# Patient Record
Sex: Female | Born: 1950
Health system: Southern US, Community
[De-identification: ages and names within clinical notes are randomized; demographics above are authoritative.]

## PROBLEM LIST (undated history)

## (undated) DIAGNOSIS — F329 Major depressive disorder, single episode, unspecified: Secondary | ICD-10-CM

## (undated) DIAGNOSIS — D649 Anemia, unspecified: Secondary | ICD-10-CM

## (undated) DIAGNOSIS — H269 Unspecified cataract: Secondary | ICD-10-CM

## (undated) DIAGNOSIS — K7689 Other specified diseases of liver: Secondary | ICD-10-CM

## (undated) DIAGNOSIS — M549 Dorsalgia, unspecified: Secondary | ICD-10-CM

## (undated) DIAGNOSIS — K219 Gastro-esophageal reflux disease without esophagitis: Secondary | ICD-10-CM

## (undated) DIAGNOSIS — E785 Hyperlipidemia, unspecified: Secondary | ICD-10-CM

## (undated) DIAGNOSIS — M949 Disorder of cartilage, unspecified: Secondary | ICD-10-CM

## (undated) DIAGNOSIS — K5909 Other constipation: Secondary | ICD-10-CM

## (undated) DIAGNOSIS — I10 Essential (primary) hypertension: Secondary | ICD-10-CM

## (undated) DIAGNOSIS — M899 Disorder of bone, unspecified: Secondary | ICD-10-CM

## (undated) DIAGNOSIS — F411 Generalized anxiety disorder: Secondary | ICD-10-CM

## (undated) DIAGNOSIS — F3289 Other specified depressive episodes: Secondary | ICD-10-CM

## (undated) DIAGNOSIS — E669 Obesity, unspecified: Secondary | ICD-10-CM

## (undated) DIAGNOSIS — J189 Pneumonia, unspecified organism: Secondary | ICD-10-CM

## (undated) HISTORY — DX: Disorder of cartilage, unspecified: M94.9

## (undated) HISTORY — DX: Other specified diseases of liver: K76.89

## (undated) HISTORY — PX: TUBAL LIGATION: SHX77

## (undated) HISTORY — PX: CATARACT EXTRACTION: SUR2

## (undated) HISTORY — PX: APPENDECTOMY: SHX54

## (undated) HISTORY — DX: Dorsalgia, unspecified: M54.9

## (undated) HISTORY — DX: Essential (primary) hypertension: I10

## (undated) HISTORY — PX: OTHER SURGICAL HISTORY: SHX169

## (undated) HISTORY — DX: Other specified depressive episodes: F32.89

## (undated) HISTORY — PX: BREAST BIOPSY: SHX20

## (undated) HISTORY — PX: ABDOMINAL HYSTERECTOMY: SHX81

## (undated) HISTORY — DX: Obesity, unspecified: E66.9

## (undated) HISTORY — DX: Hyperlipidemia, unspecified: E78.5

## (undated) HISTORY — DX: Major depressive disorder, single episode, unspecified: F32.9

## (undated) HISTORY — DX: Pneumonia, unspecified organism: J18.9

## (undated) HISTORY — DX: Disorder of bone, unspecified: M89.9

## (undated) HISTORY — DX: Unspecified cataract: H26.9

## (undated) HISTORY — DX: Gastro-esophageal reflux disease without esophagitis: K21.9

## (undated) HISTORY — PX: CHOLECYSTECTOMY: SHX55

## (undated) HISTORY — DX: Other constipation: K59.09

## (undated) HISTORY — DX: Generalized anxiety disorder: F41.1

## (undated) HISTORY — DX: Anemia, unspecified: D64.9

---

## 1998-04-13 ENCOUNTER — Other Ambulatory Visit: Admission: RE | Admit: 1998-04-13 | Discharge: 1998-04-13 | Payer: Self-pay | Admitting: Obstetrics and Gynecology

## 2000-07-25 ENCOUNTER — Observation Stay (HOSPITAL_COMMUNITY): Admission: RE | Admit: 2000-07-25 | Discharge: 2000-07-26 | Payer: Self-pay | Admitting: Obstetrics and Gynecology

## 2000-07-25 ENCOUNTER — Encounter (INDEPENDENT_AMBULATORY_CARE_PROVIDER_SITE_OTHER): Payer: Self-pay | Admitting: Specialist

## 2001-04-04 ENCOUNTER — Observation Stay (HOSPITAL_COMMUNITY): Admission: EM | Admit: 2001-04-04 | Discharge: 2001-04-05 | Payer: Self-pay | Admitting: Internal Medicine

## 2001-04-04 ENCOUNTER — Encounter (INDEPENDENT_AMBULATORY_CARE_PROVIDER_SITE_OTHER): Payer: Self-pay | Admitting: Specialist

## 2001-04-04 ENCOUNTER — Encounter: Payer: Self-pay | Admitting: Emergency Medicine

## 2001-06-01 ENCOUNTER — Ambulatory Visit (HOSPITAL_BASED_OUTPATIENT_CLINIC_OR_DEPARTMENT_OTHER): Admission: RE | Admit: 2001-06-01 | Discharge: 2001-06-01 | Payer: Self-pay | Admitting: Orthopedic Surgery

## 2003-01-03 ENCOUNTER — Encounter: Payer: Self-pay | Admitting: Internal Medicine

## 2003-01-03 DIAGNOSIS — D126 Benign neoplasm of colon, unspecified: Secondary | ICD-10-CM | POA: Insufficient documentation

## 2003-01-03 HISTORY — PX: UPPER GASTROINTESTINAL ENDOSCOPY: SHX188

## 2003-01-03 HISTORY — PX: COLONOSCOPY W/ BIOPSIES AND POLYPECTOMY: SHX1376

## 2003-01-03 LAB — HM COLONOSCOPY

## 2004-02-14 ENCOUNTER — Emergency Department (HOSPITAL_COMMUNITY): Admission: EM | Admit: 2004-02-14 | Discharge: 2004-02-15 | Payer: Self-pay | Admitting: Emergency Medicine

## 2004-09-28 ENCOUNTER — Emergency Department (HOSPITAL_COMMUNITY): Admission: EM | Admit: 2004-09-28 | Discharge: 2004-09-29 | Payer: Self-pay | Admitting: Emergency Medicine

## 2005-04-27 ENCOUNTER — Ambulatory Visit: Payer: Self-pay | Admitting: Internal Medicine

## 2005-06-14 ENCOUNTER — Ambulatory Visit: Payer: Self-pay | Admitting: Family Medicine

## 2005-08-29 ENCOUNTER — Ambulatory Visit: Payer: Self-pay | Admitting: Internal Medicine

## 2005-09-05 ENCOUNTER — Ambulatory Visit: Payer: Self-pay | Admitting: Professional

## 2005-09-09 ENCOUNTER — Ambulatory Visit: Payer: Self-pay | Admitting: Internal Medicine

## 2005-11-18 ENCOUNTER — Ambulatory Visit: Payer: Self-pay | Admitting: Internal Medicine

## 2005-12-02 ENCOUNTER — Ambulatory Visit: Payer: Self-pay | Admitting: Internal Medicine

## 2005-12-13 ENCOUNTER — Encounter: Admission: RE | Admit: 2005-12-13 | Discharge: 2005-12-13 | Payer: Self-pay | Admitting: Unknown Physician Specialty

## 2006-01-04 ENCOUNTER — Ambulatory Visit (HOSPITAL_COMMUNITY): Admission: RE | Admit: 2006-01-04 | Discharge: 2006-01-04 | Payer: Self-pay | Admitting: Neurological Surgery

## 2006-03-01 ENCOUNTER — Ambulatory Visit: Payer: Self-pay | Admitting: Internal Medicine

## 2006-06-07 ENCOUNTER — Ambulatory Visit: Payer: Self-pay | Admitting: Internal Medicine

## 2006-07-10 ENCOUNTER — Ambulatory Visit: Payer: Self-pay | Admitting: Pulmonary Disease

## 2006-07-27 ENCOUNTER — Ambulatory Visit: Payer: Self-pay | Admitting: Internal Medicine

## 2006-07-27 HISTORY — PX: ELECTROCARDIOGRAM: SHX264

## 2006-08-18 ENCOUNTER — Ambulatory Visit: Payer: Self-pay | Admitting: Internal Medicine

## 2006-08-22 ENCOUNTER — Ambulatory Visit (HOSPITAL_BASED_OUTPATIENT_CLINIC_OR_DEPARTMENT_OTHER): Admission: RE | Admit: 2006-08-22 | Discharge: 2006-08-22 | Payer: Self-pay | Admitting: Pulmonary Disease

## 2006-08-27 ENCOUNTER — Ambulatory Visit: Payer: Self-pay | Admitting: Pulmonary Disease

## 2006-09-15 ENCOUNTER — Ambulatory Visit: Payer: Self-pay | Admitting: Pulmonary Disease

## 2006-09-26 ENCOUNTER — Ambulatory Visit: Payer: Self-pay | Admitting: Internal Medicine

## 2006-10-23 ENCOUNTER — Ambulatory Visit: Payer: Self-pay | Admitting: Pulmonary Disease

## 2006-10-26 ENCOUNTER — Ambulatory Visit: Payer: Self-pay | Admitting: Internal Medicine

## 2006-11-03 ENCOUNTER — Ambulatory Visit: Payer: Self-pay | Admitting: Internal Medicine

## 2006-11-17 ENCOUNTER — Ambulatory Visit: Payer: Self-pay | Admitting: Internal Medicine

## 2007-03-05 ENCOUNTER — Ambulatory Visit: Payer: Self-pay | Admitting: Internal Medicine

## 2007-05-24 ENCOUNTER — Ambulatory Visit: Payer: Self-pay | Admitting: Internal Medicine

## 2007-05-24 LAB — CONVERTED CEMR LAB
ALT: 27 units/L (ref 0–40)
Albumin: 3.4 g/dL — ABNORMAL LOW (ref 3.5–5.2)
BUN: 12 mg/dL (ref 6–23)
Basophils Absolute: 0 10*3/uL (ref 0.0–0.1)
Basophils Relative: 0.4 % (ref 0.0–1.0)
Creatinine, Ser: 0.8 mg/dL (ref 0.4–1.2)
Eosinophils Absolute: 0.1 10*3/uL (ref 0.0–0.6)
Eosinophils Relative: 2.6 % (ref 0.0–5.0)
GFR calc Af Amer: 95 mL/min
Hemoglobin, Urine: NEGATIVE
Hemoglobin: 13.3 g/dL (ref 12.0–15.0)
Leukocytes, UA: NEGATIVE
Lymphocytes Relative: 47.2 % — ABNORMAL HIGH (ref 12.0–46.0)
MCHC: 34.4 g/dL (ref 30.0–36.0)
Monocytes Absolute: 0.3 10*3/uL (ref 0.2–0.7)
RDW: 14.4 % (ref 11.5–14.6)
Total Bilirubin: 0.5 mg/dL (ref 0.3–1.2)
Total Protein, Urine: NEGATIVE mg/dL
Total Protein: 7.2 g/dL (ref 6.0–8.3)
WBC: 4.5 10*3/uL (ref 4.5–10.5)

## 2007-06-05 ENCOUNTER — Ambulatory Visit: Payer: Self-pay | Admitting: Family Medicine

## 2007-07-04 ENCOUNTER — Encounter: Payer: Self-pay | Admitting: Internal Medicine

## 2007-07-04 DIAGNOSIS — E785 Hyperlipidemia, unspecified: Secondary | ICD-10-CM | POA: Insufficient documentation

## 2007-09-01 ENCOUNTER — Ambulatory Visit: Payer: Self-pay | Admitting: Internal Medicine

## 2007-09-14 ENCOUNTER — Encounter: Admission: RE | Admit: 2007-09-14 | Discharge: 2007-09-14 | Payer: Self-pay | Admitting: Obstetrics and Gynecology

## 2007-10-04 ENCOUNTER — Ambulatory Visit: Payer: Self-pay | Admitting: Internal Medicine

## 2007-10-04 ENCOUNTER — Encounter: Payer: Self-pay | Admitting: Internal Medicine

## 2007-10-04 DIAGNOSIS — G4733 Obstructive sleep apnea (adult) (pediatric): Secondary | ICD-10-CM | POA: Insufficient documentation

## 2007-10-04 DIAGNOSIS — M949 Disorder of cartilage, unspecified: Secondary | ICD-10-CM

## 2007-10-04 DIAGNOSIS — F411 Generalized anxiety disorder: Secondary | ICD-10-CM | POA: Insufficient documentation

## 2007-10-04 DIAGNOSIS — M899 Disorder of bone, unspecified: Secondary | ICD-10-CM | POA: Insufficient documentation

## 2007-10-04 DIAGNOSIS — F32A Depression, unspecified: Secondary | ICD-10-CM | POA: Insufficient documentation

## 2007-10-04 DIAGNOSIS — I491 Atrial premature depolarization: Secondary | ICD-10-CM | POA: Insufficient documentation

## 2007-10-04 DIAGNOSIS — F329 Major depressive disorder, single episode, unspecified: Secondary | ICD-10-CM | POA: Insufficient documentation

## 2007-10-04 DIAGNOSIS — I10 Essential (primary) hypertension: Secondary | ICD-10-CM | POA: Insufficient documentation

## 2008-02-15 ENCOUNTER — Encounter: Payer: Self-pay | Admitting: Internal Medicine

## 2008-02-22 ENCOUNTER — Telehealth: Payer: Self-pay | Admitting: Internal Medicine

## 2008-02-23 ENCOUNTER — Emergency Department (HOSPITAL_COMMUNITY): Admission: EM | Admit: 2008-02-23 | Discharge: 2008-02-23 | Payer: Self-pay | Admitting: Family Medicine

## 2008-04-01 ENCOUNTER — Telehealth: Payer: Self-pay | Admitting: Internal Medicine

## 2008-04-14 ENCOUNTER — Encounter: Payer: Self-pay | Admitting: Internal Medicine

## 2008-04-15 ENCOUNTER — Ambulatory Visit: Payer: Self-pay | Admitting: Internal Medicine

## 2008-04-16 ENCOUNTER — Encounter: Payer: Self-pay | Admitting: Internal Medicine

## 2008-04-16 DIAGNOSIS — K7689 Other specified diseases of liver: Secondary | ICD-10-CM | POA: Insufficient documentation

## 2008-04-17 LAB — CONVERTED CEMR LAB
ALT: 113 units/L — ABNORMAL HIGH (ref 0–35)
Basophils Absolute: 0.1 10*3/uL (ref 0.0–0.1)
Basophils Relative: 1.2 % — ABNORMAL HIGH (ref 0.0–1.0)
Bilirubin, Direct: 0.2 mg/dL (ref 0.0–0.3)
CO2: 27 meq/L (ref 19–32)
Calcium: 9.4 mg/dL (ref 8.4–10.5)
Chloride: 105 meq/L (ref 96–112)
Cholesterol: 192 mg/dL (ref 0–200)
Creatinine, Ser: 0.9 mg/dL (ref 0.4–1.2)
Crystals: NEGATIVE
Eosinophils Relative: 2.2 % (ref 0.0–5.0)
GFR calc Af Amer: 83 mL/min
Glucose, Bld: 92 mg/dL (ref 70–99)
HDL: 63.3 mg/dL (ref >39.0)
Hemoglobin, Urine: NEGATIVE
LDL Cholesterol: 101 mg/dL — ABNORMAL HIGH (ref 0–99)
MCHC: 33.4 g/dL (ref 30.0–36.0)
MCV: 91.5 fL (ref 78.0–100.0)
Neutro Abs: 3.3 10*3/uL (ref 1.4–7.7)
Nitrite: NEGATIVE
Platelets: 279 10*3/uL (ref 150–400)
RBC / HPF: NONE SEEN
Sodium: 144 meq/L (ref 135–145)
Total Bilirubin: 0.7 mg/dL (ref 0.3–1.2)
Total Protein: 8 g/dL (ref 6.0–8.3)
Triglycerides: 140 mg/dL (ref 0–149)
Urobilinogen, UA: 0.2 (ref 0.0–1.0)
WBC: 6 10*3/uL (ref 4.5–10.5)

## 2008-04-22 ENCOUNTER — Encounter: Admission: RE | Admit: 2008-04-22 | Discharge: 2008-04-22 | Payer: Self-pay | Admitting: Internal Medicine

## 2008-04-22 ENCOUNTER — Ambulatory Visit: Payer: Self-pay | Admitting: Internal Medicine

## 2008-04-23 ENCOUNTER — Encounter: Payer: Self-pay | Admitting: Internal Medicine

## 2008-04-24 ENCOUNTER — Encounter (INDEPENDENT_AMBULATORY_CARE_PROVIDER_SITE_OTHER): Payer: Self-pay | Admitting: *Deleted

## 2008-04-24 LAB — CONVERTED CEMR LAB
ALT: 123 units/L — ABNORMAL HIGH (ref 0–35)
AST: 189 units/L — ABNORMAL HIGH (ref 0–37)
Albumin: 3.8 g/dL (ref 3.5–5.2)
Total Protein: 7.9 g/dL (ref 6.0–8.3)

## 2008-05-21 ENCOUNTER — Ambulatory Visit: Payer: Self-pay | Admitting: Internal Medicine

## 2008-05-21 DIAGNOSIS — E669 Obesity, unspecified: Secondary | ICD-10-CM | POA: Insufficient documentation

## 2008-05-21 DIAGNOSIS — K5909 Other constipation: Secondary | ICD-10-CM | POA: Insufficient documentation

## 2008-05-21 LAB — CONVERTED CEMR LAB
ALT: 171 units/L — ABNORMAL HIGH (ref 0–35)
AST: 186 units/L — ABNORMAL HIGH (ref 0–37)
Alkaline Phosphatase: 99 units/L (ref 39–117)
Bilirubin, Direct: 0.1 mg/dL (ref 0.0–0.3)

## 2008-05-28 LAB — CONVERTED CEMR LAB: Anti Nuclear Antibody(ANA): NEGATIVE

## 2008-06-13 ENCOUNTER — Ambulatory Visit: Payer: Self-pay | Admitting: Internal Medicine

## 2008-06-16 LAB — CONVERTED CEMR LAB
AST: 124 units/L — ABNORMAL HIGH (ref 0–37)
Albumin: 3.7 g/dL (ref 3.5–5.2)
Bilirubin, Direct: 0.1 mg/dL (ref 0.0–0.3)
Total CK: 105 units/L (ref 7–177)
Total Protein: 7.7 g/dL (ref 6.0–8.3)

## 2008-07-18 ENCOUNTER — Ambulatory Visit: Payer: Self-pay | Admitting: Internal Medicine

## 2008-07-20 LAB — CONVERTED CEMR LAB
ALT: 171 units/L — ABNORMAL HIGH (ref 0–35)
AST: 213 units/L — ABNORMAL HIGH (ref 0–37)
Alkaline Phosphatase: 81 units/L (ref 39–117)
Bilirubin, Direct: 0.1 mg/dL (ref 0.0–0.3)
Cholesterol: 293 mg/dL (ref 0–200)
Glucose, Bld: 100 mg/dL — ABNORMAL HIGH (ref 70–99)
Total CHOL/HDL Ratio: 4.7
VLDL: 24 mg/dL (ref 0–40)

## 2008-07-22 ENCOUNTER — Ambulatory Visit: Payer: Self-pay | Admitting: Internal Medicine

## 2008-07-22 LAB — CONVERTED CEMR LAB
Basophils Relative: 0.5 % (ref 0.0–3.0)
HCT: 40.3 % (ref 36.0–46.0)
MCHC: 34.6 g/dL (ref 30.0–36.0)
Monocytes Absolute: 0.3 10*3/uL (ref 0.1–1.0)
Neutro Abs: 1.9 10*3/uL (ref 1.4–7.7)
Neutrophils Relative %: 44 % (ref 43.0–77.0)
Platelets: 272 10*3/uL (ref 150–400)
Prothrombin Time: 12.8 s (ref 10.9–13.3)
RDW: 14.2 % (ref 11.5–14.6)
aPTT: 30.5 s — ABNORMAL HIGH (ref 21.7–29.8)

## 2008-07-28 ENCOUNTER — Telehealth: Payer: Self-pay | Admitting: Internal Medicine

## 2008-07-28 LAB — CONVERTED CEMR LAB
Albumin ELP: 53.9 % — ABNORMAL LOW (ref 55.8–66.1)
Alpha-2-Globulin: 10.7 % (ref 7.1–11.8)
Gamma Globulin: 17.3 % (ref 11.1–18.8)
Tissue Transglutaminase Ab, IgA: 0.9 units (ref <7)
Total Protein, Serum Electrophoresis: 7.8 g/dL (ref 6.0–8.3)

## 2008-08-27 ENCOUNTER — Ambulatory Visit: Payer: Self-pay | Admitting: Internal Medicine

## 2008-08-28 LAB — CONVERTED CEMR LAB
AST: 173 units/L — ABNORMAL HIGH (ref 0–37)
Total Bilirubin: 0.7 mg/dL (ref 0.3–1.2)

## 2008-09-01 ENCOUNTER — Encounter: Payer: Self-pay | Admitting: Internal Medicine

## 2008-09-08 ENCOUNTER — Encounter: Admission: RE | Admit: 2008-09-08 | Discharge: 2008-09-08 | Payer: Self-pay | Admitting: Obstetrics and Gynecology

## 2008-10-14 ENCOUNTER — Ambulatory Visit: Payer: Self-pay | Admitting: Internal Medicine

## 2008-10-14 DIAGNOSIS — K648 Other hemorrhoids: Secondary | ICD-10-CM | POA: Insufficient documentation

## 2008-10-14 LAB — CONVERTED CEMR LAB: Alkaline Phosphatase: 84 units/L (ref 39–117)

## 2008-10-29 ENCOUNTER — Ambulatory Visit: Payer: Self-pay | Admitting: Internal Medicine

## 2008-10-29 DIAGNOSIS — E039 Hypothyroidism, unspecified: Secondary | ICD-10-CM | POA: Insufficient documentation

## 2008-10-30 LAB — CONVERTED CEMR LAB
Albumin: 3.6 g/dL (ref 3.5–5.2)
Total Bilirubin: 0.7 mg/dL (ref 0.3–1.2)

## 2008-11-10 ENCOUNTER — Encounter (INDEPENDENT_AMBULATORY_CARE_PROVIDER_SITE_OTHER): Payer: Self-pay | Admitting: Interventional Radiology

## 2008-11-10 ENCOUNTER — Encounter: Payer: Self-pay | Admitting: Internal Medicine

## 2008-11-10 ENCOUNTER — Ambulatory Visit (HOSPITAL_COMMUNITY): Admission: RE | Admit: 2008-11-10 | Discharge: 2008-11-10 | Payer: Self-pay | Admitting: Internal Medicine

## 2008-11-17 ENCOUNTER — Telehealth: Payer: Self-pay | Admitting: Internal Medicine

## 2008-11-24 ENCOUNTER — Ambulatory Visit: Payer: Self-pay | Admitting: Internal Medicine

## 2009-02-04 ENCOUNTER — Ambulatory Visit: Payer: Self-pay | Admitting: Internal Medicine

## 2009-02-05 LAB — CONVERTED CEMR LAB
ALT: 45 units/L — ABNORMAL HIGH (ref 0–35)
AST: 64 units/L — ABNORMAL HIGH (ref 0–37)
Albumin: 3.9 g/dL (ref 3.5–5.2)
Alkaline Phosphatase: 76 units/L (ref 39–117)
Total Protein: 8 g/dL (ref 6.0–8.3)

## 2009-02-24 ENCOUNTER — Telehealth: Payer: Self-pay | Admitting: Internal Medicine

## 2009-03-02 ENCOUNTER — Ambulatory Visit: Payer: Self-pay | Admitting: Internal Medicine

## 2009-07-15 ENCOUNTER — Ambulatory Visit: Payer: Self-pay | Admitting: Internal Medicine

## 2009-07-15 ENCOUNTER — Encounter: Payer: Self-pay | Admitting: Internal Medicine

## 2009-07-17 LAB — CONVERTED CEMR LAB
Alkaline Phosphatase: 95 units/L (ref 39–117)
Bilirubin, Direct: 0.1 mg/dL (ref 0.0–0.3)
Total Bilirubin: 0.9 mg/dL (ref 0.3–1.2)

## 2009-09-14 ENCOUNTER — Encounter: Admission: RE | Admit: 2009-09-14 | Discharge: 2009-09-14 | Payer: Self-pay | Admitting: Obstetrics and Gynecology

## 2009-09-22 ENCOUNTER — Ambulatory Visit: Payer: Self-pay | Admitting: Internal Medicine

## 2009-09-22 DIAGNOSIS — M545 Low back pain, unspecified: Secondary | ICD-10-CM | POA: Insufficient documentation

## 2009-09-22 LAB — CONVERTED CEMR LAB
AST: 39 units/L — ABNORMAL HIGH (ref 0–37)
Albumin: 3.7 g/dL (ref 3.5–5.2)
Basophils Relative: 0.4 % (ref 0.0–3.0)
Bilirubin Urine: NEGATIVE
CO2: 31 meq/L (ref 19–32)
Chloride: 100 meq/L (ref 96–112)
Creatinine, Ser: 0.9 mg/dL (ref 0.4–1.2)
GFR calc non Af Amer: 68.06 mL/min (ref >60)
HDL: 57.9 mg/dL (ref >39.00)
Ketones, ur: NEGATIVE mg/dL
Monocytes Absolute: 0.4 10*3/uL (ref 0.1–1.0)
Monocytes Relative: 7.3 % (ref 3.0–12.0)
Neutrophils Relative %: 45.4 % (ref 43.0–77.0)
Nitrite: NEGATIVE
Potassium: 3.5 meq/L (ref 3.5–5.1)
RBC: 4.34 M/uL (ref 3.87–5.11)
Sodium: 136 meq/L (ref 135–145)
Specific Gravity, Urine: <=1.005 (ref 1.000–1.030)
TSH: 3.16 microintl units/mL (ref 0.35–5.50)
Total Bilirubin: 0.7 mg/dL (ref 0.3–1.2)
Total Protein, Urine: NEGATIVE mg/dL
Total Protein: 8.3 g/dL (ref 6.0–8.3)
pH: 7 (ref 5.0–8.0)

## 2009-11-09 ENCOUNTER — Telehealth: Payer: Self-pay | Admitting: Internal Medicine

## 2009-12-28 ENCOUNTER — Telehealth: Payer: Self-pay | Admitting: Internal Medicine

## 2010-06-10 ENCOUNTER — Telehealth: Payer: Self-pay | Admitting: Internal Medicine

## 2010-09-15 ENCOUNTER — Encounter: Admission: RE | Admit: 2010-09-15 | Discharge: 2010-09-15 | Payer: Self-pay | Admitting: Obstetrics and Gynecology

## 2010-09-21 ENCOUNTER — Encounter: Payer: Self-pay | Admitting: Internal Medicine

## 2010-09-21 ENCOUNTER — Ambulatory Visit: Payer: Self-pay | Admitting: Internal Medicine

## 2010-09-21 DIAGNOSIS — L723 Sebaceous cyst: Secondary | ICD-10-CM | POA: Insufficient documentation

## 2010-09-21 DIAGNOSIS — R21 Rash and other nonspecific skin eruption: Secondary | ICD-10-CM | POA: Insufficient documentation

## 2010-09-21 LAB — CONVERTED CEMR LAB
Alkaline Phosphatase: 83 units/L (ref 39–117)
BUN: 13 mg/dL (ref 6–23)
Bilirubin, Direct: 0.1 mg/dL (ref 0.0–0.3)
Cholesterol: 260 mg/dL — ABNORMAL HIGH (ref 0–200)
Direct LDL: 179.7 mg/dL
Eosinophils Absolute: 0.2 10*3/uL (ref 0.0–0.7)
Eosinophils Relative: 3 % (ref 0.0–5.0)
HDL: 61.8 mg/dL (ref >39.00)
Ketones, ur: NEGATIVE mg/dL
Lymphocytes Relative: 36.1 % (ref 12.0–46.0)
Neutro Abs: 3 10*3/uL (ref 1.4–7.7)
Neutrophils Relative %: 53.6 % (ref 43.0–77.0)
Potassium: 4.5 meq/L (ref 3.5–5.1)
Saturation Ratios: 8.8 % — ABNORMAL LOW (ref 20.0–50.0)
Specific Gravity, Urine: >=1.03 (ref 1.000–1.030)
TSH: 3.41 microintl units/mL (ref 0.35–5.50)
Total CHOL/HDL Ratio: 4
Transferrin: 396.9 mg/dL — ABNORMAL HIGH (ref 212.0–360.0)
Triglycerides: 184 mg/dL — ABNORMAL HIGH (ref 0.0–149.0)
Urobilinogen, UA: 0.2 (ref 0.0–1.0)
pH: 5.5 (ref 5.0–8.0)

## 2011-01-20 NOTE — Progress Notes (Signed)
  Phone Note Refill Request Message from:  Fax from Pharmacy on June 10, 2010 2:19 PM  Refills Requested: Medication #1:  HYDROCHLOROTHIAZIDE 25 MG  TABS 1 by mouth once daily   Dosage confirmed as above?Dosage Confirmed   Last Refilled: 09/22/2009   Notes: [facility]  Medication #2:  METOPROLOL SUCCINATE 50 MG TB24 Take 1 tablet by mouth once a day   Dosage confirmed as above?Dosage Confirmed   Last Refilled: 09/22/2009   Notes: [facility]  Medication #3:  CRESTOR 20 MG TABS 1 by mouth once daily   Dosage confirmed as above?Dosage Confirmed   Last Refilled: 09/22/2009   Notes: [facility] Initial call taken by: Robin Ewing CMA (AAMA),  June 10, 2010 2:20 PM    Prescriptions: CRESTOR 20 MG TABS (ROSUVASTATIN CALCIUM) 1 by mouth once daily  #90 x 0   Entered by:   Scharlene Gloss CMA (AAMA)   Authorized by:   Corwin Levins MD   Signed by:   Scharlene Gloss CMA (AAMA) on 06/10/2010   Method used:   Faxed to ...       [facility] (mail-order)             , [place]         Ph: [phone]       Fax: [phone]   RxID:   [id number] METOPROLOL SUCCINATE 50 MG TB24 (METOPROLOL SUCCINATE) Take 1 tablet by mouth once a day  #90 x 0   Entered by:   Zella Ball Ewing CMA (AAMA)   Authorized by:   Corwin Levins MD   Signed by:   Scharlene Gloss CMA (AAMA) on 06/10/2010   Method used:   Faxed to ...       [facility] (mail-order)             , [place]         Ph: [phone]       Fax: [phone]   RxID:   [id number] HYDROCHLOROTHIAZIDE 25 MG  TABS (HYDROCHLOROTHIAZIDE) 1 by mouth once daily  #90 x 0   Entered by:   Zella Ball Ewing CMA (AAMA)   Authorized by:   Corwin Levins MD   Signed by:   Scharlene Gloss CMA (AAMA) on 06/10/2010   Method used:   Faxed to ...       [facility] (mail-[facility])             , [place]         Ph: [phone]       Fax: [phone]   RxID:   [id number]

## 2011-01-20 NOTE — Assessment & Plan Note (Signed)
Summary: YEARLY MEDICARE CPX-FLU SHOT-SHINGLES SHOT-LB   Vital Signs:  Patient profile:   60 year old female Height:      63 inches Weight:      214.50 pounds BMI:     38.13 O2 Sat:      92 % on Room air Temp:     96.5 degrees F oral Pulse rate:   70 / minute BP sitting:   130 / 82  (left arm) Cuff size:   large  Vitals Entered By: Zella Ball Ewing CMA Duncan Dull) (September 21, 2010 8:55 AM)  O2 Flow:  Room air  Preventive Care Screening  Colonoscopy:    Next Due:  01/2013  Bone Density:    Date:  07/15/2009    Next Due:  07/2011    Results:  abnormal std dev  Mammogram:    Date:  09/13/2010    Results:  normal   CC: ADult Physical/RE/wellness   Primary Care Leevon Upperman:  Oliver Barre, MD  CC:  ADult Physical/RE/wellness.  History of Present Illness: here for wellness and f/u -  has ongoing rash to left cheek for one yr that she keeps aggrevating the face with scratching, as well as rash to the mild lower back that gets owrse to the warmer weather and sweating;  trying to be more active lately but no wt loss yet - infact may have gained a few lbs;  Pt denies CP, worsening sob, doe, wheezing, orthopnea, pnd, worsening LE edema, palps, dizziness or syncope  Pt denies new neuro symptoms such as headache, facial or extremity weakness  Pt denies polydipsia, polyuria.  Overall good compliance with meds, trying to follow low chol diet, wt stable, little excercise however   Also with ? small boil near the left axilla, wihtout fever , chills, malaise.  Has occasional nightmares  at night where she will flail, has to be held down as her 'legs keep running"  - only happens 4 times in the pasat 4 yrs.  Also seems to scream but doesnot wake up.  Denies worsening depression, suicidal ideation, or panic.  Last panic attack has been several yrs ago.  Has not been taking the boniva lately - too wary of the side effect.    Here for wellness Diet: Heart Healthy or DM if diabetic Physical Activities:  Sedentary, except for walking occasionally Depression/mood screen: Negative/stable on meds Hearing: Intact bilateral Visual Acuity: Grossly normal, gets exam yearly,   ADL's: Capable  Fall Risk: None Home Safety: Good Cognitive Impairment:  Gen appearance, affect, speech, memory, attention & motor skills grossly intact End-of-Life Planning: Advance directive - Full code/I agree   Problems Prior to Update: 1)  Low Back Pain  (ICD-724.2) 2)  Hypothyroidism  (ICD-244.9) 3)  Rectal Bleeding  (ICD-569.3) 4)  Obesity, Unspecified  (ICD-278.00) 5)  Abnormal Transaminase-lft's  (ICD-790.4) 6)  Constipation, Chronic  (ICD-564.09) 7)  Colonic Polyps, Hyperplastic  (ICD-211.3) 8)  External Hemorrhoids  (ICD-455.3) 9)  Nash (NON-ALCOHOLIC STEATOHEPATITIS)  (ICD-571.8) 10)  Back Pain  (ICD-724.5) 11)  Preventive Health Care  (ICD-V70.0) 12)  Hypertension  (ICD-401.9) 13)  Osteopenia  (ICD-733.90) 14)  Premature Beats, Atrial  (ICD-427.61) 15)  Obstructive Sleep Apnea  (ICD-327.23) 16)  Anxiety  (ICD-300.00) 17)  Depression  (ICD-311) 18)  Family History of Alcoholism/addiction  (ICD-V61.41) 19)  Hyperlipidemia  (ICD-272.4)  Medications Prior to Update: 1)  Effexor Xr 150 Mg  Cp24 (Venlafaxine Hcl) .... Take 1 By Mouth Once Daily 2)  Boniva 150 Mg  Tabs (Ibandronate Sodium) .... Take 1 By Mouth Q Month 3)  Hydrochlorothiazide 25 Mg  Tabs (Hydrochlorothiazide) .Marland Kitchen.. 1 By Mouth Once Daily 4)  Metoprolol Succinate 50 Mg Tb24 (Metoprolol Succinate) .... Take 1 Tablet By Mouth Once A Day 5)  Bayer Aspirin Ec Low Dose 81 Mg  Tbec (Aspirin) .... Take 1 Tablet By Mouth Once A Day 6)  Fish Oil   Oil (Fish Oil) .... Take 1 Tablets By Mouth Once Daily. 7)  Vitamin B-12 1000 Mcg  Tabs (Cyanocobalamin) .... 2  Tablet By Mouth Once Daily 8)  Calcium Carbonate 600 Mg  Tabs (Calcium Carbonate) .Marland Kitchen.. 1 Tablet By Mouth Two Times A Day 9)  Vitamin D 09811 Unit Caps (Ergocalciferol) .Marland Kitchen.. 1 Capsule Every  Week 10)  Acai 500 Mg Caps (Acai) .... Take 2 By Mouth Once Daily 11)  Coq10 Maximum Strength 400 Mg Caps (Coenzyme Q10) .Marland Kitchen.. 1 By Mouth Every Day 12)  Crestor 20 Mg Tabs (Rosuvastatin Calcium) .Marland Kitchen.. 1 By Mouth Once Daily 13)  Alprazolam 0.25 Mg Tabs (Alprazolam) .Marland Kitchen.. 1 By Mouth Two Times A Day As Needed  Current Medications (verified): 1)  Effexor Xr 150 Mg  Cp24 (Venlafaxine Hcl) .... Take 1 By Mouth Once Daily 2)  Hydrochlorothiazide 25 Mg  Tabs (Hydrochlorothiazide) .Marland Kitchen.. 1 By Mouth Once Daily 3)  Metoprolol Succinate 50 Mg Tb24 (Metoprolol Succinate) .... Take 1 Tablet By Mouth Once A Day 4)  Bayer Aspirin Ec Low Dose 81 Mg  Tbec (Aspirin) .... Take 1 Tablet By Mouth Once A Day 5)  Fish Oil   Oil (Fish Oil) .... Take 1 Tablets By Mouth Once Daily. 6)  Calcium Carbonate 600 Mg  Tabs (Calcium Carbonate) .Marland Kitchen.. 1 Tablet By Mouth Two Times A Day 7)  Crestor 20 Mg Tabs (Rosuvastatin Calcium) .Marland Kitchen.. 1 By Mouth Once Daily 8)  Alprazolam 0.25 Mg Tabs (Alprazolam) .Marland Kitchen.. 1 By Mouth Two Times A Day As Needed 9)  Vitamin D3 1000 Unit Caps (Cholecalciferol) .Marland Kitchen.. 1 By Mouth Once Daily 10)  Lotrisone 1-0.05 % Crea (Clotrimazole-Betamethasone) .... Use Asd Two Times A Day As Needed  Allergies (verified): 1)  ! 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   Risk Factors: Exercise: no (05/21/2008)  Risk Factors: Smoking Status: quit (10/04/2007)  Past Medical History: Hyperlipidemia Depression Anxiety OSA symptomatic  PAC's Osteopenia Hypertension Hypothyroidism low vit D NASH (fatty liver) Low back pain - chronic Anemia-NOS Colonic polyps, hx of - benign  MD Roster:  Dr Leone Payor - GI                     Derm - Dr Terri Piedra                    GYN - Dr Elana Alm                    optho - in [place], [place] - Dr Lucinda Dell - Dr Shelle Iron  Past Surgical History: Hysterectomy EGD (01/03/2003)  EKG (07/27/2006) Appendectomy Cholecystectomy Tubal Ligation Trigger finger repair-Left thumb Cataract Surgery-left eye, and right eye s/p lumbar surgury  s/p liver biopsy - Dr Leone Payor  Family History: Reviewed history from 07/22/2008 and no changes required. Family History of Alcoholism/Addiction Family History High cholesterol Family History Hypertension No FH of Colon Cancer Family History of Heart Disease: Father No liver disease  Social History: Reviewed history from 07/22/2008 and no changes required. 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   Review of Systems  The patient denies anorexia, fever, weight loss, weight gain, vision loss, decreased hearing, hoarseness, chest pain, syncope, dyspnea on exertion, peripheral edema, prolonged cough, headaches, hemoptysis, abdominal pain, melena, hematochezia, severe indigestion/heartburn, hematuria, muscle weakness, transient blindness, difficulty walking, depression, unusual weight change, abnormal bleeding, enlarged lymph nodes, and angioedema.         all otherwise negative per pt -  except for fatigue without OSA symtpoms  Physical Exam  General:  alert and overweight-appearing.   Head:  normocephalic and atraumatic.   Eyes:  vision grossly intact, pupils equal, and pupils round.   Ears:  R ear normal and L ear normal.   Nose:  no external deformity and no nasal discharge.    Mouth:  no gingival abnormalities and pharynx pink and moist.   Neck:  supple and no masses.   Lungs:  normal respiratory effort and normal breath sounds.   Heart:  normal rate and regular rhythm.   Abdomen:  soft, non-tender, and normal bowel sounds.   Msk:  no joint tenderness and no joint swelling.   Extremities:  no edema, no erythema  Neurologic:  cranial nerves II-XII intact and strength normal in all extremities.   Skin:  color normal.  excoriated rash to left cheek below the eye, as well as tinea type rash to lower lumbar bilat para vertebral area;  also 1 cm area inflamed seb cyst type lesion to left mid lat chest near the left axilla, tedner but nonfluctuant Psych:  not depressed appearing and moderately anxious.     Impression & Recommendations:  Problem # 1:  Preventive Health Care (ICD-V70.0) Overall doing well, age appropriate education and counseling updated and referral for appropriate preventive services done unless declined, immunizations up to date or declined, diet counseling done if overweight, urged to quit smoking if smokes , most recent labs reviewed and current ordered if appropriate, ecg reviewed or declined (interpretation per ECG scanned in the EMR if done); information regarding Medicare Prevention requirements given if appropriate; speciality referrals updated as appropriate  Orders: EKG w/ Interpretation (93000) EKG w/ Interpretation (93000) Medicare -1st Annual Wellness Visit [phone])  Problem # 2:  SEBACEOUS CYST, INFECTED (ICD-706.2)  ok to refer to gen surgury -   Orders: Surgical Referral (Surgery)  Problem # 3:  RASH-NONVESICULAR (ICD-782.1)  Her updated medication list for this problem includes:    Lotrisone 1-0.05 % Crea (Clotrimazole-betamethasone) ..... Use asd two times a day as needed treat as above, f/u any worsening signs or symptoms  - should help lfef face/cheek and lower back   Problem # 4:  HYPOTHYROIDISM (ICD-244.9)  Labs  Reviewed: TSH: 3.16 (09/22/2009)    Chol: 183 (09/22/2009)   HDL: 57.90 (09/22/2009)   LDL: 85 (09/22/2009)   TG: 199.0 (09/22/2009) .not currently on meds and asympt - to check TSH  Problem #  5:  HYPERTENSION (ICD-401.9)  Her updated medication list for this problem includes:    Hydrochlorothiazide 25 Mg Tabs (Hydrochlorothiazide) .Marland Kitchen... 1 by mouth once daily    Metoprolol Succinate 50 Mg Tb24 (Metoprolol succinate) .Marland Kitchen... Take 1 tablet by mouth once a day  BP today: 130/82 Prior BP: 128/88 (09/22/2009)  Labs Reviewed: K+: 3.5 (09/22/2009) Creat: : 0.9 (09/22/2009)   Chol: 183 (09/22/2009)   HDL: 57.90 (09/22/2009)   LDL: 85 (09/22/2009)   TG: 199.0 (09/22/2009) stable overall by hx and exam, ok to continue meds/tx as is   Orders: Prescription Created Electronically [phone]) TLB-Udip ONLY (81003-UDIP)  Problem # 6:  DEPRESSION (ICD-311)  Her updated medication list for this problem includes:    Effexor Xr 150 Mg Cp24 (Venlafaxine hcl) .Marland Kitchen... Take 1 by mouth once daily    Alprazolam 0.25 Mg Tabs (Alprazolam) .Marland Kitchen... 1 by mouth two times a day as needed  stable overall by hx and exam, ok to continue meds/tx as is   Problem # 7:  OSTEOPENIA (ICD-733.90)  The following medications were removed from the medication list:    Boniva 150 Mg Tabs (Ibandronate sodium) .Marland Kitchen... Take 1 by mouth q month d/w pt - went over last dxa with pt, declines further tx at this time beyond ca/ vit d, to f/u dxa next yr as planned  Problem # 8:  FATIGUE (ICD-780.79)  exam benign, to check labs below; follow with expectant management   Orders: TLB-BMP (Basic Metabolic Panel-BMET) (80048-METABOL) TLB-CBC Platelet - w/Differential (85025-CBCD) TLB-Hepatic/Liver Function Pnl (80076-HEPATIC) TLB-TSH (Thyroid Stimulating Hormone) (84443-TSH) TLB-Sedimentation Rate (ESR) (85652-ESR)  Complete Medication List: 1)  Effexor Xr 150 Mg Cp24 (Venlafaxine hcl) .... Take 1 by mouth once daily 2)   Hydrochlorothiazide 25 Mg Tabs (Hydrochlorothiazide) .Marland Kitchen.. 1 by mouth once daily 3)  Metoprolol Succinate 50 Mg Tb24 (Metoprolol succinate) .... Take 1 tablet by mouth once a day 4)  Bayer Aspirin Ec Low Dose 81 Mg Tbec (Aspirin) .... Take 1 tablet by mouth once a day 5)  Fish Oil Oil (Fish oil) .... Take 1 tablets by mouth once daily. 6)  Calcium Carbonate 600 Mg Tabs (Calcium carbonate) .Marland Kitchen.. 1 tablet by mouth two times a day 7)  Crestor 20 Mg Tabs (Rosuvastatin calcium) .Marland Kitchen.. 1 by mouth once daily 8)  Alprazolam 0.25 Mg Tabs (Alprazolam) .Marland Kitchen.. 1 by mouth two times a day as needed 9)  Vitamin D3 1000 Unit Caps (Cholecalciferol) .Marland Kitchen.. 1 by mouth once daily 10)  Lotrisone 1-0.05 % Crea (Clotrimazole-betamethasone) .... Use asd two times a day as needed  Other Orders: Flu Vaccine 69yrs + MEDICARE PATIENTS (J8119) Administration Flu vaccine - MCR (G0008) TLB-IBC Pnl (Iron/FE;Transferrin) (83550-IBC) TLB-B12 + Folate Pnl (82746_82607-B12/FOL) TLB-Lipid Panel (80061-LIPID)  Patient Instructions: 1)  you had the flu shot today 2)  you had the tetanus shot today 3)  Your EKG was good today 4)  Please call the secondary insurance to find out if the shingles shot is covered; and if so, please make Nurse Visit for the shot 5)  You will be contacted about the referral(s) to: Surgury 6)  Please take all new medications as prescribed  7)  Continue all previous medications as before this visit 8)  Please go to the Lab in the basement for your blood and/or urine tests today  9)  Please call the number on the [facility] Card for results of your testing  10)  Please schedule a follow-up appointment in 6 months, or sooner if needed Prescriptions:  LOTRISONE 1-0.05 % CREA (CLOTRIMAZOLE-BETAMETHASONE) use asd two times a day as needed  #1 x 1   Entered and Authorized by:   Corwin Levins MD   Signed by:   Corwin Levins MD on 09/21/2010   Method used:   Print then Give to Patient   RxID:   [id number] ALPRAZOLAM  0.25 MG TABS (ALPRAZOLAM) 1 by mouth two times a day as needed  #60 x 1   Entered and Authorized by:   Corwin Levins MD   Signed by:   Corwin Levins MD on 09/21/2010   Method used:   Print then Give to Patient   RxID:   [id number] CRESTOR 20 MG TABS (ROSUVASTATIN CALCIUM) 1 by mouth once daily  #90 x 3   Entered and Authorized by:   Corwin Levins MD   Signed by:   Corwin Levins MD on 09/21/2010   Method used:   Print then Give to Patient   RxID:   [id number] METOPROLOL SUCCINATE 50 MG TB24 (METOPROLOL SUCCINATE) Take 1 tablet by mouth once a day  #90 x 3   Entered and Authorized by:   Corwin Levins MD   Signed by:   Corwin Levins MD on 09/21/2010   Method used:   Print then Give to Patient   RxID:   [id number] HYDROCHLOROTHIAZIDE 25 MG  TABS (HYDROCHLOROTHIAZIDE) 1 by mouth once daily  #90 x 3   Entered and Authorized by:   Corwin Levins MD   Signed by:   Corwin Levins MD on 09/21/2010   Method used:   Print then Give to Patient   RxID:   [id number] EFFEXOR XR 150 MG  CP24 (VENLAFAXINE HCL) TAKE 1 by mouth once daily  #90 x 3   Entered and Authorized by:   Corwin Levins MD   Signed by:   Corwin Levins MD on 09/21/2010   Method used:   Print then Give to Patient   RxID:   [id number]    Flu Vaccine Consent Questions     Do you have a history of severe allergic reactions to this vaccine? no    Any prior history of allergic reactions to egg and/or gelatin? no    Do you have a sensitivity to the preservative Thimersol? no    Do you have a past history of Guillan-Barre Syndrome? no    Do you currently have an acute febrile illness? no    Have you ever had a severe reaction to latex? no    Vaccine information given and explained to patient? yes    Are you currently pregnant? no    Lot Number:[id number]   Exp Date:06/18/2011   Site Given  Left Deltoid IMflu  Appended Document: Immunization Entry      Immunizations Administered:  Tetanus Vaccine:    Vaccine  Type: Tdap    Site: right deltoid    Mfr: GlaxoSmithKline    Dose: 0.5 ml    Route: IM    Given by: Zella Ball Ewing CMA (AAMA)    Exp. Date: 10/07/2012    Lot #: [id number]    VIS given: 11/05/08 version given September 21, 2010.  Appended Document: YEARLY MEDICARE CPX-FLU SHOT-SHINGLES SHOT-LB addendum:  cognitive intact to orientation, recall, naming, and repetition

## 2011-01-20 NOTE — Progress Notes (Signed)
Summary: Billing issue  Phone Note Call from Patient Call back at Home Phone [phone]   Summary of Call: Patient has called both [facility] billing and Medicare regarding her bill. Per the patient her visit labeled as "routine" needs to be labeled as a "follow up" so her inc will cover. Patient can be reached at home. (Dr. Jonny Ruiz patient) Initial call taken by: Lucious Groves,  December 28, 2009 11:35 AM  Follow-up for Phone Call        ok with me;  she apparently has insurance that does not cover prevention for 2010;    ok for change to routine level 3 - 401.1  to lou roland to help , please Follow-up by: Corwin Levins MD,  January 28, 2010 5:02 PM  Additional Follow-up for Phone Call Additional follow up Details #1::        Changed to routine visit and billed accordingly. Additional Follow-up by: Vicie Mutters,  January 29, 2010 10:04 AM

## 2011-01-20 NOTE — Assessment & Plan Note (Signed)
Summary: ROUTINE F-UP/FH    History of Present Illness Visit Type:  Follow-up Visit Primary GI MD:  Stan Head MD Clementeen Graham Primary MD:  Oliver Barre, MD Referral MD:  n/a Chief Complaint:  Non-Alcoholic Steatohepatitis History of Present Illness:  Weight down 7#. Is using weight watchers ice cream and candies. Cannot seem to avoid sweets. Is using weight watchers book on her own.            Current Medications (verified): 1)  Effexor Xr 150 Mg  Cp24 (Venlafaxine Hcl) .... Take 1 By Mouth Qd 2)  Boniva 150 Mg  Tabs (Ibandronate Sodium) .... Take 1 By Mouth Q Month 3)  Hydrochlorothiazide 25 Mg  Tabs (Hydrochlorothiazide) .Marland Kitchen.. 1 By Mouth Qd 4)  Metoprolol Succinate 50 Mg Tb24 (Metoprolol Succinate) .... Take 1 Tablet By Mouth Once A Day 5)  Bayer Aspirin Ec Low Dose 81 Mg  Tbec (Aspirin) .... Take 1 Tablet By Mouth Once A Day 6)  Fish Oil   Oil (Fish Oil) .... Take 1 Tablets By Mouth Once Daily. 7)  Vitamin B-12 1000 Mcg  Tabs (Cyanocobalamin) .... 2  Tablet By Mouth Once Daily 8)  Calcium Carbonate 600 Mg  Tabs (Calcium Carbonate) .Marland Kitchen.. 1 Tablet By Mouth Two Times A Day 9)  Levothyroxine Sodium 25 Mcg Tabs (Levothyroxine Sodium) .Marland Kitchen.. 1 Tablet By Mouth Once Daily 10)  Vitamin D 09811 Unit Caps (Ergocalciferol) .Marland Kitchen.. 1 Capsule Every Week 11)  Acai 500 Mg Caps (Acai) .... Take 2 By Mouth Once Daily 12)  Coq10 Maximum Strength 400 Mg Caps (Coenzyme Q10) .Marland Kitchen.. 1 By Mouth Every Day 13)  Crestor 20 Mg Tabs (Rosuvastatin Calcium) .Marland Kitchen.. 1 By Mouth Qd  Allergies (verified): 1)  ! 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      (07/22/2008)  Past Medical History:    Hyperlipidemia    Depression    Anxiety    OSA    symptomatic PAC's    Osteopenia    Hypertension    Back surgery    Hypothyroidism    low vit D    NASH (fatty liver)  Vital Signs:  Patient profile:   60 year old female Height:      63 inches Weight:      206.2 pounds BMI:     36.66 Pulse rate:   88 / minute Pulse rhythm:   regular BP sitting:   128 / 82  (right arm) Cuff size:   regular  Vitals Entered By: Harlow Mares CMA (March 02, 2009 2:14 PM)   Impression & Recommendations:  Problem # 1:  NASH (NON-ALCOHOLIC STEATOHEPATITIS) (ICD-571.8) Assessment Improved LFT's are better. ? if due to weight loss. She is losing and will keep trying. Plan LFT's in 3 mos and REV in 6 mos.  Problem # 2:  OBESITY, UNSPECIFIED (ICD-278.00) Assessment: Improved Losing weight .Discussed restricting calories and high-fructose corn syrup.  To increase exercise also.  15 minutes time spent > half counselling.  Patient Instructions: 1)  Keep losing weight with the Weight Watchers book. 2)  Avoid and minimize (eliminate is best, but hard) high-fructose corn syrup in your diet. 3)  Exercise as you plan to. 4)  Keep all of this up. 5)  Do not use alcohol. 6)  See me in 6 months, call before you come and we will check the liver tests again then. We will also check them in 3 months from now and call the results. 7)  Please schedule a follow-up appointment in 6 months. 8)  STOP ACAI (GRAPE SEED EXTRACT), STOP COQ10. There is no proven benefit of these to you. Ask your primary MD about the fish oil.  9)  The medication list was reviewed and reconciled.  All changed / newly prescribed medications were explained.  A complete medication list was provided to the patient / caregiver. 10)  The medication list was reviewed and reconciled.  All changed / newly prescribed  medications were explained.  A complete medication list was provided to the patient / caregiver.

## 2011-05-06 NOTE — Op Note (Signed)
[place]  Patient:    Elizabeth Buckley, Elizabeth Buckley                     MRN: [id number] Proc. Date: 06/01/01 Adm. Date:  [id number] Attending:  Ronne Binning                           Operative Report  PREOPERATIVE DIAGNOSIS:  Stenosing tenosynovitis, right thumb.  POSTOPERATIVE DIAGNOSIS:  Stenosing tenosynovitis, right thumb.  OPERATION:  Release of A-1 pulley, right thumb.  SURGEON:  Nicki Reaper, M.D.  ASSISTANT:  Joaquin Courts, R.N.  ANESTHESIA:  Forearm-based IV regional.  ANESTHESIOLOGIST:  Janetta Hora. Gelene Mink, M.D.  HISTORY:  The patient is a 60 year old female with history of triggering of her right thumb, not responsive to conservative treatment.  DESCRIPTION OF PROCEDURE:  The patient was brought to the operating room where a forearm-based IV regional anesthetic was carried out without difficulty. She was prepped and draped using Betadine scrubbing solution with the right arm free.  A transverse incision was made over the A-1 pulley of the right thumb and carried down through subcutaneous tissue, bleeders electrocauterized, neurovascular structures identified and protected.  To the radial side of the flexor tendon, the sheath of A-1 pulley was incised; the oblique pulley was left intact.  Thumb was placed through a full range of motion; no further triggering was identified.  The wound was irrigated.  Skin was closed with interrupted 5-0 nylon suture.  Sterile compressive dressing was applied.  The patient tolerated the procedure well and was taken to the recovery room for observation in stable condition.  She is discharged home to return to the [facility] in one week on Vicodin and Keflex. DD:  06/01/01 TD:  06/01/01 Job: [id number]

## 2011-05-06 NOTE — Assessment & Plan Note (Signed)
[facility]                               PULMONARY OFFICE NOTE   Elizabeth, Buckley                     MRN:          [id number]  DATE:09/15/2006                            DOB:          December 20, 1950    SUBJECTIVE:  This patient was finally able to make an appointment to come  into the office after numerous attempts to get a hold of her to discuss her  sleep study that was done on August 22, 2006.  The patient was found to  have a respiratory disturbance index of 7 events per hour and O2  desaturation as low as 85%.  She was also found to have very large numbers  of leg jerks with 196 movements and 11 per hour resulting in arousal or  awakening.  The patient was also noted to have very frequent PAC's  throughout the study.  I have gone over the study in great detail with her  and answered all of her questions.   PHYSICAL EXAMINATION:  VITAL SIGNS:  Blood pressure is 128/88, pulse 76,  temperature 97.4, weight 186 pounds.  O2 saturation on room air is 94%.  GENERAL:  She is an obese white female in no acute distress.   IMPRESSION:  1. Very mild obstructive sleep apnea documented by nocturnal      polysomnography.  I suspect this is not the prime reason for her sleep      disruption and significant daytime sleepiness.  2. Large numbers of leg jerks with no history from the patient consistent      with restless leg syndrome.  I suspect she has a periodic leg movement      syndrome and this represents the primary entity that is disrupting her      sleep.  I talked with her about trying Requip for treatment and she is      in agreement.  3. Very large numbers of premature atrial contractions noted on the sleep      study.  It is really unclear whether this is clinically significant in      terms of her electrical conduction system or whether this could also be      disrupting her sleep.  At this point in time I recommend a 48 hour      Holter  monitor and possible cardiac consultation.  I will leave this to      the judgment of Dr. Jonny Ruiz.   PLAN:  1. Work on weight loss.  2. Trial of Requip, 1 mg nightly and she is to follow-up in three to four      weeks.  3. Consider Holter monitor and possibly cardiac evaluation.            ______________________________  Barbaraann Share, MD,FCCP   [facility]  DD:  09/15/2006  DT:  09/18/2006  Job #:  [id number]   cc:   Corwin Levins, MD

## 2011-05-06 NOTE — H&P (Signed)
[facility]  Patient:    Elizabeth Buckley, Elizabeth Buckley                       MRN: [id number] Attending:  Katherine Roan, M.D.                         History and Physical  CHIEF COMPLAINT:  Persistent dysplasia.  HISTORY OF PRESENT ILLNESS:  Elizabeth Buckley is a 60 year old, gravida 1, para 1, female, with normal vaginal delivery in 1973, who has had abnormal Pap smears for the last four years.  Initial diagnosis in 1997 was moderate squamous cell dysplasia.  Subsequently, she has had irregular Pap smears with only mild dysplasia diagnosed.  She continues to have the dysplastic cells despite conization.  Endocervical curettage was negative.  Because of the persistent abnormal Pap smears, a hysterectomy is recommended at this time.  She is currently taking Premphase.  Her last menstrual period was two weeks ago.  Her last Pap smear showed atypical cells suggestive of dysplasia.  She in addition to having had cold knife conization has had cryotherapy as well.  PAST SURGICAL HISTORY:  Laparoscopic gallbladder in 1991, tubal ligation at age 50.  She had a cold knife conization in 1999.  ALLERGIES:  She is said to be allergic to CODEINE.  She is not allergic to Latex.  REVIEW OF SYSTEMS:  HEENT:  She wears reading glasses, but no decrease in visual or auditory acuity.  No frequent headaches or dizziness.  No frequent sore throats.  HEART:  No history of hypertension.  No rheumatic fever.  No history of heart murmur.  LUNGS:  No chronic cough.  No hemoptysis.  She denies shortness of breath.  GU:  No history of urinary tract infection.  She has a mixture of stress and urge, and it appears on symptom complex that most of this is urge, although occasional stress incontinence.  GI:  No history of bowel habit change, no melena, no anorexia, no indigestion, no history of reflux.  MUSCLES/BONES/JOINTS:   No history of fractures or arthritis.  SOCIAL HISTORY:  She works at [employer].  She drinks alcohol socially.  FAMILY HISTORY:  Her mother is 7 and living and well.  Her father died of cancer of the lung.  She has no brothers or sisters.  No heart disease or diabetes.  PHYSICAL EXAMINATION:  VITAL SIGNS:  Weight 172 pounds.  Blood pressure 130/80, pulse 80, respirations 16.  HEENT:  Ears, nose and throat are unremarkable.  The oropharynx is not injected.  NECK:  Supple.  Thyroid is not enlarged.  Carotid pulses are equal bilaterally without bruits.  No adenopathy.  Trachea is midline.  BREASTS:  No masses or tenderness.  LUNGS:  Clear to P&A.  HEART:  Normal sinus rhythm.  No heaves, thrills, rubs or gallops.  ABDOMEN:  Soft, flat.  Liver, spleen or kidneys are not palpated.  Bowel sounds are normal.  Femoral pulses are equal.  No tenderness.  No masses felt.  PELVIC:  No vulva and vagina.  The urethra appears to be fairly well supported without masses.  Cervix is normal size and shape, clean, post cone changes are noted.  Uterus is normal size and shape, no masses.  Adnexa negative.  RECTAL/VAGINAL:  Confirmed.  Hemoccult is negative.  EXTREMITIES:  Show good range of motion, equal pulses and reflexes.  IMPRESSION:  Persistent dysplasia of cervix.  PLAN:  Laparoscopic-assisted vaginal hysterectomy, bilateral salpingo-oophorectomy.  Elizabeth Buckley has been given detailed informed consent including risks of damage to bladder, bowel, vascular structures, infection and hemorrhage. DD:  07/25/00 TD:  07/25/00 Job: [id number]

## 2011-05-06 NOTE — Op Note (Signed)
NAMEJENILYN, MAGANA              ACCOUNT NO.:  [id number]   MEDICAL RECORD NO.:  [id number]          PATIENT TYPE:  AMB   LOCATION:  SDS                          FACILITY:  [facility]   PHYSICIAN:  Tia Alert, MD     DATE OF BIRTH:  03-10-1951   DATE OF PROCEDURE:  01/04/2006  DATE OF DISCHARGE:                                 OPERATIVE REPORT   PREOPERATIVE DIAGNOSIS:  Lumbar spinal stenosis with lateral recess stenosis  L3-4, L4-5 on the left with left leg pain.   POSTOP DIAGNOSIS:  Lumbar spinal stenosis with lateral recess stenosis L3-4,  L4-5 on the left with left leg pain.   PROCEDURES:  Decompressive hemilaminectomy, medial facetectomy and  foraminotomy at L3-4, L4-5 on the left side utilizing microscopic  dissection.   SURGEON:  Dr. Marikay Alar.   ASSISTANT:  Dr. Donalee Citrin.   ANESTHESIA:  General endotracheal.   COMPLICATIONS:  None apparent.   INDICATIONS FOR PROCEDURE:  Ms. Tally is a very pleasant 60 year old  white female who presented with left leg pain that seemed to follow both an  L4 and L5 distribution. She had no significant weakness, but her pain  continued to progressively worsen on medical management. She had an MRI and  CT myelogram which showed lateral recess stenosis at L3-4 and L4-5 on the  left side. I recommended lumbar decompressive hemilaminectomy at those two  levels for nerve root decompression. She understood the risks, benefits, and  expected outcome and wished to proceed.   DESCRIPTION OF PROCEDURE:  The patient was taken to operating room and after  induction of adequate generalized endotracheal anesthesia, she was rolled  into the prone position on the Wilson frame. All pressure points were  padded. Her lumbar region was prepped with DuraPrep and draped in the usual  sterile fashion. 8 cc local anesthesia was injected and then a dorsal  midline incision was made and carried down to the lumbosacral fascia. The  fascia was opened and  the paraspinous musculature was taken down in a  subperiosteal fashion to expose L3-4, L4-5 on the left side. Intraoperative  x-ray confirmed my level and then I used the Kerrison punch to perform a  hemilaminectomy, medial facetectomy and foraminotomy at L3-4 on the left  side. The yellow ligament was identified, opened and removed in a piecemeal  fashion to expose the underlying dura and L4 nerve root.  The L4 nerve root  was retracted medially and the epidural venous vasculature was coagulated  and then under microscopic dissection, the disk space was visualized. We  found to have a small subannular bulge but no significant herniation and the  nerve root looked free.  I continued to decompress the lateral recess, then  palpated with a coronary dilator to assure adequate decompression of the  lateral recess and the L4 nerve root and then turned my attention the L4-5  level and again performed a hemilaminectomy, medial facetectomy,  foraminotomy at L5 on the left side. The yellow ligament was opened and  removed in a piecemeal fashion to expose the underlying dura and L5 nerve  root. The nerve root was retracted medially once again.  The epidural venous  vasculature was coagulated and the disk space was visualized once again.  Again we found a small subannular bulge but no significant herniation and  the nerve root seen to be very free of compression. We then irrigated with  copious amounts of bacitracin containing saline solution, dried all bleeding  points with bipolar cautery and then lined the dura with Gelfoam and closed  the fascia with interrupted #1 Vicryl, closed the subcutaneous and  subcuticular tissues with 2-0 and 3-0 Vicryl and closed the skin with  Benzoin Steri-Strips. The drapes removed. Sterile dressing was applied. The  patient was awakened from anesthesia and transferred to recovery room in  stable condition. At the end of procedure all sponge, needle and instrument   counts were correct.      Tia Alert, MD  Electronically Signed     DSJ/MEDQ  D:  01/04/2006  T:  01/04/2006  Job:  [id number]

## 2011-05-06 NOTE — Op Note (Signed)
[facility]  Patient:    Elizabeth Buckley, Elizabeth Buckley                     MRN: [id number] Proc. Date: 07/25/00 Adm. Date:  [id number] Attending:  Lendon Colonel                           Operative Report  PREOPERATIVE DIAGNOSIS:  Persistent dysplasia of cervix.  POSTOPERATIVE DIAGNOSIS:  Persistent dysplasia of cervix.  OPERATION PERFORMED:  Laparoscopically assisted vaginal hysterectomy, bilateral salpingo-oophorectomy.  DESCRIPTION OF PROCEDURE:  The patient was placed in the dorsal lithotomy position for operative laparoscopy using the Allen stirrups. The patient was prepped and draped in the usual fashion for laparoscopic surgery. The catheter was inserted into the bladder. A transverse umbilical incision was made in the umbilicus and Veress needle was inserted. Aspiration infusion technique was utilized to infuse 3 liters of CO2 into the abdominal cavity under low pressure. The trocar was inserted into the abdomen.  Visualization of the pelvis revealed a slightly enlarged uterus. Both ovaries were atrophic and normal size and mobile. A second puncture was made in the midline with a 5 mm disposable trocar and a third puncture was made in the right mid quadrant with a 10 mm disposable trocar. The Seitzinger tripolar forceps made by Everet was then brought into view 5 mm instrument and the infundibulopelvic ligaments on both sides were cauterized and cut as was the round ligaments and the broad ligament. The bladder flap was created with the scissors. The bladder was pushed off the lower segment. The patient was then placed in the exaggerated lithotomy position for vaginal surgery. The cervix was circumscribed. A wide cuff was obtained because of the dysplasia. The uterus was very well supported. The uterosacral ligaments were then divided after the cul-de-sac posteriorly was entered and anteriorly. The uterosacral ligaments divided, the cardinal  ligaments were divided and ligated with #0 chromic suture. The uterine vessels were divided with #0 chromic suture. The specimen was then removed from the operative field consisting of the uterus, tubes and ovaries. The uterosacral ligaments were then plicated in the midline with one #0 Ethibond to sure up the vaginal support and the peritoneum was closed with 2-0 PDS. The vagina was closed in a vertical fashion with 2-0 PDS. Hemostasis was secured. There was no unusual blood loss. Then we went above and filled the abdomen with CO2 and placed her in the deep Trendelenburg position. All pedicles were hemostatically secure as were the trocar incisions. The trocars were removed, incisions closed with deep sutures of #0 Vicryl on a UR6 needle and then the skin was closed with 3-0 Vicryl. Hemostasis was secure. The 3 incisions were infiltrated with 0.5% Marcaine with epinephrine. The patient tolerated the procedure well and was sent to the recovery room in good condition. DD:  07/25/00 TD:  07/25/00 Job: [id number]

## 2011-05-06 NOTE — Procedures (Signed)
NAMEKATHRYNNE, Elizabeth Buckley NO.:  [id number]   MEDICAL RECORD NO.:  [id number]          PATIENT TYPE:  OUT   LOCATION:  SLEEP CENTER                 FACILITY:  [facility]   PHYSICIAN:  Barbaraann Share, MD,FCCPDATE OF BIRTH:  [birth year]   DATE OF STUDY:  08/22/2006                              NOCTURNAL POLYSOMNOGRAM   REFERRING PHYSICIAN:  Dr. Marcelyn Bruins   INDICATIONS FOR STUDY:  Hypersomnia with sleep apnea.   EPWORTH SCORE:  16.   SLEEP ARCHITECTURE:  The patient had total sleep time of 387 minutes with  adequate slow wave sleep for age, however never achieved REM.  Sleep onset  latency was at the upper limits of normal.  Sleep efficiency was decreased  at 87%.   RESPIRATORY DATA:  The patient was found to have 26 hypopneas and 18 apneas  for a respiratory disturbance index of seven events per hour.  The events  were not positional and there was mild intermittent snoring noted  throughout.   OXYGEN DATA:  There was O2 desaturation as low as 85% with the patient's  obstructive events.   CARDIAC DATA:  VERY frequent PACs were noted throughout.   MOVEMENT/PARASOMNIA:  The patient was found to have 196 leg jerks with 11  per hour resulting in arousal or awakening.  There were no abnormal  behaviors noted.   IMPRESSION/RECOMMENDATIONS:  1. Very mild obstructive sleep apnea/hypopnea syndrome with a respiratory      disturbance index of seven events per hour and O2 desaturation as low      as 85%.  2. Very frequent PACs were noted throughout the study.  Clinical      correlation is suggested.  The patient may benefit from an overnight      Holter monitor.  3. Large numbers of leg jerks with what appears to be significant sleep      disruption.  This may be more of a factor with respect to the patient's      daytime symptoms than her sleep disordered breathing.  Again, clinical      correlation is suggested.         ______________________________  Barbaraann Share,  MD,FCCP  Diplomate, American Board of Sleep  Medicine    [facility]/MED[facility]  D:  08/24/2006 14:08:22  T:  08/24/2006 16:48:08  Job:  [id number]

## 2011-05-06 NOTE — Assessment & Plan Note (Signed)
[facility]                               PULMONARY OFFICE NOTE   BENNETTA, RUDDEN                     MRN:          [id number]  DATE:07/10/2006                            DOB:          1951-07-04    SLEEP MEDICINE CONSULTATION:   HISTORY OF PRESENT ILLNESS:  The patient is a 60 year old white female whom  I have been asked to see for possible sleep apnea.  The patient states that  she has been told that she has a lot of snoring and pauses in her breathing  during sleep by her husband.  She typically goes to bed between 9:30 and 10  p.m. and gets up at 4 a.m. to start her day whenever she has to work.  She  is tired in the mornings whenever she arises and notes significant  sleepiness with periods of inactivity.  She feels that sleeping longer  really never helps and has noted some sleepiness with driving.  Of note, the  patient's weight has increased by 40 pounds over the last 6 years.   PAST MEDICAL HISTORY:  1.  Dyslipidemia.  2.  History of depression.  3.  Status post cholecystectomy, hysterectomy and tubal ligation.  4.  Status post appendectomy.   CURRENT MEDICATIONS:  1.  Premarin 0.625 mg daily.  2.  Crestor 20 mg daily.  3.  Effexor XR 150 mg two daily.  4.  Aspirin 81 mg daily.   The patient is intolerant to codeine because of GI distress.   SOCIAL HISTORY:  She works for [employer] in [place].  She is  married.  She has a history of smoking 2 packs a day for 30 years.  She has  not smoked in 6 years.   FAMILY HISTORY:  Remarkable for her father having emphysema as well as  throat cancer.   REVIEW OF SYSTEMS:  As in history of present illness.  Also see nursing  notation in the chart.   PHYSICAL EXAMINATION:  GENERAL:  She is an obese white female in no acute  distress.  VITAL SIGNS:  Blood pressure is 134/82, pulse 58, temperature is 98.4,  weight is 198 pounds.  O2 saturation on room air is 94%.  HEENT:   Pupils equal, round and reactive to light and accommodation.  Extraocular muscles are intact.  Nares show mild septal deviation to the  left.  Oropharynx shows elongation of the soft palate and uvula.  NECK:  Supple without JVD or lymphadenopathy.  There is no palpable  thyromegaly.  CHEST:  Totally clear.  CARDIAC:  Regular rate and rhythm, without murmurs, rubs or gallops.  ABDOMEN:  Soft, nontender, with good bowel sounds.  GENITAL, RECTAL, BREASTS:  Not done and not indicated.  EXTREMITIES:  Lower extremities are without edema.  Good pulses distally  with no calf tenderness.  NEUROLOGIC:  Alert and oriented with no obvious motor deficits.   IMPRESSION:  Probable obstructive sleep apnea.  The patient gives a very  good history for this process and is overweight and has an abnormal upper  airway anatomy.  At this point in time I think she would benefit from a  nocturnal polysomnography as well as weight loss.   PLAN:  1.  Work on weight loss.  2.  Schedule for NPSG.  3.  The patient will follow up after her study.                                   Barbaraann Share, MD, FCCP   [facility]/Med[facility]  DD:  07/10/2006  DT:  07/10/2006  Job #:  [id number]   cc:   Corwin Levins, MD

## 2011-05-06 NOTE — Op Note (Signed)
[facility]  Patient:    Elizabeth Buckley, Elizabeth Buckley                     MRN: [id number] Proc. Date: 04/04/01 Adm. Date:  [id number] Disc. Date: [id number] Attending:  Meredith Leeds                           Operative Report  PREOPERATIVE DIAGNOSIS:  Acute appendicitis.  POSTOPERATIVE DIAGNOSIS:  Acute appendicitis.  OPERATION:  Laparoscopic appendectomy.  SURGEON:  Zigmund Daniel, M.D.  ANESTHESIA:  General.  DESCRIPTION OF PROCEDURE:  After the patient was monitored and anesthetized and after routine preparation and draping of the abdomen, I reopened the small infraumbilical incision which had been previously made for a laparoscopic cholecystectomy.  I then entered the peritoneum bluntly after opening the fascia longitudinally and found that the area was free of adhesions.  I put in a 0 Vicryl pursestring suture in the fascia and secured a Hasson cannula and inflated the abdomen with CO2.  After adequate pneumoperitoneum, I observed the abdominal contents, and all the viscera appeared normal except that there was inflammation near the tip of the cecum.  I put in two additional ports and bluntly dissected the area and located and the appendix.  It was acutely inflamed.  No other abnormalities were found, and the appendix did not appear to be gangrenous or perforated.  I then dissected the mesoappendix and clipped the appendiceal artery to control it, and I divided it.  The base of the appendix was healthy in appearance, and so I stapled across it with the endoscopic stapler with the stapler cutting it, and it transected nicely near the cecum.  After cauterizing one small bleeder, hemostasis was good.  There was no significant accumulation of blood.  I removed the appendix through the umbilical incision inside of the plastic port and then tied the pursestring suture.  I removed the right upper quadrant port under direct vision and then after  releasing the pneumoperitoneum, I removed the suprapubic port.  I anesthetized all the incisions with 0.50% Marcaine with epinephrine and closed the skin with intercuticular 4-0 Vicryl and Steri-Strips.  Sponge, needle and instrument counts were correct.  The patient tolerated the operation well. DD:  04/04/01 TD:  04/05/01 Job: [id number]

## 2011-05-06 NOTE — Discharge Summary (Signed)
[facility]  Patient:    DONICE, ALPERIN                     MRN: [id number] Adm. Date:  [id number] Disc. Date: 07/26/00 Attending:  Lendon Colonel                           Discharge Summary  ADMISSION DIAGNOSIS:  Persistent dysplasia of cervix, status post conservative therapy.  HISTORY OF PRESENT ILLNESS:  Ms. Lastra is a 60 year old para 1 female who presents for an LAVH-BSO for persistent dysplasia.  General physical examination, other than anxiety, was unremarkable.  She had a mixture of stress and urinary incontinence.  LABORATORY DATA:  Comprehensive metabolic profile, hemoglobin, and coagulation profile were within normal limits.  Preoperative cardiogram was said to be within normal limits.  HOSPITAL COURSE:  The patient was admitted to the hospital and underwent an uneventful laparoscopically assisted hysterectomy with removal of both ovaries.  Her postoperative course was uncomplicated.  She was discharged the following morning to home and office care.  She is to continue her hormone replacement therapy.  She is to take Percocet for pain and Restoril for sleep. She will call for fever or bleeding or any other problems she may have.  CONDITION ON DISCHARGE:  Improved. DD:  07/26/00 TD:  07/26/00 Job: [id number]

## 2011-07-29 ENCOUNTER — Encounter: Payer: Self-pay | Admitting: Internal Medicine

## 2011-07-29 ENCOUNTER — Ambulatory Visit (INDEPENDENT_AMBULATORY_CARE_PROVIDER_SITE_OTHER): Payer: Medicare Other | Admitting: Internal Medicine

## 2011-07-29 VITALS — BP 130/98 | HR 80 | Temp 98.2°F | Ht 63.0 in | Wt 215.0 lb

## 2011-07-29 DIAGNOSIS — K921 Melena: Secondary | ICD-10-CM | POA: Insufficient documentation

## 2011-07-29 DIAGNOSIS — R42 Dizziness and giddiness: Secondary | ICD-10-CM | POA: Insufficient documentation

## 2011-07-29 DIAGNOSIS — K5909 Other constipation: Secondary | ICD-10-CM

## 2011-07-29 DIAGNOSIS — I10 Essential (primary) hypertension: Secondary | ICD-10-CM

## 2011-07-29 NOTE — Assessment & Plan Note (Signed)
stable overall by hx and exam, most recent data reviewed with pt, and pt to continue medical treatment as before  BP Readings from Last 3 Encounters:  07/29/11 130/98  09/21/10 130/82  09/22/09 128/88

## 2011-07-29 NOTE — Progress Notes (Signed)
Subjective:    Patient ID: Elizabeth Buckley, female    DOB: January 11, 1951, 60 y.o.   MRN: [id number]  HPI  Here for evaluation as friend asked her to come by for an ECG, after an episode recently of mild acute on chronic constipation a few days ago, with an episode coming out of the shower with onset abd discomfort, sweats, nausea, belching, lightheaded and dizzy such that she had to wrap a towel and sit on the BR floor to avoid syncope;  Husband attended her, no LOC and symptoms seemed improved except for vomit x 2 1 hour later, then symtpoms resolved.  Had another HA later improved with advil.  Pt denies chest pain, increased sob or doe, wheezing, orthopnea, PND, increased LE swelling, palpitations, dizziness or syncope.  Pt denies new neurological symptoms such as new headache, or facial or extremity weakness or numbness   Pt denies polydipsia, polyuria.   Pt denies fever, wt loss, night sweats, loss of appetite, or other constitutional symptoms. Does also mention an episode of small volume BRB with BM last wk as well; has hx of colon polpys and is overdue for colonoscopy.   Past Medical History  Diagnosis Date  . ABNORMAL TRANSAMINASE-LFT'S 05/21/2008  . ANEMIA-NOS 09/21/2010  . ANXIETY 10/04/2007  . BACK PAIN 04/15/2008  . COLONIC POLYPS, HX OF 09/21/2010  . COLONIC POLYPS, HYPERPLASTIC 01/03/2003  . CONSTIPATION, CHRONIC 05/21/2008  . DEPRESSION 10/04/2007  . EXTERNAL HEMORRHOIDS 01/03/2003  . FATIGUE 09/21/2010  . HYPERLIPIDEMIA 07/04/2007  . HYPERTENSION 10/04/2007  . HYPOTHYROIDISM 10/29/2008  . LOW BACK PAIN 09/22/2009  . NASH (NON-ALCOHOLIC STEATOHEPATITIS) 04/16/2008  . Obesity, unspecified 05/21/2008  . OBSTRUCTIVE SLEEP APNEA 10/04/2007  . OSTEOPENIA 10/04/2007  . PREMATURE BEATS, ATRIAL 10/04/2007  . RASH-NONVESICULAR 09/21/2010  . RECTAL BLEEDING 10/14/2008  . SEBACEOUS CYST, INFECTED 09/21/2010   Past Surgical History  Procedure Date  . Abdominal hysterectomy   . Edg 01/03/2003  .  Electrocardiogram 07/27/2006  . Appendectomy   . Cholecystectomy   . Tubal ligation   . Trigger finger repair left thumb   . Cataract surgery     left and right eye  . S/p lumbar surgery   . S/p liver biopsy     Dr. Leone Payor    reports that she has quit smoking. She does not have any smokeless tobacco history on file. She reports that she drinks alcohol. She reports that she does not use illicit drugs. family history includes Alcohol abuse in her other; Heart disease in her father; Hyperlipidemia in her other; and Hypertension in her other. Allergies  Allergen Reactions  . Codeine    No current outpatient prescriptions on file prior to visit.   Review of Systems Review of Systems  Constitutional: Negative for diaphoresis and unexpected weight change.  HENT: Negative for drooling and tinnitus.   Eyes: Negative for photophobia and visual disturbance.  Respiratory: Negative for choking and stridor.   Gastrointestinal: Negative for vomiting and blood in stool.    Objective:   Physical Exam BP 130/98  Pulse 80  Temp(Src) 98.2 F (36.8 C) (Oral)  Ht 5\' 3"  (1.6 m)  Wt 215 lb (97.523 kg)  BMI 38.09 kg/m2  SpO2 92% Physical Exam  VS noted, NAD, mod obese Constitutional: Pt appears well-developed and well-nourished.  HENT: Head: Normocephalic.  Right Ear: External ear normal.  Left Ear: External ear normal.  Eyes: Conjunctivae and EOM are normal. Pupils are equal, round, and reactive to light.  Neck: Normal range  of motion. Neck supple.  Cardiovascular: Normal rate and regular rhythm.   Pulmonary/Chest: Effort normal and breath sounds normal.  Abd:  Soft, NT, non-distended, + BS Neurological: Pt is alert. No cranial nerve deficit.  Skin: Skin is warm. No erythema.  Psychiatric: Pt behavior is normal. Thought content normal. 1+ nervous        Assessment & Plan:

## 2011-07-29 NOTE — Assessment & Plan Note (Signed)
ecg reviewed - sinus, no acute change;  Hx and exam c/w vagal episode most likely related to constipatoin, no further eval felt needed at this time

## 2011-07-29 NOTE — Assessment & Plan Note (Signed)
Pt states overdue for f/u colonoscopy - will refer for f/u

## 2011-07-29 NOTE — Patient Instructions (Addendum)
Your EKG was OK today Please take Miralax daily for the constipation You will be contacted regarding the referral for: colonoscopy Please consider Dr Nicholas Lose for GYN

## 2011-07-29 NOTE — Assessment & Plan Note (Signed)
For daily miralax asd,  to f/u any worsening symptoms or concerns

## 2011-09-07 ENCOUNTER — Other Ambulatory Visit: Payer: Self-pay | Admitting: Obstetrics and Gynecology

## 2011-09-07 DIAGNOSIS — Z1231 Encounter for screening mammogram for malignant neoplasm of breast: Secondary | ICD-10-CM

## 2011-09-12 ENCOUNTER — Ambulatory Visit (INDEPENDENT_AMBULATORY_CARE_PROVIDER_SITE_OTHER): Payer: Medicare Other | Admitting: Internal Medicine

## 2011-09-12 ENCOUNTER — Encounter: Payer: Self-pay | Admitting: Internal Medicine

## 2011-09-12 ENCOUNTER — Other Ambulatory Visit (INDEPENDENT_AMBULATORY_CARE_PROVIDER_SITE_OTHER): Payer: Medicare Other

## 2011-09-12 DIAGNOSIS — D649 Anemia, unspecified: Secondary | ICD-10-CM

## 2011-09-12 DIAGNOSIS — K648 Other hemorrhoids: Secondary | ICD-10-CM

## 2011-09-12 DIAGNOSIS — K5909 Other constipation: Secondary | ICD-10-CM

## 2011-09-12 DIAGNOSIS — R7401 Elevation of levels of liver transaminase levels: Secondary | ICD-10-CM

## 2011-09-12 DIAGNOSIS — E669 Obesity, unspecified: Secondary | ICD-10-CM

## 2011-09-12 NOTE — Assessment & Plan Note (Addendum)
Will recheck CBC - if anemic again may require further evaluation - iron studies, etc

## 2011-09-12 NOTE — Assessment & Plan Note (Signed)
Much better on MiraLax - to continue

## 2011-09-13 ENCOUNTER — Encounter: Payer: Self-pay | Admitting: Internal Medicine

## 2011-09-13 LAB — CBC WITH DIFFERENTIAL/PLATELET
Basophils Relative: 0.9 % (ref 0.0–3.0)
Eosinophils Relative: 5 % (ref 0.0–5.0)
HCT: 39.5 % (ref 36.0–46.0)
Hemoglobin: 12.9 g/dL (ref 12.0–15.0)
Lymphocytes Relative: 44 % (ref 12.0–46.0)
Lymphs Abs: 2.7 10*3/uL (ref 0.7–4.0)
Monocytes Relative: 6.1 % (ref 3.0–12.0)
Neutro Abs: 2.7 10*3/uL (ref 1.4–7.7)
RBC: 4.18 Mil/uL (ref 3.87–5.11)

## 2011-09-13 NOTE — Assessment & Plan Note (Signed)
Seen on anoscopy today and on 2004 colonoscopy. She has a history of bleeding when constipated. To continue MiraLax. Given the findings today would not pursue colonoscopy.Wait til 2014 routine colonoscopy.

## 2011-09-13 NOTE — Patient Instructions (Signed)
Please go to the basement upon leaving today to have your labs done. 

## 2011-09-13 NOTE — Assessment & Plan Note (Addendum)
BMI 38 with htn, sleep apnea, fatty liver I encouraged her to contact the bariatric surgery program. Best long-term wight loss and she should improve in multiple areas if she were to do this.

## 2011-09-13 NOTE — Progress Notes (Signed)
  Subjective:    Patient ID: Elizabeth Buckley, female    DOB: 1951-03-29, 60 y.o.   MRN: [id number]  HPI 60 yo ww with fatty liver and history of hemorrhoids and constipation. She had exacerbation of chronic constipation with straining to stool recently. Has seen some blood (bright red) on the toilet paper. Similar to what she has seen in the past. Using MiraLax has helped control constipation and no more bleeding. No persistently decreased stool caliber.No blood except on the toilet paper.   Review of Systems As above    Objective:   Physical Exam General: obese Eyes: anicteric Lungs: clear Heart: S1S2 no rubs, murmurs or gallops Abdomen: soft and nontender, BS+ Ext: no edema  ANOSCOPY: rectal shows normal anoderm, no mass and brown stool. The anoscope reveals small hemorrhoids in anal canal.          Assessment & Plan:

## 2011-09-14 ENCOUNTER — Other Ambulatory Visit: Payer: Self-pay | Admitting: Internal Medicine

## 2011-09-19 ENCOUNTER — Ambulatory Visit
Admission: RE | Admit: 2011-09-19 | Discharge: 2011-09-19 | Disposition: A | Discharge status: Home / Self Care | Payer: Medicare Other | Source: Ambulatory Visit | Attending: Obstetrics and Gynecology | Admitting: Obstetrics and Gynecology

## 2011-09-19 DIAGNOSIS — Z1231 Encounter for screening mammogram for malignant neoplasm of breast: Secondary | ICD-10-CM

## 2011-09-20 LAB — CBC
HCT: 39.5
Hemoglobin: 13.2
MCHC: 33.4
MCV: 81.7
RDW: 17.1 — ABNORMAL HIGH

## 2011-11-03 ENCOUNTER — Other Ambulatory Visit: Payer: Self-pay | Admitting: Internal Medicine

## 2011-11-03 ENCOUNTER — Other Ambulatory Visit (INDEPENDENT_AMBULATORY_CARE_PROVIDER_SITE_OTHER): Payer: Medicare Other

## 2011-11-03 ENCOUNTER — Encounter: Payer: Self-pay | Admitting: Internal Medicine

## 2011-11-03 ENCOUNTER — Ambulatory Visit (INDEPENDENT_AMBULATORY_CARE_PROVIDER_SITE_OTHER): Payer: Medicare Other | Admitting: Internal Medicine

## 2011-11-03 VITALS — BP 110/76 | HR 73 | Temp 97.7°F | Ht 63.0 in | Wt 217.0 lb

## 2011-11-03 DIAGNOSIS — I10 Essential (primary) hypertension: Secondary | ICD-10-CM

## 2011-11-03 DIAGNOSIS — L259 Unspecified contact dermatitis, unspecified cause: Secondary | ICD-10-CM

## 2011-11-03 DIAGNOSIS — M545 Low back pain, unspecified: Secondary | ICD-10-CM

## 2011-11-03 DIAGNOSIS — Z23 Encounter for immunization: Secondary | ICD-10-CM

## 2011-11-03 DIAGNOSIS — R5381 Other malaise: Secondary | ICD-10-CM

## 2011-11-03 DIAGNOSIS — R5383 Other fatigue: Secondary | ICD-10-CM

## 2011-11-03 DIAGNOSIS — L309 Dermatitis, unspecified: Secondary | ICD-10-CM

## 2011-11-03 DIAGNOSIS — E785 Hyperlipidemia, unspecified: Secondary | ICD-10-CM

## 2011-11-03 LAB — CBC WITH DIFFERENTIAL/PLATELET
Basophils Absolute: 0 10*3/uL (ref 0.0–0.1)
Lymphocytes Relative: 46 % (ref 12.0–46.0)
Monocytes Relative: 6 % (ref 3.0–12.0)
Platelets: 275 10*3/uL (ref 150.0–400.0)
RDW: 14.6 % (ref 11.5–14.6)

## 2011-11-03 LAB — URINALYSIS, ROUTINE W REFLEX MICROSCOPIC
Leukocytes, UA: NEGATIVE
Nitrite: NEGATIVE
Specific Gravity, Urine: 1.015 (ref 1.000–1.030)
Urobilinogen, UA: 0.2 (ref 0.0–1.0)
pH: 6 (ref 5.0–8.0)

## 2011-11-03 MED ORDER — ROSUVASTATIN CALCIUM 20 MG PO TABS: 20.0000 mg | ORAL_TABLET | Freq: Every day | ORAL | Status: DC

## 2011-11-03 MED ORDER — VENLAFAXINE HCL ER 150 MG PO CP24: 150.0000 mg | ORAL_CAPSULE | Freq: Every day | ORAL | Status: DC

## 2011-11-03 MED ORDER — TRIAMCINOLONE ACETONIDE 0.1 % EX CREA: TOPICAL_CREAM | Freq: Two times a day (BID) | CUTANEOUS | Status: DC

## 2011-11-03 MED ORDER — METOPROLOL SUCCINATE ER 50 MG PO TB24: 50.0000 mg | ORAL_TABLET | Freq: Every day | ORAL | Status: DC

## 2011-11-03 MED ORDER — HYDROCHLOROTHIAZIDE 25 MG PO TABS: 25.0000 mg | ORAL_TABLET | Freq: Every day | ORAL | Status: DC

## 2011-11-03 MED ORDER — ALPRAZOLAM 0.25 MG PO TABS: 0.2500 mg | ORAL_TABLET | Freq: Two times a day (BID) | ORAL | Status: DC | PRN

## 2011-11-03 NOTE — Patient Instructions (Signed)
Take all new medications as prescribed - the cream Continue all other medications as before ; all of your refills were sent to the pharmacy (excpet for the xanax given hardcopy) Please go to LAB in the Basement for the blood and/or urine tests to be done today Please call the phone number [phone] (the [employer] System) for results of testing in 2-3 days;  When calling, simply dial the number, and when prompted enter the MRN number above (the Medical Record Number) and the # key, then the message should start. You are otherwise up to date with prevention Please return in 1 year for your yearly visit, or sooner if needed

## 2011-11-03 NOTE — Assessment & Plan Note (Signed)
Etiology unclear, Exam otherwise benign, to check labs as documented, follow with expectant management  

## 2011-11-03 NOTE — Assessment & Plan Note (Signed)
Left post arm/temple - for triam cr prn,  to f/u any worsening symptoms or concerns., consider derm eval

## 2011-11-04 LAB — BASIC METABOLIC PANEL
CO2: 26 mEq/L (ref 19–32)
Calcium: 9.2 mg/dL (ref 8.4–10.5)
Glucose, Bld: 84 mg/dL (ref 70–99)
Potassium: 3.7 mEq/L (ref 3.5–5.1)
Sodium: 138 mEq/L (ref 135–145)

## 2011-11-04 LAB — HEPATIC FUNCTION PANEL
ALT: 24 U/L (ref 0–35)
AST: 31 U/L (ref 0–37)
Albumin: 4.1 g/dL (ref 3.5–5.2)
Alkaline Phosphatase: 82 U/L (ref 39–117)
Total Protein: 8.1 g/dL (ref 6.0–8.3)

## 2011-11-04 LAB — LIPID PANEL
HDL: 68 mg/dL (ref >39.00)
Total CHOL/HDL Ratio: 3

## 2011-11-04 LAB — LDL CHOLESTEROL, DIRECT: Direct LDL: 134.2 mg/dL

## 2011-11-06 ENCOUNTER — Encounter: Payer: Self-pay | Admitting: Internal Medicine

## 2011-11-06 NOTE — Assessment & Plan Note (Signed)
stable overall by hx and exam, most recent data reviewed with pt, and pt to continue medical treatment as before  Lab Results  Component Value Date   LDLCALC 85 09/22/2009

## 2011-11-06 NOTE — Assessment & Plan Note (Signed)
Acute worsening, for muscle relaxer, has percocet per Dr Dayton Scrape,  to f/u any worsening symptoms or concerns, cont PT as well

## 2011-11-06 NOTE — Assessment & Plan Note (Signed)
stable overall by hx and exam, most recent data reviewed with pt, and pt to continue medical treatment as before  BP Readings from Last 3 Encounters:  11/03/11 110/76  09/12/11 134/80  07/29/11 130/98

## 2011-11-06 NOTE — Progress Notes (Signed)
Subjective:    Patient ID: Elizabeth Buckley, female    DOB: 1951-09-11, 60 y.o.   MRN: [id number]  HPI Here to f/u; overall doing ok,  Pt denies chest pain, increased sob or doe, wheezing, orthopnea, PND, increased LE swelling, palpitations, dizziness or syncope.  Pt denies new neurological symptoms such as new headache, or facial or extremity weakness or numbness   Pt denies polydipsia, polyuria, or low sugar symptoms such as weakness or confusion improved with po intake.  Pt states overall good compliance with meds, trying to follow lower cholesterol, diabetic diet, wt overall stable but little exercise however.  Pt continues to have recurring LBP without change in severity, bowel or bladder change, fever, wt loss,  worsening LE pain/numbness/weakness, gait change or falls, except for incidental spasm to right lower back for 2-3 days.  Has percocet for ongoing chronic back pain per ortho.Trying to follow lower chol diet, has not had lipids checked recently.  Pt denies fever, wt loss, night sweats, loss of appetite, or other constitutional symptoms  Does have sense of ongoing fatigue, but denies signficant hypersomnolence. Past Medical History  Diagnosis Date  . ANEMIA-NOS 09/21/2010  . ANXIETY 10/04/2007  . BACK PAIN 04/15/2008  . CONSTIPATION, CHRONIC 05/21/2008  . DEPRESSION 10/04/2007  . EXTERNAL HEMORRHOIDS 01/03/2003  . HYPERLIPIDEMIA 07/04/2007  . HYPERTENSION 10/04/2007  . HYPOTHYROIDISM 10/29/2008  . LOW BACK PAIN 09/22/2009  . NASH (NON-ALCOHOLIC STEATOHEPATITIS) 04/16/2008  . Obesity, unspecified 05/21/2008  . OBSTRUCTIVE SLEEP APNEA 10/04/2007  . OSTEOPENIA 10/04/2007  . PREMATURE BEATS, ATRIAL 10/04/2007  . RASH-NONVESICULAR 09/21/2010  . SEBACEOUS CYST, INFECTED 09/21/2010   Past Surgical History  Procedure Date  . Abdominal hysterectomy   . Electrocardiogram 07/27/2006  . Appendectomy   . Cholecystectomy   . Tubal ligation   . Trigger finger repair left thumb   . Cataract surgery       left and right eye  . S/p lumbar surgery   . S/p liver biopsy     Dr. Leone Payor  . Upper gastrointestinal endoscopy 01/03/2003    colon polyps, external hemorrhoids  . Colonoscopy w/ biopsies and polypectomy 01/03/2003    submucosal lesion    reports that she has quit smoking. She has never used smokeless tobacco. She reports that she drinks alcohol. She reports that she does not use illicit drugs. family history includes Alcohol abuse in her other; Heart disease in her father; Hyperlipidemia in her other; and Hypertension in her other. Allergies  Allergen Reactions  . Codeine    Current Outpatient Prescriptions on File Prior to Visit  Medication Sig Dispense Refill  . aspirin 81 MG tablet Take 81 mg by mouth daily.        . Calcium Carbonate (CALCIUM 600 PO) Take by mouth 2 (two) times daily.        . Cholecalciferol (VITAMIN D3) 1000 UNITS CAPS Take by mouth daily.        . Omega-3 Fatty Acids (FISH OIL PO) Take by mouth daily.        . polyethylene glycol (MIRALAX / GLYCOLAX) packet Take 17 g by mouth as needed.         Review of Systems Review of Systems  Constitutional: Negative for diaphoresis and unexpected weight change.  HENT: Negative for drooling and tinnitus.   Eyes: Negative for photophobia and visual disturbance.  Respiratory: Negative for choking and stridor.   Gastrointestinal: Negative for vomiting and blood in stool.  Genitourinary: Negative for hematuria and decreased  urine volume.  Musculoskeletal: Negative for gait problem.  Skin: Negative for color change and wound.  Neurological: Negative for tremors and numbness.  Psychiatric/Behavioral: Negative for decreased concentration. The patient is not hyperactive.       Objective:   Physical Exam BP 110/76  Pulse 73  Temp(Src) 97.7 F (36.5 C) (Oral)  Ht 5\' 3"  (1.6 m)  Wt 217 lb (98.431 kg)  BMI 38.44 kg/m2  SpO2 94% Physical Exam  VS noted Constitutional: Pt appears well-developed and well-nourished.   HENT: Head: Normocephalic.  Right Ear: External ear normal.  Left Ear: External ear normal.  Eyes: Conjunctivae and EOM are normal. Pupils are equal, round, and reactive to light.  Neck: Normal range of motion. Neck supple.  Cardiovascular: Normal rate and regular rhythm.   Pulmonary/Chest: Effort normal and breath sounds normal.  Abd:  Soft, NT, non-distended, + BS Neurological: Pt is alert. No cranial nerve deficit.  Skin: Skin is warm. No erythema. has eczema type rash to left temple and arm Psychiatric: Pt behavior is normal. Thought content normal.     Assessment & Plan:

## 2011-11-09 ENCOUNTER — Other Ambulatory Visit: Payer: Self-pay | Admitting: Internal Medicine

## 2011-12-23 ENCOUNTER — Other Ambulatory Visit: Payer: Self-pay | Admitting: Obstetrics and Gynecology

## 2011-12-30 ENCOUNTER — Encounter (HOSPITAL_COMMUNITY): Payer: Self-pay | Admitting: Pharmacist

## 2012-01-09 ENCOUNTER — Encounter (HOSPITAL_COMMUNITY)
Admission: RE | Admit: 2012-01-09 | Discharge: 2012-01-09 | Disposition: A | Discharge status: Home / Self Care | Payer: Medicare Other | Source: Ambulatory Visit | Attending: Obstetrics and Gynecology | Admitting: Obstetrics and Gynecology

## 2012-01-09 ENCOUNTER — Encounter (HOSPITAL_COMMUNITY): Payer: Self-pay

## 2012-01-09 DIAGNOSIS — Z01818 Encounter for other preprocedural examination: Secondary | ICD-10-CM | POA: Diagnosis not present

## 2012-01-09 DIAGNOSIS — Z01812 Encounter for preprocedural laboratory examination: Secondary | ICD-10-CM | POA: Diagnosis not present

## 2012-01-09 DIAGNOSIS — N393 Stress incontinence (female) (male): Secondary | ICD-10-CM | POA: Diagnosis not present

## 2012-01-09 DIAGNOSIS — N993 Prolapse of vaginal vault after hysterectomy: Secondary | ICD-10-CM | POA: Diagnosis not present

## 2012-01-09 LAB — CBC
HCT: 37.7 % (ref 36.0–46.0)
RDW: 14.5 % (ref 11.5–15.5)
WBC: 5.1 10*3/uL (ref 4.0–10.5)

## 2012-01-09 LAB — BASIC METABOLIC PANEL
BUN: 10 mg/dL (ref 6–23)
Chloride: 99 mEq/L (ref 96–112)
GFR calc Af Amer: >90 mL/min (ref >90)
GFR calc non Af Amer: 90 mL/min — ABNORMAL LOW (ref >90)
Potassium: 2.7 mEq/L — CL (ref 3.5–5.1)
Sodium: 137 mEq/L (ref 135–145)

## 2012-01-09 NOTE — Pre-Procedure Instructions (Addendum)
Pt potassium level returned at 2.7 -pt is on HCTZ 25 qd-Dr Sherron Ales notified. Instructed to call Dr Kittie Plater office and notify her of potassium for correction prior to surgery. To re-check K+ stat day of surgery. Stacy at Dr Henderson Cloud office notified

## 2012-01-09 NOTE — Pre-Procedure Instructions (Signed)
EKG done at [facility] office 8/12-Sinus rhythm-in [facility]

## 2012-01-09 NOTE — Patient Instructions (Addendum)
   Your procedure is scheduled on:Wednesday January 30th  Enter through the [employer] of [facility] at:6am Pick up the phone at the desk and dial [phone] and inform us of your arrival.  Please call this number if you have any problems the morning of surgery: [phone]  Remember: Do not eat food after midnight:Tuesday Do not drink clear liquids after: midnight Tuesday Take these medicines the morning of surgery with a SIP OF WATER: Effexor, Toprol, Hydrodiuril, morning meds  Do not wear jewelry, make-up, or FINGER nail polish Do not wear lotions, powders, perfumes or deodorant. Do not shave 48 hours prior to surgery. Do not bring valuables to the hospital.  Leave suitcase in the car. After Surgery it may be brought to your room. For patients being admitted to the hospital, checkout time is 11:00am the day of discharge.  Patients discharged on the day of surgery will not be allowed to drive home.     Remember to use your hibiclens as instructed.Please shower with 1/2 bottle the evening before your surgery and the other 1/2 bottle the morning of surgery.

## 2012-01-10 ENCOUNTER — Telehealth: Payer: Self-pay

## 2012-01-10 NOTE — Telephone Encounter (Signed)
[facility] called to inform the patient is scheduled to have bladder surgery 01/18/12 with Dr. Henderson Cloud. She had preop labs and her potassium was 2.7, the anesthesiologist is recommending she be put on medication. Please advise, call back number to [facility] is [phone] ext. [phone]

## 2012-01-10 NOTE — Telephone Encounter (Signed)
Left message for Misty Stanley at [facility] to callback office

## 2012-01-10 NOTE — Telephone Encounter (Signed)
Misty Stanley at OB/GYN informed. Appointment scheduled for 01/13/2012 at 9:00am. [facility] will inform pt of appointment.

## 2012-01-10 NOTE — Telephone Encounter (Signed)
Ov here please

## 2012-01-11 ENCOUNTER — Other Ambulatory Visit: Payer: Self-pay | Admitting: Obstetrics and Gynecology

## 2012-01-13 ENCOUNTER — Ambulatory Visit (INDEPENDENT_AMBULATORY_CARE_PROVIDER_SITE_OTHER): Payer: Medicare Other | Admitting: Endocrinology

## 2012-01-13 ENCOUNTER — Encounter: Payer: Self-pay | Admitting: Endocrinology

## 2012-01-13 DIAGNOSIS — C467 Kaposi's sarcoma of other sites: Secondary | ICD-10-CM

## 2012-01-13 DIAGNOSIS — E875 Hyperkalemia: Secondary | ICD-10-CM

## 2012-01-13 DIAGNOSIS — E876 Hypokalemia: Secondary | ICD-10-CM | POA: Diagnosis not present

## 2012-01-13 MED ORDER — TRIAMTERENE-HCTZ 37.5-25 MG PO TABS: 1.0000 | ORAL_TABLET | Freq: Every day | ORAL | Status: DC

## 2012-01-13 MED ORDER — POTASSIUM CHLORIDE CRYS ER 20 MEQ PO TBCR: 20.0000 meq | EXTENDED_RELEASE_TABLET | Freq: Three times a day (TID) | ORAL | Status: DC

## 2012-01-13 NOTE — Patient Instructions (Addendum)
i have sent a prescription to your pharmacy, for some potassium pills, to take 3x a day, x 2 days. Also, i have sent a prescription to your pharmacy, to change your hctz pills to contain an ingredient that prevents the loss of potassium.   blood tests are being requested for you for monday.  please call [phone] to hear your test results.  You will be prompted to enter the 9-digit "MRN" number that appears at the top left of this page, followed by #.  Then you will hear the message.

## 2012-01-13 NOTE — Progress Notes (Signed)
Subjective:    Patient ID: Elizabeth Buckley, female    DOB: February 20, 1951, 61 y.o.   MRN: [id number]  HPI Pt states 1 week of slight weakness throughout the body.  She has slight assoc muscle cramps.  She will have surgery next week by dr Henderson Cloud.   Past Medical History  Diagnosis Date  . ANXIETY 10/04/2007  . BACK PAIN 04/15/2008  . CONSTIPATION, CHRONIC 05/21/2008  . DEPRESSION 10/04/2007  . HYPERLIPIDEMIA 07/04/2007  . HYPERTENSION 10/04/2007  . LOW BACK PAIN 09/22/2009  . NASH (NON-ALCOHOLIC STEATOHEPATITIS) 04/16/2008  . Obesity, unspecified 05/21/2008  . OSTEOPENIA 10/04/2007  . RASH-NONVESICULAR 09/21/2010    Past Surgical History  Procedure Date  . Electrocardiogram 07/27/2006  . Appendectomy   . Cholecystectomy   . Tubal ligation   . Trigger finger repair left thumb   . Cataract surgery     left and right eye  . S/p lumbar surgery   . S/p liver biopsy     Dr. Leone Payor  . Upper gastrointestinal endoscopy 01/03/2003    colon polyps, external hemorrhoids  . Colonoscopy w/ biopsies and polypectomy 01/03/2003    submucosal lesion  . Abdominal hysterectomy     History   Social History  . Marital Status: Married    Spouse Name: N/A    Number of Children: 1  . Years of Education: N/A   Occupational History  . disabled packager    Social History Main Topics  . Smoking status: Former Smoker    Quit date: 01/08/2001  . Smokeless tobacco: Never Used   Comment: stopped 8 years ago  . Alcohol Use: Yes     occasion 3-4 x yr  . Drug Use: No  . Sexually Active: Not on file   Other Topics Concern  . Not on file   Social History Narrative   Daily caffeine, 2 cups dailyPatient does not get regular exercise    Current Outpatient Prescriptions on File Prior to Visit  Medication Sig Dispense Refill  . aspirin 81 MG tablet Take 81 mg by mouth daily.       . Calcium Carbonate (CALCIUM 600 PO) Take 600 mg by mouth 2 (two) times daily.       . Cholecalciferol (VITAMIN D3) 1000  UNITS CAPS Take 5,000 Units by mouth daily.       Marland Kitchen ibuprofen (ADVIL,MOTRIN) 200 MG tablet Take 400 mg by mouth daily.      . metoprolol (TOPROL-XL) 50 MG 24 hr tablet Take 1 tablet (50 mg total) by mouth daily.  90 tablet  3  . polyethylene glycol (MIRALAX / GLYCOLAX) packet Take 17 g by mouth daily as needed. For constipation      . rosuvastatin (CRESTOR) 20 MG tablet Take 1 tablet (20 mg total) by mouth daily.  90 tablet  3  . triamcinolone cream (KENALOG) 0.1 % Apply topically 2 (two) times daily.  30 g  0  . venlafaxine (EFFEXOR-XR) 150 MG 24 hr capsule Take 1 capsule (150 mg total) by mouth daily.  90 capsule  3    Allergies  Allergen Reactions  . Codeine Nausea Only    Family History  Problem Relation Age of Onset  . Heart disease Father   . Alcohol abuse Other   . Hyperlipidemia Other   . Hypertension Other     BP 122/84  Pulse 68  Temp(Src) 96.8 F (36 C) (Oral)  Ht 5\' 3"  (1.6 m)  Wt 215 lb (97.523 kg)  BMI 38.09  kg/m2  SpO2 94%    Review of Systems Denies diarrhea and n/v    Objective:   Physical Exam VITAL SIGNS:  See vs page GENERAL: no distress Ext: no edema   Lab Results  Component Value Date   WBC 5.1 01/09/2012   HGB 12.3 01/09/2012   HCT 37.7 01/09/2012   PLT 267 01/09/2012   GLUCOSE 124* 01/09/2012   CHOL 207* 11/03/2011   TRIG 176.0* 11/03/2011   HDL 68.00 11/03/2011   LDLDIRECT 134.2 11/03/2011   LDLCALC 85 09/22/2009   ALT 24 11/03/2011   AST 31 11/03/2011   NA 137 01/09/2012   K 2.7* 01/09/2012   CL 99 01/09/2012   CREATININE 0.75 01/09/2012   BUN 10 01/09/2012   CO2 27 01/09/2012   TSH 4.89 11/03/2011   INR 1.0 11/10/2008      Assessment & Plan:  Hypokalemia, new.  prob due to HCTZ Cramps, prob due to the hypokalemia HTN, well-controlled

## 2012-01-16 ENCOUNTER — Other Ambulatory Visit (INDEPENDENT_AMBULATORY_CARE_PROVIDER_SITE_OTHER): Payer: Medicare Other

## 2012-01-16 DIAGNOSIS — E876 Hypokalemia: Secondary | ICD-10-CM | POA: Diagnosis not present

## 2012-01-16 LAB — BASIC METABOLIC PANEL
BUN: 12 mg/dL (ref 6–23)
Calcium: 9.8 mg/dL (ref 8.4–10.5)
Creatinine, Ser: 1 mg/dL (ref 0.4–1.2)
GFR: 61.21 mL/min (ref >60.00)
Glucose, Bld: 94 mg/dL (ref 70–99)
Potassium: 4.4 mEq/L (ref 3.5–5.1)

## 2012-01-17 MED ORDER — DEXTROSE 5 % IV SOLN
2.0000 g | INTRAVENOUS | Status: AC
  Administered 2012-01-18: 1 g via INTRAVENOUS
  Filled 2012-01-17: qty 2

## 2012-01-17 NOTE — H&P (Signed)
61 y.o. yo complains of SUI and small cystocele.  No splinting.  Past Medical History  Diagnosis Date  . ANXIETY 10/04/2007  . BACK PAIN 04/15/2008  . CONSTIPATION, CHRONIC 05/21/2008  . DEPRESSION 10/04/2007  . HYPERLIPIDEMIA 07/04/2007  . HYPERTENSION 10/04/2007  . LOW BACK PAIN 09/22/2009  . NASH (NON-ALCOHOLIC STEATOHEPATITIS) 04/16/2008  . Obesity, unspecified 05/21/2008  . OSTEOPENIA 10/04/2007  . RASH-NONVESICULAR 09/21/2010   Past Surgical History  Procedure Date  . Electrocardiogram 07/27/2006  . Appendectomy   . Cholecystectomy   . Tubal ligation   . Trigger finger repair left thumb   . Cataract surgery     left and right eye  . S/p lumbar surgery   . S/p liver biopsy     Dr. Leone Payor  . Upper gastrointestinal endoscopy 01/03/2003    colon polyps, external hemorrhoids  . Colonoscopy w/ biopsies and polypectomy 01/03/2003    submucosal lesion  . Abdominal hysterectomy     History   Social History  . Marital Status: Married    Spouse Name: N/A    Number of Children: 1  . Years of Education: N/A   Occupational History  . disabled packager    Social History Main Topics  . Smoking status: Former Smoker    Quit date: 01/08/2001  . Smokeless tobacco: Never Used   Comment: stopped 8 years ago  . Alcohol Use: Yes     occasion 3-4 x yr  . Drug Use: No  . Sexually Active: Not on file   Other Topics Concern  . Not on file   Social History Narrative   Daily caffeine, 2 cups dailyPatient does not get regular exercise    No current facility-administered medications on file prior to encounter.   Current Outpatient Prescriptions on File Prior to Encounter  Medication Sig Dispense Refill  . aspirin 81 MG tablet Take 81 mg by mouth daily.       . Calcium Carbonate (CALCIUM 600 PO) Take 600 mg by mouth 2 (two) times daily.       . Cholecalciferol (VITAMIN D3) 1000 UNITS CAPS Take 5,000 Units by mouth daily.       . metoprolol (TOPROL-XL) 50 MG 24 hr tablet Take 1 tablet  (50 mg total) by mouth daily.  90 tablet  3  . polyethylene glycol (MIRALAX / GLYCOLAX) packet Take 17 g by mouth daily as needed. For constipation      . rosuvastatin (CRESTOR) 20 MG tablet Take 1 tablet (20 mg total) by mouth daily.  90 tablet  3  . triamcinolone cream (KENALOG) 0.1 % Apply topically 2 (two) times daily.  30 g  0  . venlafaxine (EFFEXOR-XR) 150 MG 24 hr capsule Take 1 capsule (150 mg total) by mouth daily.  90 capsule  3    Allergies  Allergen Reactions  . Codeine Nausea Only    @VITALS2 @  Lungs: clear to ascultation Cor:  RRR Abdomen:  soft, nontender, nondistended. Ex:  no cords, erythema Pelvic:  NEFG, cuff intact but has leftover stitch at top; cystocele -2; no rectocele.   Cystometrics:  Nml capacity, compliance, detrusor.  LPP 45. A:  SUI, cystocele.  Hypokalemia at initial preop labs corrected with PO KCL.   P:  For TVT with A-repair, removal of stitch in cuff.   All risks, benefits and alternatives d/w patient and she desires to proceed. She will receive preop antibiotics and SCDs during the operation.     Elizabeth Buckley A

## 2012-01-18 ENCOUNTER — Ambulatory Visit (HOSPITAL_COMMUNITY): Payer: Medicare Other | Admitting: Anesthesiology

## 2012-01-18 ENCOUNTER — Encounter (HOSPITAL_COMMUNITY)
Admission: RE | Disposition: A | Discharge status: Home / Self Care | Payer: Self-pay | Source: Ambulatory Visit | Attending: Obstetrics and Gynecology

## 2012-01-18 ENCOUNTER — Ambulatory Visit (HOSPITAL_COMMUNITY)
Admission: RE | Admit: 2012-01-18 | Discharge: 2012-01-18 | Disposition: A | Discharge status: Home / Self Care | Payer: Medicare Other | Source: Ambulatory Visit | Attending: Obstetrics and Gynecology | Admitting: Obstetrics and Gynecology

## 2012-01-18 ENCOUNTER — Encounter (HOSPITAL_COMMUNITY): Payer: Self-pay | Admitting: Anesthesiology

## 2012-01-18 ENCOUNTER — Encounter (HOSPITAL_COMMUNITY): Payer: Self-pay | Admitting: *Deleted

## 2012-01-18 DIAGNOSIS — F411 Generalized anxiety disorder: Secondary | ICD-10-CM | POA: Diagnosis not present

## 2012-01-18 DIAGNOSIS — N993 Prolapse of vaginal vault after hysterectomy: Secondary | ICD-10-CM | POA: Insufficient documentation

## 2012-01-18 DIAGNOSIS — N393 Stress incontinence (female) (male): Secondary | ICD-10-CM | POA: Diagnosis not present

## 2012-01-18 DIAGNOSIS — Z01812 Encounter for preprocedural laboratory examination: Secondary | ICD-10-CM | POA: Diagnosis not present

## 2012-01-18 DIAGNOSIS — Z9889 Other specified postprocedural states: Secondary | ICD-10-CM

## 2012-01-18 DIAGNOSIS — Z01818 Encounter for other preprocedural examination: Secondary | ICD-10-CM | POA: Diagnosis not present

## 2012-01-18 DIAGNOSIS — M549 Dorsalgia, unspecified: Secondary | ICD-10-CM | POA: Diagnosis not present

## 2012-01-18 DIAGNOSIS — I10 Essential (primary) hypertension: Secondary | ICD-10-CM | POA: Diagnosis not present

## 2012-01-18 DIAGNOSIS — N8111 Cystocele, midline: Secondary | ICD-10-CM | POA: Diagnosis not present

## 2012-01-18 HISTORY — PX: BLADDER SUSPENSION: SHX72

## 2012-01-18 HISTORY — PX: CYSTOSCOPY: SHX5120

## 2012-01-18 HISTORY — PX: CYSTOCELE REPAIR: SHX163

## 2012-01-18 LAB — POTASSIUM: Potassium: 3.8 mEq/L (ref 3.5–5.1)

## 2012-01-18 SURGERY — URETHROPEXY, USING TRANSVAGINAL TAPE
Anesthesia: General | Site: Vagina | Wound class: Clean Contaminated

## 2012-01-18 MED ORDER — INDIGOTINDISULFONATE SODIUM 8 MG/ML IJ SOLN
INTRAMUSCULAR | Status: DC | PRN
  Administered 2012-01-18: 5 mL via INTRAVENOUS

## 2012-01-18 MED ORDER — ONDANSETRON HCL 4 MG/2ML IJ SOLN
INTRAMUSCULAR | Status: AC
  Filled 2012-01-18: qty 2

## 2012-01-18 MED ORDER — ONDANSETRON HCL 4 MG/2ML IJ SOLN
INTRAMUSCULAR | Status: DC | PRN
  Administered 2012-01-18: 4 mg via INTRAVENOUS

## 2012-01-18 MED ORDER — KETOROLAC TROMETHAMINE 30 MG/ML IJ SOLN
INTRAMUSCULAR | Status: AC
  Filled 2012-01-18: qty 1

## 2012-01-18 MED ORDER — PROPOFOL 10 MG/ML IV EMUL
INTRAVENOUS | Status: AC
  Filled 2012-01-18: qty 20

## 2012-01-18 MED ORDER — FENTANYL CITRATE 0.05 MG/ML IJ SOLN
INTRAMUSCULAR | Status: DC | PRN
  Administered 2012-01-18 (×2): 50 ug via INTRAVENOUS

## 2012-01-18 MED ORDER — LIDOCAINE HCL (CARDIAC) 20 MG/ML IV SOLN
INTRAVENOUS | Status: AC
  Filled 2012-01-18: qty 5

## 2012-01-18 MED ORDER — LIDOCAINE-EPINEPHRINE 0.5-1:200000 % IJ SOLN
INTRAMUSCULAR | Status: AC
  Filled 2012-01-18: qty 1

## 2012-01-18 MED ORDER — FENTANYL CITRATE 0.05 MG/ML IJ SOLN
INTRAMUSCULAR | Status: AC
  Filled 2012-01-18: qty 2

## 2012-01-18 MED ORDER — EPHEDRINE 5 MG/ML INJ
INTRAVENOUS | Status: AC
  Filled 2012-01-18: qty 10

## 2012-01-18 MED ORDER — OXYCODONE-ACETAMINOPHEN 5-325 MG PO TABS
1.0000 | ORAL_TABLET | ORAL | Status: DC | PRN
  Administered 2012-01-18: 1 via ORAL

## 2012-01-18 MED ORDER — LIDOCAINE-EPINEPHRINE 0.5-1:200000 % IJ SOLN
INTRAMUSCULAR | Status: DC | PRN
  Administered 2012-01-18: 10 mL

## 2012-01-18 MED ORDER — LACTATED RINGERS IV SOLN
INTRAVENOUS | Status: DC
  Administered 2012-01-18 (×2): via INTRAVENOUS

## 2012-01-18 MED ORDER — STERILE WATER FOR IRRIGATION IR SOLN
Status: DC | PRN
  Administered 2012-01-18: 1000 mL via INTRAVESICAL

## 2012-01-18 MED ORDER — PROPOFOL 10 MG/ML IV EMUL
INTRAVENOUS | Status: DC | PRN
  Administered 2012-01-18: 150 mg via INTRAVENOUS

## 2012-01-18 MED ORDER — CEFUROXIME AXETIL 250 MG PO TABS: 250.0000 mg | ORAL_TABLET | Freq: Two times a day (BID) | ORAL | Status: AC

## 2012-01-18 MED ORDER — GLYCOPYRROLATE 0.2 MG/ML IJ SOLN
INTRAMUSCULAR | Status: AC
  Filled 2012-01-18: qty 1

## 2012-01-18 MED ORDER — OXYCODONE-ACETAMINOPHEN 5-325 MG PO TABS
ORAL_TABLET | ORAL | Status: AC
  Administered 2012-01-18: 1 via ORAL
  Filled 2012-01-18: qty 1

## 2012-01-18 MED ORDER — ESTRADIOL 0.1 MG/GM VA CREA
TOPICAL_CREAM | VAGINAL | Status: DC | PRN
  Administered 2012-01-18: 1 via VAGINAL

## 2012-01-18 MED ORDER — INDIGOTINDISULFONATE SODIUM 8 MG/ML IJ SOLN
INTRAMUSCULAR | Status: DC | PRN
  Administered 2012-01-18: 40 mg via INTRAVENOUS

## 2012-01-18 MED ORDER — MEPERIDINE HCL 25 MG/ML IJ SOLN: 6.2500 mg | INTRAMUSCULAR | Status: DC | PRN

## 2012-01-18 MED ORDER — KETOROLAC TROMETHAMINE 30 MG/ML IJ SOLN
INTRAMUSCULAR | Status: DC | PRN
  Administered 2012-01-18: 15 mg via INTRAVENOUS

## 2012-01-18 MED ORDER — INDIGOTINDISULFONATE SODIUM 8 MG/ML IJ SOLN
INTRAMUSCULAR | Status: AC
  Filled 2012-01-18: qty 5

## 2012-01-18 MED ORDER — METOCLOPRAMIDE HCL 5 MG/ML IJ SOLN: 10.0000 mg | Freq: Once | INTRAMUSCULAR | Status: DC | PRN

## 2012-01-18 MED ORDER — EPHEDRINE SULFATE 50 MG/ML IJ SOLN
INTRAMUSCULAR | Status: DC | PRN
  Administered 2012-01-18: 15 mg via INTRAVENOUS
  Administered 2012-01-18 (×2): 10 mg via INTRAVENOUS

## 2012-01-18 MED ORDER — MIDAZOLAM HCL 2 MG/2ML IJ SOLN
INTRAMUSCULAR | Status: AC
  Filled 2012-01-18: qty 2

## 2012-01-18 MED ORDER — ESTRADIOL 0.1 MG/GM VA CREA
TOPICAL_CREAM | VAGINAL | Status: AC
  Filled 2012-01-18: qty 42.5

## 2012-01-18 MED ORDER — OXYCODONE-ACETAMINOPHEN 5-325 MG PO TABS: 1.0000 | ORAL_TABLET | ORAL | Status: AC | PRN

## 2012-01-18 MED ORDER — LIDOCAINE HCL (CARDIAC) 20 MG/ML IV SOLN
INTRAVENOUS | Status: DC | PRN
  Administered 2012-01-18: 30 mg via INTRAVENOUS

## 2012-01-18 MED ORDER — MIDAZOLAM HCL 5 MG/5ML IJ SOLN
INTRAMUSCULAR | Status: DC | PRN
  Administered 2012-01-18: 1 mg via INTRAVENOUS

## 2012-01-18 SURGICAL SUPPLY — 29 items
ADH SKN CLS APL DERMABOND .7 (GAUZE/BANDAGES/DRESSINGS) ×3
BLADE SURG 15 STRL LF C SS BP (BLADE) ×3 IMPLANT
BLADE SURG 15 STRL SS (BLADE) ×4
CANISTER SUCTION 2500CC (MISCELLANEOUS) ×4 IMPLANT
CLOTH BEACON ORANGE TIMEOUT ST (SAFETY) ×4 IMPLANT
DECANTER SPIKE VIAL GLASS SM (MISCELLANEOUS) ×1 IMPLANT
DERMABOND ADVANCED (GAUZE/BANDAGES/DRESSINGS) ×1
DERMABOND ADVANCED .7 DNX12 (GAUZE/BANDAGES/DRESSINGS) ×3 IMPLANT
GAUZE PACKING 2X5 YD STERILE (GAUZE/BANDAGES/DRESSINGS) ×4 IMPLANT
GLOVE BIO SURGEON STRL SZ7 (GLOVE) ×8 IMPLANT
GOWN PREVENTION PLUS LG XLONG (DISPOSABLE) ×14 IMPLANT
GOWN STRL REIN XL XLG (GOWN DISPOSABLE) ×3 IMPLANT
NEEDLE HYPO 22GX1.5 SAFETY (NEEDLE) ×4 IMPLANT
NS IRRIG 1000ML POUR BTL (IV SOLUTION) ×4 IMPLANT
PACK VAGINAL WOMENS (CUSTOM PROCEDURE TRAY) ×4 IMPLANT
PLUG CATH AND CAP STER (CATHETERS) ×4 IMPLANT
SET CYSTO W/LG BORE CLAMP LF (SET/KITS/TRAYS/PACK) ×4 IMPLANT
SLING TRANS VAGINAL TAPE (Sling) ×1 IMPLANT
SLING UTERINE/ABD GYNECARE TVT (Sling) ×3 IMPLANT
SPONGE SURGIFOAM ABS GEL 12-7 (HEMOSTASIS) ×1 IMPLANT
SUT VIC AB 0 CT1 27 (SUTURE) ×8
SUT VIC AB 0 CT1 27XBRD ANBCTR (SUTURE) ×6 IMPLANT
SUT VIC AB 2-0 CT1 27 (SUTURE) ×8
SUT VIC AB 2-0 CT1 TAPERPNT 27 (SUTURE) IMPLANT
SUT VIC AB 2-0 SH 27 (SUTURE) ×12
SUT VIC AB 2-0 SH 27XBRD (SUTURE) ×9 IMPLANT
TOWEL OR 17X24 6PK STRL BLUE (TOWEL DISPOSABLE) ×8 IMPLANT
TRAY FOLEY CATH 14FR (SET/KITS/TRAYS/PACK) ×4 IMPLANT
WATER STERILE IRR 1000ML POUR (IV SOLUTION) ×4 IMPLANT

## 2012-01-18 NOTE — Transfer of Care (Signed)
Immediate Anesthesia Transfer of Care Note  Patient: Elizabeth Buckley  Procedure(s) Performed:  TRANSVAGINAL TAPE (TVT) PROCEDURE; ANTERIOR REPAIR (CYSTOCELE); CYSTOSCOPY  Patient Location: PACU  Anesthesia Type: General  Level of Consciousness: awake and oriented  Airway & Oxygen Therapy: Patient Spontanous Breathing and Patient connected to nasal cannula oxygen  Post-op Assessment: Report given to PACU RN, Post -op Vital signs reviewed and stable and Patient moving all extremities  Post vital signs: Reviewed and stable  Complications: No apparent anesthesia complications

## 2012-01-18 NOTE — Brief Op Note (Deleted)
01/18/2012  9:52 AM  PATIENT:  Elizabeth Buckley  61 y.o. female  PRE-OPERATIVE DIAGNOSIS:  Stress Urinary Incontinence, cystocele  POST-OPERATIVE DIAGNOSIS:  Stress Urinary Incontinence, cystocele  PROCEDURE:  Procedure(s): TRANSVAGINAL TAPE (TVT) PROCEDURE ANTERIOR REPAIR (CYSTOCELE) CYSTOSCOPY  SURGEON:  Surgeon(s): Loney Laurence, MD Philip Aspen, DO  ASSISTANTS: Dr. Claiborne Billings   ANESTHESIA:   general  EBL:  Total I/O In: 800 [I.V.:800] Out: 55 [Urine:30; Blood:25]  BLOOD ADMINISTERED:none  DRAINS: none   LOCAL MEDICATIONS USED:  XYLOCAINE 13CC  SPECIMEN:  No Specimen  DISPOSITION OF SPECIMEN:  N/A  COUNTS:  YES  DICTATION: see below.  PLAN OF CARE: Discharge to home after PACU  PATIENT DISPOSITION:  PACU - hemodynamically stable.   Delay start of Pharmacological VTE agent (>24hrs) due to surgical blood loss or risk of bleeding: NOT APPLICABLE:20182  After general anesthesia was administered, the patient was prepped and draped in the usual sterile fashion.  The uterus was well suspended and we elected to leave it in place.  A foley was placed in the urethra and the apex of the cystocele grasped with two allises.  Allises were used to grasp the vaginal mucosa in the midline superior to inferior just below the urethral opening.  The mucosa was injected with 1% lidocaine with epi in the midline and a scalpel used to incise the vaginal mucosa vertically.  Allises were used to retract the vaginal mucosa as the vesico-vaginal fascia was removed from the mucosa with blunt and sharp dissection and adequate room midurethral to pelvic floor bilaterally was developed.  Two small puncture incisions were then made two cm from midline just above the pubic symphysis.  The abdominal needles from the Gynecare TVT set were introduced behind the pubic bone, through the pelvic floor and out the vagina carefully on each respective side, being careful to hug the posterior of the pubic  bone.  The foley was removed and indigo carmine given.  Cystoscopy revealed excellent bilateral spill from each ureteral orifice and NO NEEDLES IN THE BLADDER.   The urethra was clear as well.  The foley was replaced and the vaginal needles attached to the abdominal needles.  The tape was pulled into place through the abdominal incisions, the sheaths removed while a kelly was in place under the mesh sling and the tape cut at the level of just below the skin.  The tape was checked and found to be tight enough and laying flat.  Gelfoam was placed in the corners up by the pelvic floor to achieve hemostasis of some small bleeders out of reach and too close to the sling for stitches.  Once hemostasis was achieved, three mattress stitches of 0-vicryl were placed to close the vesico-vaginal fascia under the bladder.  The vaginal mucosa was trimmed and then closed with a running lock stitch of 2-0 vicryl.  A vaginal packing with estrace was placed and the patient returned to the recovery room in stable condition.     Ammy Lienhard A

## 2012-01-18 NOTE — Anesthesia Postprocedure Evaluation (Signed)
Anesthesia Post Note  Patient: Elizabeth Buckley  Procedure(s) Performed:  TRANSVAGINAL TAPE (TVT) PROCEDURE; ANTERIOR REPAIR (CYSTOCELE); CYSTOSCOPY  Anesthesia type: GA  Patient location: PACU  Post pain: Pain level controlled  Post assessment: Post-op Vital signs reviewed  Last Vitals:  Filed Vitals:   01/18/12 1000  BP: 121/68  Pulse: 73  Temp:   Resp: 17    Post vital signs: Reviewed  Level of consciousness: sedated  Complications: No apparent anesthesia complications

## 2012-01-18 NOTE — Op Note (Deleted)
01/18/2012  9:52 AM  PATIENT:  Elizabeth Buckley  61 y.o. female  PRE-OPERATIVE DIAGNOSIS:  Stress Urinary Incontinence, cystocele  POST-OPERATIVE DIAGNOSIS:  Stress Urinary Incontinence, cystocele  PROCEDURE:  Procedure(s): TRANSVAGINAL TAPE (TVT) PROCEDURE ANTERIOR REPAIR (CYSTOCELE) CYSTOSCOPY  SURGEON:  Surgeon(s): Loney Laurence, MD Philip Aspen, DO  ASSISTANTS: Dr. Claiborne Billings   ANESTHESIA:   general  EBL:  Total I/O In: 800 [I.V.:800] Out: 55 [Urine:30; Blood:25]  BLOOD ADMINISTERED:none  DRAINS: none   LOCAL MEDICATIONS USED:  XYLOCAINE 13CC  SPECIMEN:  No Specimen  DISPOSITION OF SPECIMEN:  N/A  COUNTS:  YES  DICTATION: see below.  PLAN OF CARE: Discharge to home after PACU  PATIENT DISPOSITION:  PACU - hemodynamically stable.   Delay start of Pharmacological VTE agent (>24hrs) due to surgical blood loss or risk of bleeding: NOT APPLICABLE:20182  After general anesthesia was administered, the patient was prepped and draped in the usual sterile fashion.  The uterus was well suspended and we elected to leave it in place.  A foley was placed in the urethra and the apex of the cystocele grasped with two allises.  Allises were used to grasp the vaginal mucosa in the midline superior to inferior just below the urethral opening.  The mucosa was injected with 1% lidocaine with epi in the midline and a scalpel used to incise the vaginal mucosa vertically.  Allises were used to retract the vaginal mucosa as the vesico-vaginal fascia was removed from the mucosa with blunt and sharp dissection and adequate room midurethral to pelvic floor bilaterally was developed.  Two small puncture incisions were then made two cm from midline just above the pubic symphysis.  The abdominal needles from the Gynecare TVT set were introduced behind the pubic bone, through the pelvic floor and out the vagina carefully on each respective side, being careful to hug the posterior of the pubic  bone.  The foley was removed and indigo carmine given.  Cystoscopy revealed excellent bilateral spill from each ureteral orifice and NO NEEDLES IN THE BLADDER.   The urethra was clear as well.  The foley was replaced and the vaginal needles attached to the abdominal needles.  The tape was pulled into place through the abdominal incisions, the sheaths removed while a kelly was in place under the mesh sling and the tape cut at the level of just below the skin.  The tape was checked and found to be tight enough and laying flat.   Gelfoam  was placed in the corners up by the pelvic floor to achieve hemostasis of some small bleeders out of reach and too close to the sling for stitches.  Once hemostasis was achieved, three mattress stitches of 0-vicryl were placed to close the vesico-vaginal fascia under the bladder.  The vaginal mucosa was trimmed and then closed with a running lock stitch of 2-0 vicryl.  A vaginal packing with estrace was placed and the patient returned to the recovery room in stable condition.

## 2012-01-18 NOTE — Progress Notes (Signed)
There has been no change in the patients history, status or exam since the history and physical except that her potassium is now the correct level.  Filed Vitals:   01/18/12 0622  BP: 132/82  Pulse: 75  Temp: 98.1 F (36.7 C)  Resp: 18  SpO2: 97%    Results for orders placed during the hospital encounter of 01/18/12 (from the past 24 hour(s))  POTASSIUM     Status: Normal   Collection Time   01/18/12  6:09 AM      Component Value Range   Potassium 3.8  3.5 - 5.1 (mEq/L)   Lab Results  Component Value Date   WBC 5.1 01/09/2012   HGB 12.3 01/09/2012   HCT 37.7 01/09/2012   MCV 91.1 01/09/2012   PLT 267 01/09/2012    Tracie Lindbloom A

## 2012-01-18 NOTE — Discharge Instructions (Signed)
A vagtinal packing is in place- come to office tomorrow for removal.  Please keep foley in place- give leg bag and show patient how to switch out bags.

## 2012-01-18 NOTE — Op Note (Addendum)
01/18/2012  8:12 AM  PATIENT:  Elizabeth Buckley  61 y.o. female  PRE-OPERATIVE DIAGNOSIS:  Stress Urinary Incontinence, cystocele, retained vaginal stitch  POST-OPERATIVE DIAGNOSIS:  Stress Urinary Incontinence, cystocele, retained vaginal stitch  PROCEDURE:  Procedure(s): TRANSVAGINAL TAPE (TVT) PROCEDURE ANTERIOR REPAIR (CYSTOCELE) CYSTOSCOPY  SURGEON:  Surgeon(s): Loney Laurence, MD Philip Aspen, DO  ASSISTANTS: Dr. Claiborne Billings   ANESTHESIA:   general  EBL:  Total I/O In: -  Out: 30 [Urine:30]  BLOOD ADMINISTERED:none  DRAINS: none   LOCAL MEDICATIONS USED:  XYLOCAINE 10CC  SPECIMEN:  No Specimen  DISPOSITION OF SPECIMEN:  N/A  COUNTS:  YES  DICTATION: see below.  PLAN OF CARE: Discharge to home after PACU  PATIENT DISPOSITION:  PACU - hemodynamically stable.   Delay start of Pharmacological VTE agent (>24hrs) due to surgical blood loss or risk of bleeding: NOT APPLICABLE:20182  After general anesthesia was administered, the patient was prepped and draped in the usual sterile fashion.  A foley was placed in the urethra and the apex of the cystocele grasped with two allises.  The permanent stitch at top of cuff was grasped with an Allis and removed with a scissors.  Allises were used to grasp the vaginal mucosa in the midline superior to inferior just below the urethral opening.  The mucosa was injected with 1% lidocaine with epi in the midline and a scalpel used to incise the vaginal mucosa vertically.  Allises were used to retract the vaginal mucosa as the vesico-vaginal fascia was removed from the mucosa with blunt and sharp dissection.  Adequate room was achieved. Two small puncture incisions were then made two cm from midline just above the pubic symphysis.  The abdominal needles from the Gynecare TVT set were introduced behind the pubic bone, through the pelvic floor and out the vagina carefully on each respective side, being careful to hug the posterior of  the pubic bone.  The foley was removed and indigo carmine given.  Cystoscopy revealed excellent bilateral spill from each ureteral orifice and NO NEEDLES IN THE BLADDER.   The urethra was clear as well.  The foley was replaced and the vaginal needles attached to the abdominal needles.  The tape was pulled into place through the abdominal incisions, the sheaths removed while a kelly was in place under the mesh sling and the tape cut at the level of just below the skin.  The tape was checked and found to be tight enough and laying flat. Gel foam was placed in the corners up by the pelvic floor to achieve hemostasis of some small bleeders out of reach and too close to the sling for stitches.  Once hemostasis was achieved, one mattress stitches of 0-vicryl were placed to close the vesico-vaginal fascia under the bladder.  The vaginal mucosa was trimmed and then closed with a running lock stitch of 2-0 vicryl.  A vaginal packing with estrace was placed and the patient returned to the recovery room in stable condition.    Evana Runnels A

## 2012-01-18 NOTE — Anesthesia Preprocedure Evaluation (Signed)
Anesthesia Evaluation  Patient identified by MRN, date of birth, ID band Patient awake    Reviewed: Allergy & Precautions, H&P , NPO status , Patient's Chart, lab work & pertinent test results, reviewed documented beta blocker date and time   Airway Mallampati: III TM Distance: >3 FB Neck ROM: Full    Dental No notable dental hx. (+) Teeth Intact   Pulmonary sleep apnea and Continuous Positive Airway Pressure Ventilation ,  clear to auscultation  Pulmonary exam normal       Cardiovascular hypertension, Pt. on medications and Pt. on home beta blockers Regular Normal    Neuro/Psych Anxiety Depression Negative Neurological ROS     GI/Hepatic negative GI ROS, (+) Hepatitis -, UnspecifiedNon-Alcoholic Steatohepatitis   Endo/Other  Hypothyroidism Morbid obesityHyperlipidemia  Renal/GU negative Renal ROS  Genitourinary negative   Musculoskeletal negative musculoskeletal ROS (+)   Abdominal (+) obese,   Peds  Hematology negative hematology ROS (+)   Anesthesia Other Findings   Reproductive/Obstetrics negative OB ROS                           Anesthesia Physical Anesthesia Plan  ASA: III  Anesthesia Plan: General   Post-op Pain Management:    Induction:   Airway Management Planned: LMA  Additional Equipment:   Intra-op Plan:   Post-operative Plan: Extubation in OR  Informed Consent: I have reviewed the patients History and Physical, chart, labs and discussed the procedure including the risks, benefits and alternatives for the proposed anesthesia with the patient or authorized representative who has indicated his/her understanding and acceptance.   Dental advisory given  Plan Discussed with: CRNA, Anesthesiologist and Surgeon  Anesthesia Plan Comments:         Anesthesia Quick Evaluation

## 2012-01-18 NOTE — Brief Op Note (Addendum)
01/18/2012  8:12 AM  PATIENT:  Elizabeth Buckley  61 y.o. female  PRE-OPERATIVE DIAGNOSIS:  Stress Urinary Incontinence, cystocele, retained vaginal stitch  POST-OPERATIVE DIAGNOSIS:  Stress Urinary Incontinence, cystocele, retained vaginal stitch  PROCEDURE:  Procedure(s): TRANSVAGINAL TAPE (TVT) PROCEDURE ANTERIOR REPAIR (CYSTOCELE) CYSTOSCOPY  SURGEON:  Surgeon(s): Sanae Willetts Buckley Tytan Sandate, MD Sidney Callahan, DO  ASSISTANTS: Dr. Callahan   ANESTHESIA:   general  EBL:  Total I/O In: -  Out: 30 [Urine:30]  BLOOD ADMINISTERED:none  DRAINS: none   LOCAL MEDICATIONS USED:  XYLOCAINE 10CC  SPECIMEN:  No Specimen  DISPOSITION OF SPECIMEN:  N/Buckley  COUNTS:  YES  DICTATION: see below.  PLAN OF CARE: Discharge to home after PACU  PATIENT DISPOSITION:  PACU - hemodynamically stable.   Delay start of Pharmacological VTE agent (>24hrs) due to surgical blood loss or risk of bleeding: NOT APPLICABLE:20182  After general anesthesia was administered, the patient was prepped and draped in the usual sterile fashion.  Buckley foley was placed in the urethra and the apex of the cystocele grasped with two allises.  The permanent stitch at top of cuff was grasped with an Allis and removed with Buckley scissors.  Allises were used to grasp the vaginal mucosa in the midline superior to inferior just below the urethral opening.  The mucosa was injected with 1% lidocaine with epi in the midline and Buckley scalpel used to incise the vaginal mucosa vertically.  Allises were used to retract the vaginal mucosa as the vesico-vaginal fascia was removed from the mucosa with blunt and sharp dissection.  Adequate room was achieved. Two small puncture incisions were then made two cm from midline just above the pubic symphysis.  The abdominal needles from the Gynecare TVT set were introduced behind the pubic bone, through the pelvic floor and out the vagina carefully on each respective side, being careful to hug the posterior of  the pubic bone.  The foley was removed and indigo carmine given.  Cystoscopy revealed excellent bilateral spill from each ureteral orifice and NO NEEDLES IN THE BLADDER.   The urethra was clear as well.  The foley was replaced and the vaginal needles attached to the abdominal needles.  The tape was pulled into place through the abdominal incisions, the sheaths removed while Buckley kelly was in place under the mesh sling and the tape cut at the level of just below the skin.  The tape was checked and found to be tight enough and laying flat. Gel foam was placed in the corners up by the pelvic floor to achieve hemostasis of some small bleeders out of reach and too close to the sling for stitches.  Once hemostasis was achieved, one mattress stitches of 0-vicryl were placed to close the vesico-vaginal fascia under the bladder.  The vaginal mucosa was trimmed and then closed with Buckley running lock stitch of 2-0 vicryl.  Buckley vaginal packing with estrace was placed and the patient returned to the recovery room in stable condition.    Elizabeth Buckley  

## 2012-01-19 ENCOUNTER — Encounter (HOSPITAL_COMMUNITY): Payer: Self-pay | Admitting: Obstetrics and Gynecology

## 2012-02-20 ENCOUNTER — Ambulatory Visit (INDEPENDENT_AMBULATORY_CARE_PROVIDER_SITE_OTHER): Payer: Medicare Other | Admitting: Internal Medicine

## 2012-02-20 ENCOUNTER — Ambulatory Visit (INDEPENDENT_AMBULATORY_CARE_PROVIDER_SITE_OTHER)
Admission: RE | Admit: 2012-02-20 | Discharge: 2012-02-20 | Disposition: A | Discharge status: Home / Self Care | Payer: Medicare Other | Source: Ambulatory Visit | Attending: Internal Medicine | Admitting: Internal Medicine

## 2012-02-20 ENCOUNTER — Encounter: Payer: Self-pay | Admitting: Internal Medicine

## 2012-02-20 VITALS — BP 102/72 | HR 98 | Temp 100.5°F

## 2012-02-20 DIAGNOSIS — J029 Acute pharyngitis, unspecified: Secondary | ICD-10-CM

## 2012-02-20 DIAGNOSIS — R05 Cough: Secondary | ICD-10-CM

## 2012-02-20 DIAGNOSIS — R059 Cough, unspecified: Secondary | ICD-10-CM | POA: Diagnosis not present

## 2012-02-20 DIAGNOSIS — J209 Acute bronchitis, unspecified: Secondary | ICD-10-CM | POA: Diagnosis not present

## 2012-02-20 DIAGNOSIS — I10 Essential (primary) hypertension: Secondary | ICD-10-CM | POA: Diagnosis not present

## 2012-02-20 MED ORDER — PROMETHAZINE-DM 6.25-15 MG/5ML PO SYRP: 5.0000 mL | ORAL_SOLUTION | Freq: Four times a day (QID) | ORAL | Status: AC | PRN

## 2012-02-20 MED ORDER — LEVOFLOXACIN 500 MG PO TABS: 500.0000 mg | ORAL_TABLET | Freq: Every day | ORAL | Status: AC

## 2012-02-20 MED ORDER — LIDOCAINE VISCOUS 2 % MT SOLN: 10.0000 mL | OROMUCOSAL | Status: AC | PRN

## 2012-02-20 NOTE — Patient Instructions (Signed)
It was good to see you today. Levaquin antibiotics and promethazine DM cough syrup - also viscous lidocaine as needed for sore throat symptoms - Your prescription(s) have been submitted to your pharmacy. Please take as directed and contact our office if you believe you are having problem(s) with the medication(s). Chest x-ray ordered today. Your results will be called to you after review (48-72hours after test completion). If any changes need to be made, you will be notified at that time. Alternate between ibuprofen and tylenol for aches, pain and fever symptoms as discussed Salt water gargle, rest and hydration Call if symptoms unimproved in 7-10 days, sooner if worse

## 2012-02-20 NOTE — Progress Notes (Signed)
  Subjective:    HPI  complains of cough symptoms  Onset >3 week ago, progressive symptoms in past 3-4days Initially associated with rhinorrhea, sneezing, mild headache and low grade fever Now fatigue, severe sore throat, myalgias, sinus pressure and mod chest congestion >  Cough worse at night but little productive sputum No relief with OTC meds Precipitated by sick contacts  Past Medical History  Diagnosis Date  . ANXIETY   . BACK PAIN   . CONSTIPATION, CHRONIC   . DEPRESSION   . HYPERLIPIDEMIA   . HYPERTENSION   . NASH (NON-ALCOHOLIC STEATOHEPATITIS)   . Obesity, unspecified   . OSTEOPENIA     Review of Systems Constitutional: No night sweats, no unexpected weight change Pulmonary: No pleurisy or hemoptysis Cardiovascular: No chest pain or palpitations     Objective:   Physical Exam BP 102/72  Pulse 98  Temp(Src) 100.5 F (38.1 C) (Oral)  SpO2 94% GEN: ill appearing and fatigued, audible head/chest congestion HENT: NCAT, mild sinus tenderness bilaterally, nares with clear discharge, oropharynx mo erythema, no exudate or thrush Eyes: Vision grossly intact, no conjunctivitis Lungs: Clear to auscultation, but decrease BS at bases - no rhonchi or wheeze, no increased work of breathing at rest Cardiovascular: Regular rate and rhythm, no bilateral edema  Lab Results  Component Value Date   WBC 5.1 01/09/2012   HGB 12.3 01/09/2012   HCT 37.7 01/09/2012   PLT 267 01/09/2012   GLUCOSE 94 01/16/2012   CHOL 207* 11/03/2011   TRIG 176.0* 11/03/2011   HDL 68.00 11/03/2011   LDLDIRECT 134.2 11/03/2011   LDLCALC 85 09/22/2009   ALT 24 11/03/2011   AST 31 11/03/2011   NA 138 01/16/2012   K 3.8 01/18/2012   CL 100 01/16/2012   CREATININE 1.0 01/16/2012   BUN 12 01/16/2012   CO2 31 01/16/2012   TSH 4.89 11/03/2011   INR 1.0 11/10/2008       Assessment & Plan:  Viral URI >1 month ago Bronchitis vs bronchopneumonia Pharyngitis, acute - no exudate Cough, related to  above    Empiric antibiotics prescribed due to symptom duration greater than 7 days - levaquin x 10d Prescription cough suppression, intol of narcotics - Prometh DM - new prescriptions done Viscous lidocaine for throat pain Symptomatic care with Tylenol or Advil, hydration and rest -  salt gargle advised as needed

## 2012-08-23 ENCOUNTER — Other Ambulatory Visit: Payer: Self-pay | Admitting: Obstetrics and Gynecology

## 2012-08-23 DIAGNOSIS — Z1231 Encounter for screening mammogram for malignant neoplasm of breast: Secondary | ICD-10-CM

## 2012-09-10 ENCOUNTER — Telehealth: Payer: Self-pay

## 2012-09-10 NOTE — Telephone Encounter (Signed)
Called the patient and she will pickup hardcopy at the front desk.

## 2012-09-10 NOTE — Telephone Encounter (Signed)
Done hardcopy to robin  

## 2012-09-10 NOTE — Telephone Encounter (Signed)
Pt is requesting Rx for Shingles vaccine to [facility] in [place]

## 2012-09-19 ENCOUNTER — Ambulatory Visit
Admission: RE | Admit: 2012-09-19 | Discharge: 2012-09-19 | Disposition: A | Discharge status: Home / Self Care | Payer: Medicare Other | Source: Ambulatory Visit | Attending: Obstetrics and Gynecology | Admitting: Obstetrics and Gynecology

## 2012-09-19 DIAGNOSIS — Z1231 Encounter for screening mammogram for malignant neoplasm of breast: Secondary | ICD-10-CM | POA: Diagnosis not present

## 2012-11-20 ENCOUNTER — Encounter: Payer: Self-pay | Admitting: Internal Medicine

## 2012-11-29 DIAGNOSIS — H40059 Ocular hypertension, unspecified eye: Secondary | ICD-10-CM | POA: Diagnosis not present

## 2012-12-05 DIAGNOSIS — Z23 Encounter for immunization: Secondary | ICD-10-CM | POA: Diagnosis not present

## 2012-12-15 ENCOUNTER — Other Ambulatory Visit: Payer: Self-pay | Admitting: Internal Medicine

## 2012-12-17 ENCOUNTER — Encounter: Payer: Self-pay | Admitting: Internal Medicine

## 2013-01-04 ENCOUNTER — Other Ambulatory Visit: Payer: Self-pay | Admitting: Internal Medicine

## 2013-01-07 ENCOUNTER — Ambulatory Visit (AMBULATORY_SURGERY_CENTER): Payer: Medicare Other | Admitting: *Deleted

## 2013-01-07 VITALS — Ht 63.0 in | Wt 214.0 lb

## 2013-01-07 DIAGNOSIS — Z1211 Encounter for screening for malignant neoplasm of colon: Secondary | ICD-10-CM

## 2013-01-07 MED ORDER — NA SULFATE-K SULFATE-MG SULF 17.5-3.13-1.6 GM/177ML PO SOLN: ORAL | Status: DC

## 2013-01-07 NOTE — Progress Notes (Signed)
No allergy to eggs or soy products 

## 2013-01-15 ENCOUNTER — Telehealth: Payer: Self-pay | Admitting: Internal Medicine

## 2013-01-15 NOTE — Telephone Encounter (Signed)
Ok for nancy to offer regina for tomorrow

## 2013-01-15 NOTE — Telephone Encounter (Signed)
Patient informed. 

## 2013-01-15 NOTE — Telephone Encounter (Signed)
Patient Information:  Caller Name: Hatice  Phone: [phone]  Patient: Elizabeth Buckley, Elizabeth Buckley  Gender: Female  DOB: 1951/02/06  Age: 62 Years  PCP: Oliver Barre (Adults only)  Office Follow Up:  Does the office need to follow up with this patient?: Yes  Instructions For The Office: refused appt. due the weather and being 20 miles away  RN Note:  Denies any new products or makeup.  Symptoms  Reason For Call & Symptoms: Upper eyelids are itching and  peeling/flaking off and bright red in color.  Has some drainage from both eyes but unsure of the color.  No fever.  Has had these sx for a week but today it is worse.  Has swelling in both eyes also.  Reviewed Health History In EMR: Yes  Reviewed Medications In EMR: Yes  Reviewed Allergies In EMR: Yes  Reviewed Surgeries / Procedures: Yes  Date of Onset of Symptoms: 01/08/2013  Treatments Tried: Natural Tear drops  Treatments Tried Worked: No  Guideline(s) Used:  Eye Pain  Disposition Per Guideline:   Go to Office Now  Reason For Disposition Reached:   Eyelids are very swollen (shut or almost) and no fever  Advice Given:  N/A  Patient Refused Recommendation:  Patient Refused Care Advice  20 miles from the office, husband refuses to bring her in today.  Suggested an UC closer to their home.  She said that she would see if they could get there, if not will see if she can get to the office in am.  Will call for appt.

## 2013-01-16 ENCOUNTER — Ambulatory Visit: Payer: Medicare Other | Admitting: Internal Medicine

## 2013-01-17 ENCOUNTER — Ambulatory Visit (INDEPENDENT_AMBULATORY_CARE_PROVIDER_SITE_OTHER): Payer: Medicare Other | Admitting: Internal Medicine

## 2013-01-17 ENCOUNTER — Encounter: Payer: Self-pay | Admitting: Internal Medicine

## 2013-01-17 VITALS — BP 120/70 | HR 66 | Temp 97.0°F | Ht 63.0 in | Wt 212.2 lb

## 2013-01-17 DIAGNOSIS — L309 Dermatitis, unspecified: Secondary | ICD-10-CM

## 2013-01-17 DIAGNOSIS — I10 Essential (primary) hypertension: Secondary | ICD-10-CM | POA: Diagnosis not present

## 2013-01-17 DIAGNOSIS — E785 Hyperlipidemia, unspecified: Secondary | ICD-10-CM | POA: Diagnosis not present

## 2013-01-17 DIAGNOSIS — H1045 Other chronic allergic conjunctivitis: Secondary | ICD-10-CM

## 2013-01-17 DIAGNOSIS — L259 Unspecified contact dermatitis, unspecified cause: Secondary | ICD-10-CM

## 2013-01-17 DIAGNOSIS — H1013 Acute atopic conjunctivitis, bilateral: Secondary | ICD-10-CM

## 2013-01-17 MED ORDER — ROSUVASTATIN CALCIUM 20 MG PO TABS: 20.0000 mg | ORAL_TABLET | Freq: Every day | ORAL | Status: DC

## 2013-01-17 MED ORDER — METHYLPREDNISOLONE ACETATE 80 MG/ML IJ SUSP
80.0000 mg | Freq: Once | INTRAMUSCULAR | Status: AC
  Administered 2013-01-17: 80 mg via INTRAMUSCULAR

## 2013-01-17 MED ORDER — PREDNISONE 10 MG PO TABS: ORAL_TABLET | ORAL | Status: DC

## 2013-01-17 MED ORDER — TRIAMCINOLONE ACETONIDE 0.1 % EX CREA: TOPICAL_CREAM | Freq: Two times a day (BID) | CUTANEOUS | Status: DC

## 2013-01-17 MED ORDER — METOPROLOL SUCCINATE ER 50 MG PO TB24: 50.0000 mg | ORAL_TABLET | Freq: Every day | ORAL | Status: DC

## 2013-01-17 MED ORDER — VENLAFAXINE HCL ER 150 MG PO CP24: 150.0000 mg | ORAL_CAPSULE | Freq: Every day | ORAL | Status: DC

## 2013-01-17 NOTE — Patient Instructions (Addendum)
You had the steroid shot today Please take all new medication as prescribed - the prednisone for 5 days only, and the topical cream for around the eyes only Please continue all other medications as before, and refills have been done if requested Please have the pharmacy call with any other refills you may need. Please go to the LAB in the Basement (turn left off the elevator) for the tests to be done today You will be contacted by phone if any changes need to be made immediately.  Otherwise, you will receive a letter about your results with an explanation, but please check with MyChart first. To Log into My Chart online, please go by [facility] or [employer] to [email or web address], or download the MyChart App from the [employer] .  Your Username is: [email or web address] ([email or web address]) Please send a practice Message on Mychart later today. Please return in 6 months, or sooner if needed OK to cancel the Feb 2014 appointment

## 2013-01-17 NOTE — Assessment & Plan Note (Signed)
stable overall by history and exam, recent data reviewed with pt, and pt to continue medical treatment as before,  to f/u any worsening symptoms or concerns Lab Results  Component Value Date   LDLCALC 85 09/22/2009

## 2013-01-17 NOTE — Progress Notes (Signed)
Subjective:    Patient ID: Elizabeth Buckley, female    DOB: October 21, 1951, 62 y.o.   MRN: [id number]  HPI  Here to f/u; overall doing ok,  Pt denies chest pain, increased sob or doe, wheezing, orthopnea, PND, increased LE swelling, palpitations, dizziness or syncope.  Pt denies polydipsia, polyuria, or low sugar symptoms such as weakness or confusion improved with po intake.  Pt denies new neurological symptoms such as new headache, or facial or extremity weakness or numbness.   Pt states overall good compliance with meds, has been trying to follow lower cholesterol, diabetic diet, with wt overall stable,  but little exercise however. Does have rather marked nontender erythema to the bilat eyelids with swelling and clearish itchy eye drainage for 2 wks.  Not sure if related to her clinique makeup, but has not used since the onset of the redness.   Pt denies fever, wt loss, night sweats, loss of appetite, or other constitutional symptoms Past Medical History  Diagnosis Date  . ANXIETY   . BACK PAIN   . CONSTIPATION, CHRONIC   . DEPRESSION   . HYPERLIPIDEMIA   . HYPERTENSION   . NASH (NON-ALCOHOLIC STEATOHEPATITIS)   . Obesity, unspecified   . OSTEOPENIA   . Cataract   . Anemia   . GERD (gastroesophageal reflux disease)   . Pneumonia     x3   Past Surgical History  Procedure Date  . Electrocardiogram 07/27/2006  . Appendectomy   . Cholecystectomy   . Tubal ligation   . Trigger finger repair left thumb   . Cataract surgery     left and right eye  . S/p lumbar surgery   . S/p liver biopsy     Dr. Leone Payor  . Upper gastrointestinal endoscopy 01/03/2003    colon polyps, external hemorrhoids  . Colonoscopy w/ biopsies and polypectomy 01/03/2003    submucosal lesion  . Abdominal hysterectomy   . Bladder suspension 01/18/2012    Procedure: TRANSVAGINAL TAPE (TVT) PROCEDURE;  Surgeon: Loney Laurence, MD;  Location: [facility];  Service: Gynecology;  Laterality: N/A;  . Cystocele repair  01/18/2012    Procedure: ANTERIOR REPAIR (CYSTOCELE);  Surgeon: Loney Laurence, MD;  Location: [facility];  Service: Gynecology;  Laterality: N/A;  . Cystoscopy 01/18/2012    Procedure: CYSTOSCOPY;  Surgeon: Loney Laurence, MD;  Location: [facility];  Service: Gynecology;  Laterality: N/A;  . Cataract extraction     both eyes  . Colonoscopy   . Polypectomy     reports that she quit smoking about 12 years ago. She has never used smokeless tobacco. She reports that she drinks alcohol. She reports that she does not use illicit drugs. family history includes Alcohol abuse in her other; Heart disease in her father; Hyperlipidemia in her other; and Hypertension in her other.  There is no history of Colon polyps, and Esophageal cancer, and Stomach cancer, and Rectal cancer, . Allergies  Allergen Reactions  . Codeine Nausea Only   Current Outpatient Prescriptions on File Prior to Visit  Medication Sig Dispense Refill  . aspirin 81 MG tablet Take 81 mg by mouth daily.       . Cholecalciferol (VITAMIN D3) 1000 UNITS CAPS Take 5,000 Units by mouth daily.       Marland Kitchen ibuprofen (ADVIL,MOTRIN) 200 MG tablet Take 400 mg by mouth daily.      . metoprolol succinate (TOPROL-XL) 50 MG 24 hr tablet Take 1 tablet (50 mg total) by mouth daily. Take  with or immediately following a meal.  90 tablet  3  . polyethylene glycol (MIRALAX / GLYCOLAX) packet Take 17 g by mouth daily as needed. For constipation      . rosuvastatin (CRESTOR) 20 MG tablet Take 1 tablet (20 mg total) by mouth daily.  90 tablet  3  . venlafaxine XR (EFFEXOR-XR) 150 MG 24 hr capsule Take 1 capsule (150 mg total) by mouth daily.  90 capsule  3  . Na Sulfate-K Sulfate-Mg Sulf (SUPREP BOWEL PREP) SOLN suprep as ordered, no substitutions  354 mL  0  . triamcinolone cream (KENALOG) 0.1 % Apply topically 2 (two) times daily.  30 g  0  . triamterene-hydrochlorothiazide (MAXZIDE-25) 37.5-25 MG per tablet Take 1 each (1 tablet total) by mouth daily.  90  tablet  3   Review of Systems  Constitutional: Negative for unexpected weight change, or unusual diaphoresis  HENT: Negative for tinnitus.   Eyes: Negative for photophobia and visual disturbance.  Respiratory: Negative for choking and stridor.   Gastrointestinal: Negative for vomiting and blood in stool.  Genitourinary: Negative for hematuria and decreased urine volume.  Musculoskeletal: Negative for acute joint swelling Skin: Negative for color change and wound.  Neurological: Negative for tremors and numbness other than noted  Psychiatric/Behavioral: Negative for decreased concentration or  hyperactivity.       Objective:   Physical Exam BP 120/70  Pulse 66  Temp 97 F (36.1 C) (Oral)  Ht 5\' 3"  (1.6 m)  Wt 212 lb 4 oz (96.276 kg)  BMI 37.60 kg/m2  SpO2 94% VS noted, not ill appearing Constitutional: Pt appears well-developed and well-nourished.  HENT: Head: NCAT.  Right Ear: External ear normal.  Left Ear: External ear normal.  Bilat TM's clear, pharynx benign Eyes: Conjunctivae and EOM are normal except for watery eye d/c and bilat upper and lower eyelid erythema/swelling - nontender. Pupils are equal, round, and reactive to light.  Neck: Normal range of motion. Neck supple.  Cardiovascular: Normal rate and regular rhythm.   Pulmonary/Chest: Effort normal and breath sounds normal.  Abd:  Soft, NT, non-distended, + BS Neurological: Pt is alert. Not confused  Skin: Skin is warm. No erythema.  Psychiatric: Pt behavior is normal. Thought content normal.     Assessment & Plan:

## 2013-01-17 NOTE — Assessment & Plan Note (Signed)
stable overall by history and exam, recent data reviewed with pt, and pt to continue medical treatment as before,  to f/u any worsening symptoms or concerns BP Readings from Last 3 Encounters:  01/17/13 120/70  02/20/12 102/72  01/18/12 121/68

## 2013-01-23 ENCOUNTER — Other Ambulatory Visit: Payer: Self-pay | Admitting: Internal Medicine

## 2013-01-24 ENCOUNTER — Ambulatory Visit (AMBULATORY_SURGERY_CENTER): Payer: Medicare Other | Admitting: Internal Medicine

## 2013-01-24 ENCOUNTER — Encounter: Payer: Self-pay | Admitting: Internal Medicine

## 2013-01-24 VITALS — BP 133/78 | HR 57 | Temp 98.2°F | Resp 25 | Ht 63.0 in | Wt 214.0 lb

## 2013-01-24 DIAGNOSIS — G4733 Obstructive sleep apnea (adult) (pediatric): Secondary | ICD-10-CM | POA: Diagnosis not present

## 2013-01-24 DIAGNOSIS — Z1211 Encounter for screening for malignant neoplasm of colon: Secondary | ICD-10-CM | POA: Diagnosis not present

## 2013-01-24 DIAGNOSIS — D649 Anemia, unspecified: Secondary | ICD-10-CM | POA: Diagnosis not present

## 2013-01-24 DIAGNOSIS — I10 Essential (primary) hypertension: Secondary | ICD-10-CM | POA: Diagnosis not present

## 2013-01-24 DIAGNOSIS — Z8701 Personal history of pneumonia (recurrent): Secondary | ICD-10-CM | POA: Diagnosis not present

## 2013-01-24 DIAGNOSIS — I491 Atrial premature depolarization: Secondary | ICD-10-CM | POA: Diagnosis not present

## 2013-01-24 DIAGNOSIS — E669 Obesity, unspecified: Secondary | ICD-10-CM | POA: Diagnosis not present

## 2013-01-24 MED ORDER — DOCUSATE SODIUM 283 MG RE ENEM: 1.0000 | ENEMA | Freq: Once | RECTAL | Status: DC

## 2013-01-24 MED ORDER — SODIUM CHLORIDE 0.9 % IV SOLN: 500.0000 mL | INTRAVENOUS | Status: DC

## 2013-01-24 NOTE — Op Note (Signed)
[facility] [address] [place], [address]   COLONOSCOPY PROCEDURE REPORT  PATIENT: Elizabeth Buckley, Elizabeth Buckley  MR#: [id number] BIRTHDATE: 01-11-1951 , 61  yrs. old GENDER: Female ENDOSCOPIST: Iva Boop, MD, [facility] PROCEDURE DATE:  01/24/2013 PROCEDURE:   Colonoscopy ASA CLASS:   Class III INDICATIONS:average risk screening. MEDICATIONS: propofol (Diprivan) 300mg  IV, MAC sedation, administered by CRNA, and These medications were titrated to patient response per physician's verbal order  DESCRIPTION OF PROCEDURE:   After the risks benefits and alternatives of the procedure were thoroughly explained, informed consent was obtained.  A digital rectal exam revealed no abnormalities of the rectum.   The LB [id number]  endoscope was introduced through the anus and advanced to the cecum, which was identified by both the appendix and ileocecal valve. No adverse events experienced.   The quality of the prep was Suprep good  The instrument was then slowly withdrawn as the colon was fully examined.      COLON FINDINGS: A normal appearing cecum, ileocecal valve, and appendiceal orifice were identified.  The ascending, hepatic flexure, transverse, splenic flexure, descending, sigmoid colon and rectum appeared unremarkable.  No polyps or cancers were seen.   A right colon retroflexion was performed.  Retroflexed views revealed no abnormalities. The time to cecum=5 minutes 09 seconds. Withdrawal time=10 minutes 29 seconds.  The scope was withdrawn and the procedure completed. COMPLICATIONS: There were no complications.  ENDOSCOPIC IMPRESSION: Normal colonoscopy, good prep. (second screening colonoscopy)  RECOMMENDATIONS: Repeat colonoscopy 10 years.   eSigned:  Iva Boop, MD, [facility] 01/24/2013 10:54 AM   cc: The Patient

## 2013-01-24 NOTE — Patient Instructions (Addendum)
The colonoscopy was normal. No polyps or cancer seen.  Next routine colonoscopy in 2024.  Thank you for choosing me and [facility].  Iva Boop, MD, [facility]  Discharge instructions given with verbal understanding. Normal exam. Resume previous medications. YOU HAD AN ENDOSCOPIC PROCEDURE TODAY AT THE [facility]: Refer to the procedure report that was given to you for any specific questions about what was found during the examination.  If the procedure report does not answer your questions, please call your gastroenterologist to clarify.  If you requested that your care partner not be given the details of your procedure findings, then the procedure report has been included in a sealed envelope for you to review at your convenience later.  YOU SHOULD EXPECT: Some feelings of bloating in the abdomen. Passage of more gas than usual.  Walking can help get rid of the air that was put into your GI tract during the procedure and reduce the bloating. If you had a lower endoscopy (such as a colonoscopy or flexible sigmoidoscopy) you may notice spotting of blood in your stool or on the toilet paper. If you underwent a bowel prep for your procedure, then you may not have a normal bowel movement for a few days.  DIET: Your first meal following the procedure should be a light meal and then it is ok to progress to your normal diet.  A half-sandwich or bowl of soup is an example of a good first meal.  Heavy or fried foods are harder to digest and may make you feel nauseous or bloated.  Likewise meals heavy in dairy and vegetables can cause extra gas to form and this can also increase the bloating.  Drink plenty of fluids but you should avoid alcoholic beverages for 24 hours.  ACTIVITY: Your care partner should take you home directly after the procedure.  You should plan to take it easy, moving slowly for the rest of the day.  You can resume normal activity the day after the procedure  however you should NOT DRIVE or use heavy machinery for 24 hours (because of the sedation medicines used during the test).    SYMPTOMS TO REPORT IMMEDIATELY: A gastroenterologist can be reached at any hour.  During normal business hours, 8:30 AM to 5:00 PM Monday through Friday, call [phone].  After hours and on weekends, please call the GI answering service at [phone] who will take a message and have the physician on call contact you.   Following lower endoscopy (colonoscopy or flexible sigmoidoscopy):  Excessive amounts of blood in the stool  Significant tenderness or worsening of abdominal pains  Swelling of the abdomen that is new, acute  Fever of 100F or higher  FOLLOW UP: If any biopsies were taken you will be contacted by phone or by letter within the next 1-3 weeks.  Call your gastroenterologist if you have not heard about the biopsies in 3 weeks.  Our staff will call the home number listed on your records the next business day following your procedure to check on you and address any questions or concerns that you may have at that time regarding the information given to you following your procedure. This is a courtesy call and so if there is no answer at the home number and we have not heard from you through the emergency physician on call, we will assume that you have returned to your regular daily activities without incident.  SIGNATURES/CONFIDENTIALITY: You and/or your care partner have  signed paperwork which will be entered into your electronic medical record.  These signatures attest to the fact that that the information above on your After Visit Summary has been reviewed and is understood.  Full responsibility of the confidentiality of this discharge information lies with you and/or your care-partner.

## 2013-01-24 NOTE — Progress Notes (Signed)
Patient did not experience any of the following events: a burn prior to discharge; a fall within the facility; wrong site/side/patient/procedure/implant event; or a hospital transfer or hospital admission upon discharge from the facility. (G8907) Patient did not have preoperative order for IV antibiotic SSI prophylaxis. (G8918)  

## 2013-01-25 ENCOUNTER — Telehealth: Payer: Self-pay | Admitting: *Deleted

## 2013-01-25 MED ORDER — TRIAMTERENE-HCTZ 37.5-25 MG PO TABS: 1.0000 | ORAL_TABLET | Freq: Every day | ORAL | Status: DC

## 2013-01-25 NOTE — Telephone Encounter (Signed)
Please direct request to pcp dr Jonny Ruiz, as i no longer see this pt

## 2013-01-25 NOTE — Telephone Encounter (Signed)
PATIENT PHARMACY SENT FAX REQUEST FOR REFILL ON MEDICATION TRIAMTERENE-HCTZ. LAST OV WITH YOU 01/13/2012. PLEASE ADVISE.

## 2013-01-25 NOTE — Telephone Encounter (Signed)
  Follow up Call-  Call back number 01/24/2013  Post procedure Call Back phone  # [phone]  Permission to leave phone message Yes     Patient questions:  Do you have a fever, pain , or abdominal swelling? no Pain Score  0 *  Have you tolerated food without any problems? yes  Have you been able to return to your normal activities? yes  Do you have any questions about your discharge instructions: Diet   no Medications  no Follow up visit  no  Do you have questions or concerns about your Care? no  Actions: * If pain score is 4 or above: No action needed, pain <4.

## 2013-02-11 ENCOUNTER — Other Ambulatory Visit (INDEPENDENT_AMBULATORY_CARE_PROVIDER_SITE_OTHER): Payer: Medicare Other

## 2013-02-11 ENCOUNTER — Encounter: Payer: Medicare Other | Admitting: Internal Medicine

## 2013-02-11 DIAGNOSIS — E785 Hyperlipidemia, unspecified: Secondary | ICD-10-CM

## 2013-02-11 LAB — CBC WITH DIFFERENTIAL/PLATELET
Eosinophils Relative: 3.2 % (ref 0.0–5.0)
Lymphocytes Relative: 36.2 % (ref 12.0–46.0)
MCV: 80.2 fl (ref 78.0–100.0)
Monocytes Absolute: 0.4 10*3/uL (ref 0.1–1.0)
Monocytes Relative: 6.7 % (ref 3.0–12.0)
Neutrophils Relative %: 53.4 % (ref 43.0–77.0)
Platelets: 307 10*3/uL (ref 150.0–400.0)
WBC: 6.2 10*3/uL (ref 4.5–10.5)

## 2013-02-11 LAB — URINALYSIS, ROUTINE W REFLEX MICROSCOPIC
Bilirubin Urine: NEGATIVE
Hgb urine dipstick: NEGATIVE
Nitrite: NEGATIVE
pH: 7 (ref 5.0–8.0)

## 2013-02-11 LAB — HEPATIC FUNCTION PANEL
ALT: 21 U/L (ref 0–35)
AST: 26 U/L (ref 0–37)
Albumin: 3.9 g/dL (ref 3.5–5.2)
Total Protein: 7.5 g/dL (ref 6.0–8.3)

## 2013-02-11 LAB — LIPID PANEL
Cholesterol: 171 mg/dL (ref 0–200)
Triglycerides: 124 mg/dL (ref 0.0–149.0)

## 2013-02-11 LAB — BASIC METABOLIC PANEL
BUN: 11 mg/dL (ref 6–23)
CO2: 27 mEq/L (ref 19–32)
Chloride: 96 mEq/L (ref 96–112)
Creatinine, Ser: 1 mg/dL (ref 0.4–1.2)
Glucose, Bld: 80 mg/dL (ref 70–99)
Potassium: 3.3 mEq/L — ABNORMAL LOW (ref 3.5–5.1)

## 2013-02-11 LAB — TSH: TSH: 6.33 u[IU]/mL — ABNORMAL HIGH (ref 0.35–5.50)

## 2013-02-12 ENCOUNTER — Ambulatory Visit: Payer: Medicare Other

## 2013-02-12 DIAGNOSIS — D518 Other vitamin B12 deficiency anemias: Secondary | ICD-10-CM

## 2013-02-12 LAB — IBC PANEL: Saturation Ratios: 7 % — ABNORMAL LOW (ref 20.0–50.0)

## 2013-02-13 ENCOUNTER — Other Ambulatory Visit: Payer: Self-pay | Admitting: Internal Medicine

## 2013-02-13 DIAGNOSIS — D509 Iron deficiency anemia, unspecified: Secondary | ICD-10-CM

## 2013-03-11 ENCOUNTER — Other Ambulatory Visit: Payer: Self-pay | Admitting: Internal Medicine

## 2013-03-11 ENCOUNTER — Ambulatory Visit (INDEPENDENT_AMBULATORY_CARE_PROVIDER_SITE_OTHER): Payer: Medicare Other | Admitting: Internal Medicine

## 2013-03-11 ENCOUNTER — Encounter: Payer: Self-pay | Admitting: Internal Medicine

## 2013-03-11 VITALS — BP 122/62 | HR 72 | Temp 97.3°F | Ht 63.0 in | Wt 198.0 lb

## 2013-03-11 DIAGNOSIS — J019 Acute sinusitis, unspecified: Secondary | ICD-10-CM | POA: Diagnosis not present

## 2013-03-11 MED ORDER — MECLIZINE HCL 12.5 MG PO TABS: 12.5000 mg | ORAL_TABLET | Freq: Three times a day (TID) | ORAL | Status: DC | PRN

## 2013-03-11 MED ORDER — AMOXICILLIN-POT CLAVULANATE 875-125 MG PO TABS: 1.0000 | ORAL_TABLET | Freq: Two times a day (BID) | ORAL | Status: DC

## 2013-03-11 NOTE — Patient Instructions (Signed)

## 2013-03-12 ENCOUNTER — Encounter: Payer: Self-pay | Admitting: Internal Medicine

## 2013-03-12 NOTE — Progress Notes (Signed)
HPI  Pt presents to the clinic today with 3 week history of fatigue, fevers, nasal congestion and headache. She has tried all the home remedies she can think of but the symptoms are getting worse. At this point, she has absolutely no energy. She has also tried Mucinex, Sudafed and cough syrup. She is not sleeping well. She has no history of allergies or asthma. She has not had sick contacts.  Review of Systems    Past Medical History  Diagnosis Date  . ANXIETY   . BACK PAIN   . CONSTIPATION, CHRONIC   . DEPRESSION   . HYPERLIPIDEMIA   . HYPERTENSION   . NASH (NON-ALCOHOLIC STEATOHEPATITIS)   . Obesity, unspecified   . OSTEOPENIA   . Cataract   . Anemia   . GERD (gastroesophageal reflux disease)   . Pneumonia     x3    Family History  Problem Relation Age of Onset  . Heart disease Father   . Alcohol abuse Other   . Hyperlipidemia Other   . Hypertension Other   . Colon polyps Neg Hx   . Esophageal cancer Neg Hx   . Stomach cancer Neg Hx   . Rectal cancer Neg Hx     History   Social History  . Marital Status: Married    Spouse Name: N/A    Number of Children: 1  . Years of Education: N/A   Occupational History  . disabled packager    Social History Main Topics  . Smoking status: Former Smoker    Quit date: 01/08/2001  . Smokeless tobacco: Never Used     Comment: stopped 8 years ago  . Alcohol Use: Yes     Comment: occasion 3-4 x yr  . Drug Use: No  . Sexually Active: Not on file   Other Topics Concern  . Not on file   Social History Narrative   Daily caffeine, 2 cups daily   Patient does not get regular exercise    Allergies  Allergen Reactions  . Codeine Nausea Only     Constitutional: Positive headache, fatigue and fever. Denies abrupt weight changes.  HEENT:  Positive eye pain, pressure behind the eyes, facial pain, nasal congestion and sore throat. Denies eye redness, ear pain, ringing in the ears, wax buildup, runny nose or bloody  nose. Respiratory: Positive cough. Denies difficulty breathing or shortness of breath.  Cardiovascular: Denies chest pain, chest tightness, palpitations or swelling in the hands or feet.   No other specific complaints in a complete review of systems (except as listed in HPI above).  Objective:    BP 122/62  Pulse 72  Temp(Src) 97.3 F (36.3 C) (Oral)  Ht 5\' 3"  (1.6 m)  Wt 198 lb (89.812 kg)  BMI 35.08 kg/m2  SpO2 92% Wt Readings from Last 3 Encounters:  03/11/13 198 lb (89.812 kg)  01/24/13 214 lb (97.07 kg)  01/17/13 212 lb 4 oz (96.276 kg)    General: Appears her stated age, well developed, well nourished in NAD. HEENT: Head: normal shape and size; Eyes: sclera white, no icterus, conjunctiva pink, PERRLA and EOMs intact; Ears: Tm's gray and intact, normal light reflex; Nose: mucosa pink and moist, septum midline; Throat/Mouth: + PND. Teeth present, mucosa pink and moist, no exudate noted, no lesions or ulcerations noted.  Neck: Mild cervical lymphadenopathy. Neck supple, trachea midline. No massses, lumps or thyromegaly present.  Cardiovascular: Normal rate and rhythm. S1,S2 noted.  No murmur, rubs or gallops noted. No JVD  or BLE edema. No carotid bruits noted. Pulmonary/Chest: Normal effort and positive vesicular breath sounds. No respiratory distress. No wheezes, rales or ronchi noted.      Assessment & Plan:   Acute bacterial sinusitis  Can use a Neti Pot which can be purchased from your local drug store. Flonase 2 sprays each nostril for 3 days and then as needed. Augmentin BID for 10 days Antivert for dizziness associated with inner ear dysfunction  RTC as needed or if symptoms persist.

## 2013-03-19 ENCOUNTER — Encounter: Payer: Self-pay | Admitting: Internal Medicine

## 2013-03-19 ENCOUNTER — Ambulatory Visit (INDEPENDENT_AMBULATORY_CARE_PROVIDER_SITE_OTHER): Payer: Medicare Other | Admitting: Internal Medicine

## 2013-03-19 VITALS — BP 122/78 | HR 81 | Ht 63.0 in | Wt 191.0 lb

## 2013-03-19 DIAGNOSIS — Z52008 Unspecified donor, other blood: Secondary | ICD-10-CM | POA: Diagnosis not present

## 2013-03-19 DIAGNOSIS — D500 Iron deficiency anemia secondary to blood loss (chronic): Secondary | ICD-10-CM

## 2013-03-19 NOTE — Patient Instructions (Addendum)
Avoid blood donations until cleared by your Dr.  Harolyn Rutherford on your iron, follow up with Dr. Oliver Barre regarding labs for this.  Congratulations on your weight loss, keep up the good work.  Follow up with [place] as needed.  Thank you for choosing me and [facility].  Iva Boop, M.D., [facility]

## 2013-03-19 NOTE — Progress Notes (Signed)
  Subjective:    Patient ID: Elizabeth Buckley, female    DOB: 1951/07/29, 62 y.o.   MRN: [id number]  HPI Here for new/recurrent iron-deficiency anemia Just had a negative screening colonoscopy 01/22/13. She volunteers that she is a regular blood donor, every other month for 15 years but less lately. Has been turned down due to low iron in the past. No upper GI or other GI sxs. No bleeding. Stools dark since starting ferrous sulfate.   Review of Systems Losing weight with weight Watchers Recent sinus infection with fatigue also - on Tx    Objective:   Physical Exam NAD  Data: Iron sat 8% with increased TIBC March 2014    Assessment & Plan:  Iron deficiency anemia secondary to blood loss (chronic)- from blood donation  Blood donor  1. Stay on ferrous sulfate 2. Stop donating blood until iron stores replete- is to follow-up with Dr. Jonny Ruiz later this year 3. She is appreciative of Dr. Raphael Gibney attention to detail and concern - I agree it was best to check this out.   WU:[id number] Jonny Ruiz, MD

## 2013-07-18 ENCOUNTER — Other Ambulatory Visit: Payer: Self-pay | Admitting: Internal Medicine

## 2013-07-18 ENCOUNTER — Other Ambulatory Visit (INDEPENDENT_AMBULATORY_CARE_PROVIDER_SITE_OTHER): Payer: Medicare Other

## 2013-07-18 ENCOUNTER — Encounter: Payer: Self-pay | Admitting: Internal Medicine

## 2013-07-18 ENCOUNTER — Ambulatory Visit (INDEPENDENT_AMBULATORY_CARE_PROVIDER_SITE_OTHER): Payer: Medicare Other | Admitting: Internal Medicine

## 2013-07-18 VITALS — BP 130/80 | HR 65 | Temp 97.5°F | Ht 63.0 in | Wt 201.2 lb

## 2013-07-18 DIAGNOSIS — F3289 Other specified depressive episodes: Secondary | ICD-10-CM | POA: Diagnosis not present

## 2013-07-18 DIAGNOSIS — D500 Iron deficiency anemia secondary to blood loss (chronic): Secondary | ICD-10-CM

## 2013-07-18 DIAGNOSIS — R413 Other amnesia: Secondary | ICD-10-CM | POA: Insufficient documentation

## 2013-07-18 DIAGNOSIS — R6889 Other general symptoms and signs: Secondary | ICD-10-CM

## 2013-07-18 DIAGNOSIS — F329 Major depressive disorder, single episode, unspecified: Secondary | ICD-10-CM

## 2013-07-18 DIAGNOSIS — E039 Hypothyroidism, unspecified: Secondary | ICD-10-CM | POA: Diagnosis not present

## 2013-07-18 LAB — CBC WITH DIFFERENTIAL/PLATELET
Basophils Absolute: 0 10*3/uL (ref 0.0–0.1)
Basophils Relative: 0.5 % (ref 0.0–3.0)
Eosinophils Absolute: 0.4 10*3/uL (ref 0.0–0.7)
Hemoglobin: 12.8 g/dL (ref 12.0–15.0)
Lymphocytes Relative: 40.9 % (ref 12.0–46.0)
MCHC: 33.2 g/dL (ref 30.0–36.0)
MCV: 85.1 fl (ref 78.0–100.0)
Monocytes Absolute: 0.5 10*3/uL (ref 0.1–1.0)
Neutro Abs: 3.8 10*3/uL (ref 1.4–7.7)
RBC: 4.53 Mil/uL (ref 3.87–5.11)
RDW: 15.1 % — ABNORMAL HIGH (ref 11.5–14.6)

## 2013-07-18 LAB — HEPATIC FUNCTION PANEL
ALT: 17 U/L (ref 0–35)
AST: 24 U/L (ref 0–37)
Albumin: 3.8 g/dL (ref 3.5–5.2)
Alkaline Phosphatase: 101 U/L (ref 39–117)
Total Protein: 7.9 g/dL (ref 6.0–8.3)

## 2013-07-18 LAB — BASIC METABOLIC PANEL
BUN: 13 mg/dL (ref 6–23)
Chloride: 98 mEq/L (ref 96–112)
Creatinine, Ser: 1 mg/dL (ref 0.4–1.2)
Glucose, Bld: 73 mg/dL (ref 70–99)

## 2013-07-18 MED ORDER — VENLAFAXINE HCL ER 150 MG PO CP24: 150.0000 mg | ORAL_CAPSULE | Freq: Every day | ORAL | Status: DC

## 2013-07-18 MED ORDER — METOPROLOL SUCCINATE ER 50 MG PO TB24: 50.0000 mg | ORAL_TABLET | Freq: Every day | ORAL | Status: DC

## 2013-07-18 MED ORDER — ROSUVASTATIN CALCIUM 20 MG PO TABS: 20.0000 mg | ORAL_TABLET | Freq: Every day | ORAL | Status: DC

## 2013-07-18 MED ORDER — POTASSIUM CHLORIDE ER 10 MEQ PO TBCR: 10.0000 meq | EXTENDED_RELEASE_TABLET | Freq: Every day | ORAL | Status: DC

## 2013-07-18 MED ORDER — TRIAMTERENE-HCTZ 37.5-25 MG PO TABS: 1.0000 | ORAL_TABLET | Freq: Every day | ORAL | Status: DC

## 2013-07-18 NOTE — Patient Instructions (Signed)
Please try benadryl OTC for the arm itching and rash Please continue all other medications as before, and refills have been done if requested. Please go to the LAB in the Basement (turn left off the elevator) for the tests to be done today You will be contacted by phone if any changes need to be made immediately.  Otherwise, you will receive a letter about your results with an explanation, but please check with MyChart first.  Please remember to sign up for My Chart if you have not done so, as this will be important to you in the future with finding out test results, communicating by private email, and scheduling acute appointments online when needed.  Please return in 6 months, or sooner if needed

## 2013-07-18 NOTE — Progress Notes (Signed)
Subjective:    Patient ID: Elizabeth Buckley, female    DOB: 06/14/1951, 62 y.o.   MRN: [id number]  HPI  Here to f/u, has occasional forgetfulness but always remembers a short time later.  No overt bleeding or bruising.  Denies worsening depressive symptoms, suicidal ideation, or panic; has ongoing anxiety, not increased recently.   Also with ongoing itch and several sores to arms as she cant stop scratching for over a yr. Pt denies chest pain, increased sob or doe, wheezing, orthopnea, PND, increased LE swelling, palpitations, dizziness or syncope.  Pt denies new neurological symptoms such as new headache, or facial or extremity weakness or numbness  Denies hyper or hypo thyroid symptoms such as voice, skin or hair change.  Past Medical History  Diagnosis Date  . ANXIETY   . BACK PAIN   . CONSTIPATION, CHRONIC   . DEPRESSION   . HYPERLIPIDEMIA   . HYPERTENSION   . NASH (NON-ALCOHOLIC STEATOHEPATITIS)   . Obesity, unspecified   . OSTEOPENIA   . Cataract   . Anemia   . GERD (gastroesophageal reflux disease)   . Pneumonia     x3   Past Surgical History  Procedure Laterality Date  . Electrocardiogram  07/27/2006  . Appendectomy    . Cholecystectomy    . Tubal ligation    . Trigger finger repair left thumb    . Cataract surgery      left and right eye  . S/p lumbar surgery    . S/p liver biopsy      Dr. Leone Payor  . Upper gastrointestinal endoscopy  01/03/2003  . Colonoscopy w/ biopsies and polypectomy  01/03/2003    submucosal lesion  . Abdominal hysterectomy    . Bladder suspension  01/18/2012    Procedure: TRANSVAGINAL TAPE (TVT) PROCEDURE;  Surgeon: Loney Laurence, MD;  Location: [facility];  Service: Gynecology;  Laterality: N/A;  . Cystocele repair  01/18/2012    Procedure: ANTERIOR REPAIR (CYSTOCELE);  Surgeon: Loney Laurence, MD;  Location: [facility];  Service: Gynecology;  Laterality: N/A;  . Cystoscopy  01/18/2012    Procedure: CYSTOSCOPY;  Surgeon: Loney Laurence, MD;   Location: [facility];  Service: Gynecology;  Laterality: N/A;  . Cataract extraction      both eyes    reports that she quit smoking about 12 years ago. She has never used smokeless tobacco. She reports that  drinks alcohol. She reports that she does not use illicit drugs. family history includes Alcohol abuse in her other; Heart disease in her father; Hyperlipidemia in her other; and Hypertension in her other.  There is no history of Colon polyps, and Esophageal cancer, and Stomach cancer, and Rectal cancer, . Allergies  Allergen Reactions  . Codeine Nausea Only   Current Outpatient Prescriptions on File Prior to Visit  Medication Sig Dispense Refill  . aspirin 81 MG tablet Take 81 mg by mouth daily.       . Cholecalciferol (VITAMIN D3) 1000 UNITS CAPS Take 5,000 Units by mouth daily.       Marland Kitchen ibuprofen (ADVIL,MOTRIN) 200 MG tablet Take 400 mg by mouth daily.      . meclizine (ANTIVERT) 12.5 MG tablet Take 1 tablet (12.5 mg total) by mouth 3 (three) times daily as needed.  30 tablet  0  . polyethylene glycol (MIRALAX / GLYCOLAX) packet Take 17 g by mouth daily as needed. For constipation       No current facility-administered medications on file prior  to visit.   Review of Systems  Constitutional: Negative for unexpected weight change, or unusual diaphoresis  HENT: Negative for tinnitus.   Eyes: Negative for photophobia and visual disturbance.  Respiratory: Negative for choking and stridor.   Gastrointestinal: Negative for vomiting and blood in stool.  Genitourinary: Negative for hematuria and decreased urine volume.  Musculoskeletal: Negative for acute joint swelling Skin: Negative for color change and wound.  Neurological: Negative for tremors and numbness other than noted  Psychiatric/Behavioral: Negative for decreased concentration or  hyperactivity.       Objective:   Physical Exam BP 130/80  Pulse 65  Temp(Src) 97.5 F (36.4 C) (Oral)  Ht 5\' 3"  (1.6 m)  Wt 201 lb 4 oz  (91.286 kg)  BMI 35.66 kg/m2  SpO2 95% VS noted,  Constitutional: Pt appears well-developed and well-nourished.  HENT: Head: NCAT.  Right Ear: External ear normal.  Left Ear: External ear normal.  Eyes: Conjunctivae and EOM are normal. Pupils are equal, round, and reactive to light.  Neck: Normal range of motion. Neck supple.  Cardiovascular: Normal rate and regular rhythm.   Pulmonary/Chest: Effort normal and breath sounds normal.  Abd:  Soft, NT, non-distended, + BS Neurological: Pt is alert. Not confused  Skin: Skin is warm. No erythema.  Psychiatric: Pt behavior is normal. Thought content normal.  Mini mental state: normal exam    Assessment & Plan:

## 2013-07-21 NOTE — Assessment & Plan Note (Signed)
stable overall by history and exam, recent data reviewed with pt, and pt to continue medical treatment as before,  to f/u any worsening symptoms or concerns Lab Results  Component Value Date   TSH 6.33* 02/11/2013    

## 2013-07-21 NOTE — Assessment & Plan Note (Signed)
stable overall by history and exam, recent data reviewed with pt, and pt to continue medical treatment as before,  to f/u any worsening symptoms or concerns Lab Results  Component Value Date   WBC 7.9 07/18/2013   HGB 12.8 07/18/2013   HCT 38.5 07/18/2013   PLT 271.0 07/18/2013   GLUCOSE 73 07/18/2013   CHOL 171 02/11/2013   TRIG 124.0 02/11/2013   HDL 72.10 02/11/2013   LDLDIRECT 134.2 11/03/2011   LDLCALC 74 02/11/2013   ALT 17 07/18/2013   AST 24 07/18/2013   NA 135 07/18/2013   K 3.3* 07/18/2013   CL 98 07/18/2013   CREATININE 1.0 07/18/2013   BUN 13 07/18/2013   CO2 32 07/18/2013   TSH 6.33* 02/11/2013   INR 1.0 11/10/2008    

## 2013-07-21 NOTE — Assessment & Plan Note (Signed)
D/w pt, benign, ok to hold on further eval at this time,  to f/u any worsening symptoms or concerns

## 2013-07-21 NOTE — Assessment & Plan Note (Signed)
For f/u labs today, stable overall by history and exam, recent data reviewed with pt, and pt to continue medical treatment as before,  to f/u any worsening symptoms or concerns Lab Results  Component Value Date   WBC 7.9 07/18/2013   HGB 12.8 07/18/2013   HCT 38.5 07/18/2013   MCV 85.1 07/18/2013   PLT 271.0 07/18/2013

## 2013-09-04 ENCOUNTER — Other Ambulatory Visit: Payer: Self-pay

## 2013-09-04 DIAGNOSIS — Z1231 Encounter for screening mammogram for malignant neoplasm of breast: Secondary | ICD-10-CM

## 2013-10-08 ENCOUNTER — Ambulatory Visit: Payer: Medicare Other

## 2013-10-24 ENCOUNTER — Other Ambulatory Visit: Payer: Self-pay

## 2013-11-05 ENCOUNTER — Ambulatory Visit
Admission: RE | Admit: 2013-11-05 | Discharge: 2013-11-05 | Disposition: A | Discharge status: Home / Self Care | Payer: Medicare Other | Source: Ambulatory Visit

## 2013-11-05 DIAGNOSIS — Z1231 Encounter for screening mammogram for malignant neoplasm of breast: Secondary | ICD-10-CM

## 2013-11-06 LAB — HM MAMMOGRAPHY

## 2013-12-05 DIAGNOSIS — H40059 Ocular hypertension, unspecified eye: Secondary | ICD-10-CM | POA: Diagnosis not present

## 2014-01-17 ENCOUNTER — Ambulatory Visit: Payer: Medicare Other | Admitting: Internal Medicine

## 2014-01-22 ENCOUNTER — Encounter: Payer: Self-pay | Admitting: Internal Medicine

## 2014-01-22 ENCOUNTER — Ambulatory Visit (INDEPENDENT_AMBULATORY_CARE_PROVIDER_SITE_OTHER): Payer: Medicare Other | Admitting: Internal Medicine

## 2014-01-22 VITALS — BP 126/82 | HR 94 | Temp 97.6°F | Ht 63.0 in | Wt 213.1 lb

## 2014-01-22 DIAGNOSIS — Z23 Encounter for immunization: Secondary | ICD-10-CM

## 2014-01-22 DIAGNOSIS — M545 Low back pain, unspecified: Secondary | ICD-10-CM | POA: Diagnosis not present

## 2014-01-22 DIAGNOSIS — E785 Hyperlipidemia, unspecified: Secondary | ICD-10-CM

## 2014-01-22 DIAGNOSIS — I10 Essential (primary) hypertension: Secondary | ICD-10-CM

## 2014-01-22 DIAGNOSIS — J019 Acute sinusitis, unspecified: Secondary | ICD-10-CM | POA: Diagnosis not present

## 2014-01-22 MED ORDER — MECLIZINE HCL 12.5 MG PO TABS: 12.5000 mg | ORAL_TABLET | Freq: Three times a day (TID) | ORAL | Status: DC | PRN

## 2014-01-22 NOTE — Progress Notes (Signed)
Pre-visit discussion using our clinic review tool. No additional management support is needed unless otherwise documented below in the visit note.  

## 2014-01-22 NOTE — Addendum Note (Signed)
Addended by: Sharon Seller B on: 01/22/2014 05:29 PM   Modules accepted: Orders

## 2014-01-22 NOTE — Assessment & Plan Note (Signed)
Ok for sport med referral  - has aptp 345 pm with Dr Tamala Julian in our office

## 2014-01-22 NOTE — Patient Instructions (Signed)
You had the flu shot today Please continue all other medications as before, and refills have been done if requested. Please have the pharmacy call with any other refills you may need. Please continue your efforts at being more active, low cholesterol diet, and weight control. You are otherwise up to date with prevention measures today, and we can consider the new pneumonia shot next visit No further blood work is needed for now  You have an appt with Dr Smith/sports medicine on Friday at 87PM for the lower back  Please return in 6 months, or sooner if needed

## 2014-01-22 NOTE — Assessment & Plan Note (Signed)
stable overall by history and exam, recent data reviewed with pt, and pt to continue medical treatment as before,  to f/u any worsening symptoms or concerns BP Readings from Last 3 Encounters:  01/22/14 126/82  07/18/13 130/80  03/19/13 122/78

## 2014-01-22 NOTE — Assessment & Plan Note (Signed)
stable overall by history and exam, recent data reviewed with pt, and pt to continue medical treatment as before,  to f/u any worsening symptoms or concerns Lab Results  Component Value Date   LDLCALC 74 02/11/2013    

## 2014-01-22 NOTE — Progress Notes (Signed)
Subjective:    Patient ID: Elizabeth Buckley, female    DOB: December 03, 1951, 63 y.o.   MRN: [id number]  HPI  Here to f/u; overall doing ok,  Pt denies chest pain, increased sob or doe, wheezing, orthopnea, PND, increased LE swelling, palpitations, dizziness or syncope.  Pt denies polydipsia, polyuria, or low sugar symptoms such as weakness or confusion improved with po intake.  Pt denies new neurological symptoms such as new headache, or facial or extremity weakness or numbness.   Pt states overall good compliance with meds, has been trying to follow lower cholesterol diet, with wt overall stable,  but little exercise however, due to ongoing chronic LBP. Pt continues to have recurring LBP without change in severity, bowel or bladder change, fever, wt loss,  worsening LE pain/numbness/weakness, gait change or falls.  Last seen per ortho approx 7 yr ago, would like MRI but has not neuro changes/complaints at this time. Mother died 2 wks ago, with hospice, dementia. Past Medical History  Diagnosis Date  . ANXIETY   . BACK PAIN   . CONSTIPATION, CHRONIC   . DEPRESSION   . HYPERLIPIDEMIA   . HYPERTENSION   . NASH (NON-ALCOHOLIC STEATOHEPATITIS)   . Obesity, unspecified   . OSTEOPENIA   . Cataract   . Anemia   . GERD (gastroesophageal reflux disease)   . Pneumonia     x3   Past Surgical History  Procedure Laterality Date  . Electrocardiogram  07/27/2006  . Appendectomy    . Cholecystectomy    . Tubal ligation    . Trigger finger repair left thumb    . Cataract surgery      left and right eye  . S/p lumbar surgery    . S/p liver biopsy      Dr. Carlean Purl  . Upper gastrointestinal endoscopy  01/03/2003  . Colonoscopy w/ biopsies and polypectomy  01/03/2003    submucosal lesion  . Abdominal hysterectomy    . Bladder suspension  01/18/2012    Procedure: TRANSVAGINAL TAPE (TVT) PROCEDURE;  Surgeon: Daria Pastures, MD;  Location: [facility];  Service: Gynecology;  Laterality: N/A;  . Cystocele  repair  01/18/2012    Procedure: ANTERIOR REPAIR (CYSTOCELE);  Surgeon: Daria Pastures, MD;  Location: [facility];  Service: Gynecology;  Laterality: N/A;  . Cystoscopy  01/18/2012    Procedure: CYSTOSCOPY;  Surgeon: Daria Pastures, MD;  Location: [facility];  Service: Gynecology;  Laterality: N/A;  . Cataract extraction      both eyes    reports that she quit smoking about 13 years ago. She has never used smokeless tobacco. She reports that she drinks alcohol. She reports that she does not use illicit drugs. family history includes Alcohol abuse in her other; Heart disease in her father; Hyperlipidemia in her other; Hypertension in her other. There is no history of Colon polyps, Esophageal cancer, Stomach cancer, or Rectal cancer. Allergies  Allergen Reactions  . Codeine Nausea Only   Current Outpatient Prescriptions on File Prior to Visit  Medication Sig Dispense Refill  . aspirin 81 MG tablet Take 81 mg by mouth daily.       . Cholecalciferol (VITAMIN D3) 1000 UNITS CAPS Take 5,000 Units by mouth daily.       Marland Kitchen ibuprofen (ADVIL,MOTRIN) 200 MG tablet Take 400 mg by mouth daily.      . metoprolol succinate (TOPROL-XL) 50 MG 24 hr tablet Take 1 tablet (50 mg total) by mouth daily. Take with  or immediately following a meal.  30 tablet  11  . polyethylene glycol (MIRALAX / GLYCOLAX) packet Take 17 g by mouth daily as needed. For constipation      . potassium chloride (KLOR-CON 10) 10 MEQ tablet Take 1 tablet (10 mEq total) by mouth daily.  90 tablet  3  . rosuvastatin (CRESTOR) 20 MG tablet Take 1 tablet (20 mg total) by mouth daily.  30 tablet  11  . triamterene-hydrochlorothiazide (MAXZIDE-25) 37.5-25 MG per tablet Take 1 tablet by mouth daily.  30 tablet  11  . venlafaxine XR (EFFEXOR-XR) 150 MG 24 hr capsule Take 1 capsule (150 mg total) by mouth daily.  30 capsule  11   No current facility-administered medications on file prior to visit.    Review of Systems  Constitutional: Negative  for unexpected weight change, or unusual diaphoresis  HENT: Negative for tinnitus.   Eyes: Negative for photophobia and visual disturbance.  Respiratory: Negative for choking and stridor.   Gastrointestinal: Negative for vomiting and blood in stool.  Genitourinary: Negative for hematuria and decreased urine volume.  Musculoskeletal: Negative for acute joint swelling Skin: Negative for color change and wound.  Neurological: Negative for tremors and numbness other than noted  Psychiatric/Behavioral: Negative for decreased concentration or  hyperactivity.       Objective:   Physical Exam BP 126/82  Pulse 94  Temp(Src) 97.6 F (36.4 C) (Oral)  Ht 5\' 3"  (1.6 m)  Wt 213 lb 2 oz (96.673 kg)  BMI 37.76 kg/m2  SpO2 97% VS noted,  Constitutional: Pt appears well-developed and well-nourished.  HENT: Head: NCAT.  Right Ear: External ear normal.  Left Ear: External ear normal.  Eyes: Conjunctivae and EOM are normal. Pupils are equal, round, and reactive to light.  Neck: Normal range of motion. Neck supple.  Cardiovascular: Normal rate and regular rhythm.   Pulmonary/Chest: Effort normal and breath sounds normal.  Abd:  Soft, NT, non-distended, + BS Neurological: Pt is alert. Not confused , motor grossly intact, no spine tender Skin: Skin is warm. No erythema.  Psychiatric: Pt behavior is normal. Thought content normal. 1+ nervous    Assessment & Plan:

## 2014-01-24 ENCOUNTER — Ambulatory Visit (INDEPENDENT_AMBULATORY_CARE_PROVIDER_SITE_OTHER)
Admission: RE | Admit: 2014-01-24 | Discharge: 2014-01-24 | Disposition: A | Discharge status: Home / Self Care | Payer: Medicare Other | Source: Ambulatory Visit | Attending: Family Medicine | Admitting: Family Medicine

## 2014-01-24 ENCOUNTER — Ambulatory Visit (INDEPENDENT_AMBULATORY_CARE_PROVIDER_SITE_OTHER): Payer: Medicare Other | Admitting: Family Medicine

## 2014-01-24 ENCOUNTER — Encounter: Payer: Self-pay | Admitting: Family Medicine

## 2014-01-24 VITALS — BP 120/90 | HR 87 | Temp 97.0°F | Wt 212.0 lb

## 2014-01-24 DIAGNOSIS — M545 Low back pain, unspecified: Secondary | ICD-10-CM

## 2014-01-24 DIAGNOSIS — M47817 Spondylosis without myelopathy or radiculopathy, lumbosacral region: Secondary | ICD-10-CM | POA: Diagnosis not present

## 2014-01-24 NOTE — Assessment & Plan Note (Signed)
Patient does have low back pain and has had a history of the laminectomies greater than 7 years ago. There would be a concern for potential adjacent segment disease. Would like to get x-rays to make sure that this is not the possibility. Likely more commonly she is going to be secondary to muscle imbalances and early osteoarthritic changes secondary to postsurgical changes. Patient's was given different treatment options and has decided on a conservative approach. We'll do over-the-counter medicines, home exercises, and discussed icing protocol. Patient and will come back again in 4 weeks for further evaluation. X-rays are still pending.

## 2014-01-24 NOTE — Patient Instructions (Signed)
Very nice to meet you We will get xrays downstairs.  For your back try exercises most days of the week.  Take tylenol 650 mg three times a day is the best evidence based medicine we have for arthritis.  Glucosamine sulfate 750mg  twice a day is a supplement that has been shown to help moderate to severe arthritis. Vitamin D 2000 IU daily Fish oil 2 grams daily.  Tumeric 500mg  twice daily.  Capsaicin topically up to four times a day may also help with pain. It's important that you continue to stay active. Controlling your weight is important.  Consider physical therapy to strengthen muscles around the joint that hurts to take pressure off of the joint itself. Shoe inserts with good arch support may be helpful.  [employer] [facility] at [employer] [facility] could help.  Water aerobics and cycling with low resistance are the best two types of exercise for arthritis. Come back and see me in 4 weeks.

## 2014-01-24 NOTE — Progress Notes (Signed)
Pre visit review using our clinic review tool, if applicable. No additional management support is needed unless otherwise documented below in the visit note. 

## 2014-01-24 NOTE — Progress Notes (Signed)
  [facility] [address] [place], [place] [address] Phone: [phone] Subjective:    I'm seeing this patient by the request  of:  Cathlean Cower, MD   CC: Low back pain  LEX:[id number] Elizabeth Buckley is a 63 y.o. female coming in with complaint of low back pain. Patient does have a past medical history and 2007 of having a multilevel laminectomy. Patient states that she did improve somewhat from that surgery but unfortunately never became very active. Patient states any tension become somewhat active she starts having more back pain. This back pain seems to be generalize without any significant focal findings. Patient denies any radiation down her legs or any weakness like she had prior to the surgery. Patient has not tried anything other than narcotics which she would like to not take on a regular basis. Patient has done one round of physical therapy multiple years ago. Patient's goal is to be able to start hiking again. Patient states that the severity of her pain that she called and more of a dull aching sensation is 7/10 in severity.     Past medical history, social, surgical and family history all reviewed in electronic medical record.   Review of Systems: No headache, visual changes, nausea, vomiting, diarrhea, constipation, dizziness, abdominal pain, skin rash, fevers, chills, night sweats, weight loss, swollen lymph nodes, body aches, joint swelling, muscle aches, chest pain, shortness of breath, mood changes.   Objective Blood pressure 120/90, pulse 87, temperature 97 F (36.1 C), temperature source Oral, weight 212 lb (96.163 kg), SpO2 98.00%.  General: No apparent distress alert and oriented x3 mood and affect normal, dressed appropriately. Overweight HEENT: Pupils equal, extraocular movements intact  Respiratory: Patient's speak in full sentences and does not appear short of breath  Cardiovascular: No lower extremity edema, non tender, no erythema    Skin: Warm dry intact with no signs of infection or rash on extremities or on axial skeleton.  Abdomen: Soft nontender  Neuro: Cranial nerves II through XII are intact, neurovascularly intact in all extremities with 2+ DTRs and 2+ pulses.  Lymph: No lymphadenopathy of posterior or anterior cervical chain or axillae bilaterally.  Gait normal with good balance and coordination.  MSK: Non tender with full range of motion and good stability and symmetric strength and tone of shoulders, elbows, wrist, knee and ankles bilaterally.  Back Exam:  Inspection: Incision well healed Motion: Flexion 45 deg, Extension 35 deg, Side Bending to 45 deg bilaterally,  Rotation to 45 deg bilaterally  SLR laying: Negative  XSLR laying: Negative  Palpable tenderness: Mild tenderness at the paraspinal musculature of the lumbar spine left greater than right. FABER: negative. Sensory change: Gross sensation intact to all lumbar and sacral dermatomes.  Reflexes: 2+ at both patellar tendons, 2+ at achilles tendons, Babinski's downgoing.  Strength at foot  Plantar-flexion: 5/5 Dorsi-flexion: 5/5 Eversion: 5/5 Inversion: 5/5  Leg strength  Quad: 5/5 Hamstring: 5/5 Hip flexor: 5/5 Hip abductors: 5/5  Gait unremarkable.      Impression and Recommendations:     This case required medical decision making of moderate complexity.

## 2014-02-26 ENCOUNTER — Ambulatory Visit: Payer: Medicare Other | Admitting: Family Medicine

## 2014-07-21 ENCOUNTER — Other Ambulatory Visit: Payer: Self-pay | Admitting: Internal Medicine

## 2014-08-07 ENCOUNTER — Ambulatory Visit (INDEPENDENT_AMBULATORY_CARE_PROVIDER_SITE_OTHER): Payer: Medicare Other | Admitting: Internal Medicine

## 2014-08-07 ENCOUNTER — Encounter: Payer: Self-pay | Admitting: Internal Medicine

## 2014-08-07 ENCOUNTER — Other Ambulatory Visit: Payer: Self-pay | Admitting: Internal Medicine

## 2014-08-07 ENCOUNTER — Other Ambulatory Visit (INDEPENDENT_AMBULATORY_CARE_PROVIDER_SITE_OTHER): Payer: Medicare Other

## 2014-08-07 ENCOUNTER — Telehealth: Payer: Self-pay | Admitting: Internal Medicine

## 2014-08-07 VITALS — BP 104/72 | HR 81 | Temp 98.2°F | Wt 216.5 lb

## 2014-08-07 DIAGNOSIS — F3289 Other specified depressive episodes: Secondary | ICD-10-CM | POA: Diagnosis not present

## 2014-08-07 DIAGNOSIS — Z23 Encounter for immunization: Secondary | ICD-10-CM

## 2014-08-07 DIAGNOSIS — F329 Major depressive disorder, single episode, unspecified: Secondary | ICD-10-CM

## 2014-08-07 DIAGNOSIS — E785 Hyperlipidemia, unspecified: Secondary | ICD-10-CM

## 2014-08-07 DIAGNOSIS — I10 Essential (primary) hypertension: Secondary | ICD-10-CM | POA: Diagnosis not present

## 2014-08-07 DIAGNOSIS — E039 Hypothyroidism, unspecified: Secondary | ICD-10-CM | POA: Diagnosis not present

## 2014-08-07 LAB — CBC WITH DIFFERENTIAL/PLATELET
BASOS PCT: 0.8 % (ref 0.0–3.0)
Basophils Absolute: 0.1 10*3/uL (ref 0.0–0.1)
EOS ABS: 0.4 10*3/uL (ref 0.0–0.7)
EOS PCT: 5.8 % — AB (ref 0.0–5.0)
HCT: 42.9 % (ref 36.0–46.0)
HEMOGLOBIN: 14.3 g/dL (ref 12.0–15.0)
Lymphocytes Relative: 48.3 % — ABNORMAL HIGH (ref 12.0–46.0)
Lymphs Abs: 3.1 10*3/uL (ref 0.7–4.0)
MCHC: 33.3 g/dL (ref 30.0–36.0)
MCV: 96 fl (ref 78.0–100.0)
Monocytes Absolute: 0.5 10*3/uL (ref 0.1–1.0)
Monocytes Relative: 7.7 % (ref 3.0–12.0)
NEUTROS ABS: 2.4 10*3/uL (ref 1.4–7.7)
Neutrophils Relative %: 37.4 % — ABNORMAL LOW (ref 43.0–77.0)
Platelets: 252 10*3/uL (ref 150.0–400.0)
RBC: 4.47 Mil/uL (ref 3.87–5.11)
RDW: 16 % — ABNORMAL HIGH (ref 11.5–15.5)
WBC: 6.4 10*3/uL (ref 4.0–10.5)

## 2014-08-07 LAB — BASIC METABOLIC PANEL
BUN: 18 mg/dL (ref 6–23)
CO2: 29 meq/L (ref 19–32)
CREATININE: 0.9 mg/dL (ref 0.4–1.2)
Calcium: 9.7 mg/dL (ref 8.4–10.5)
Chloride: 102 mEq/L (ref 96–112)
GFR: 65.3 mL/min (ref >60.00)
GLUCOSE: 69 mg/dL — AB (ref 70–99)
Potassium: 3.8 mEq/L (ref 3.5–5.1)
Sodium: 138 mEq/L (ref 135–145)

## 2014-08-07 LAB — HEPATIC FUNCTION PANEL
ALT: 18 U/L (ref 0–35)
AST: 30 U/L (ref 0–37)
Albumin: 3.8 g/dL (ref 3.5–5.2)
Alkaline Phosphatase: 78 U/L (ref 39–117)
BILIRUBIN DIRECT: 0.1 mg/dL (ref 0.0–0.3)
BILIRUBIN TOTAL: 0.7 mg/dL (ref 0.2–1.2)
Total Protein: 7.8 g/dL (ref 6.0–8.3)

## 2014-08-07 LAB — URINALYSIS, ROUTINE W REFLEX MICROSCOPIC
Bilirubin Urine: NEGATIVE
HGB URINE DIPSTICK: NEGATIVE
Ketones, ur: NEGATIVE
NITRITE: POSITIVE — AB
PH: 6.5 (ref 5.0–8.0)
Specific Gravity, Urine: <=1.005 — AB (ref 1.000–1.030)
TOTAL PROTEIN, URINE-UPE24: NEGATIVE
URINE GLUCOSE: NEGATIVE
Urobilinogen, UA: 0.2 (ref 0.0–1.0)

## 2014-08-07 LAB — LIPID PANEL
CHOLESTEROL: 201 mg/dL — AB (ref 0–200)
HDL: 60.8 mg/dL (ref >39.00)
LDL Cholesterol: 101 mg/dL — ABNORMAL HIGH (ref 0–99)
NONHDL: 140.2
Total CHOL/HDL Ratio: 3
Triglycerides: 198 mg/dL — ABNORMAL HIGH (ref 0.0–149.0)
VLDL: 39.6 mg/dL (ref 0.0–40.0)

## 2014-08-07 LAB — TSH: TSH: 3.93 u[IU]/mL (ref 0.35–4.50)

## 2014-08-07 MED ORDER — METOPROLOL SUCCINATE ER 50 MG PO TB24: 50.0000 mg | ORAL_TABLET | Freq: Every day | ORAL | Status: DC

## 2014-08-07 MED ORDER — POTASSIUM CHLORIDE ER 10 MEQ PO TBCR: 10.0000 meq | EXTENDED_RELEASE_TABLET | Freq: Every day | ORAL | Status: DC

## 2014-08-07 MED ORDER — CEPHALEXIN 500 MG PO CAPS: 500.0000 mg | ORAL_CAPSULE | Freq: Four times a day (QID) | ORAL | Status: DC

## 2014-08-07 MED ORDER — VENLAFAXINE HCL ER 150 MG PO CP24: 150.0000 mg | ORAL_CAPSULE | Freq: Every day | ORAL | Status: DC

## 2014-08-07 MED ORDER — ROSUVASTATIN CALCIUM 20 MG PO TABS: 20.0000 mg | ORAL_TABLET | Freq: Every day | ORAL | Status: DC

## 2014-08-07 MED ORDER — ALPRAZOLAM 0.25 MG PO TABS: 0.2500 mg | ORAL_TABLET | Freq: Two times a day (BID) | ORAL | Status: DC | PRN

## 2014-08-07 MED ORDER — TRIAMTERENE-HCTZ 37.5-25 MG PO TABS: 1.0000 | ORAL_TABLET | Freq: Every day | ORAL | Status: DC

## 2014-08-07 NOTE — Assessment & Plan Note (Signed)
stable overall by history and exam, recent data reviewed with pt, and pt to continue medical treatment as before,  to f/u any worsening symptoms or concerns Lab Results  Component Value Date   WBC 7.9 07/18/2013   HGB 12.8 07/18/2013   HCT 38.5 07/18/2013   PLT 271.0 07/18/2013   GLUCOSE 73 07/18/2013   CHOL 171 02/11/2013   TRIG 124.0 02/11/2013   HDL 72.10 02/11/2013   LDLDIRECT 134.2 11/03/2011   LDLCALC 74 02/11/2013   ALT 17 07/18/2013   AST 24 07/18/2013   NA 135 07/18/2013   K 3.3* 07/18/2013   CL 98 07/18/2013   CREATININE 1.0 07/18/2013   BUN 13 07/18/2013   CO2 32 07/18/2013   TSH 6.33* 02/11/2013   INR 1.0 11/10/2008

## 2014-08-07 NOTE — Patient Instructions (Addendum)
You had the flu shot today  Please continue all other medications as before, and refills have been done if requested - the xanax  Please have the pharmacy call with any other refills you may need.  Please continue your efforts at being more active, low cholesterol diet, and weight control.  You are otherwise up to date with prevention measures today.  Please keep your appointments with your specialists as you may have planned  Please go to the LAB in the Basement (turn left off the elevator) for the tests to be done today  You will be contacted by phone if any changes need to be made immediately.  Otherwise, you will receive a letter about your results with an explanation, but please check with MyChart first.  Please remember to sign up for MyChart if you have not done so, as this will be important to you in the future with finding out test results, communicating by private email, and scheduling acute appointments online when needed.  Please return in 1 year for your yearly visit, or sooner if needed

## 2014-08-07 NOTE — Progress Notes (Signed)
Pre visit review using our clinic review tool, if applicable. No additional management support is needed unless otherwise documented below in the visit note. 

## 2014-08-07 NOTE — Assessment & Plan Note (Signed)
stable overall by history and exam, recent data reviewed with pt, and pt to continue medical treatment as before,  to f/u any worsening symptoms or concerns Lab Results  Component Value Date   TSH 6.33* 02/11/2013

## 2014-08-07 NOTE — Progress Notes (Signed)
Subjective:    Patient ID: Elizabeth Buckley, female    DOB: 1951-05-01, 63 y.o.   MRN: [id number]  HPI  Here for yearly f/u;  Overall doing ok;  Pt denies CP, worsening SOB, DOE, wheezing, orthopnea, PND, worsening LE edema, palpitations, dizziness or syncope.  Pt denies neurological change such as new headache, facial or extremity weakness.  Pt denies polydipsia, polyuria, or low sugar symptoms. Pt states overall good compliance with treatment and medications, good tolerability, and has been trying to follow lower cholesterol diet.  Pt denies worsening depressive symptoms, suicidal ideation or panic. No fever, night sweats, wt loss, loss of appetite, or other constitutional symptoms.  Pt states good ability with ADL's, has low fall risk, home safety reviewed and adequate, no other significant changes in hearing or vision, and only occasionally active with exercise.  Started yoga recently, feels better. S/p recent viral illness with fever up to 103, chills, poor taste, los 5 lbs, now resolved.   Mother died recently with dementia. /Denies hyper or hypo thyroid symptoms such as voice, skin or hair change. Past Medical History  Diagnosis Date  . ANXIETY   . BACK PAIN   . CONSTIPATION, CHRONIC   . DEPRESSION   . HYPERLIPIDEMIA   . HYPERTENSION   . NASH (NON-ALCOHOLIC STEATOHEPATITIS)   . Obesity, unspecified   . OSTEOPENIA   . Cataract   . Anemia   . GERD (gastroesophageal reflux disease)   . Pneumonia     x3   Past Surgical History  Procedure Laterality Date  . Electrocardiogram  07/27/2006  . Appendectomy    . Cholecystectomy    . Tubal ligation    . Trigger finger repair left thumb    . Cataract surgery      left and right eye  . S/p lumbar surgery    . S/p liver biopsy      Dr. Carlean Purl  . Upper gastrointestinal endoscopy  01/03/2003  . Colonoscopy w/ biopsies and polypectomy  01/03/2003    submucosal lesion  . Abdominal hysterectomy    . Bladder suspension  01/18/2012   Procedure: TRANSVAGINAL TAPE (TVT) PROCEDURE;  Surgeon: Daria Pastures, MD;  Location: [facility];  Service: Gynecology;  Laterality: N/A;  . Cystocele repair  01/18/2012    Procedure: ANTERIOR REPAIR (CYSTOCELE);  Surgeon: Daria Pastures, MD;  Location: [facility];  Service: Gynecology;  Laterality: N/A;  . Cystoscopy  01/18/2012    Procedure: CYSTOSCOPY;  Surgeon: Daria Pastures, MD;  Location: [facility];  Service: Gynecology;  Laterality: N/A;  . Cataract extraction      both eyes    reports that she quit smoking about 13 years ago. She has never used smokeless tobacco. She reports that she drinks alcohol. She reports that she does not use illicit drugs. family history includes Alcohol abuse in her other; Heart disease in her father; Hyperlipidemia in her other; Hypertension in her other. There is no history of Colon polyps, Esophageal cancer, Stomach cancer, or Rectal cancer. Allergies  Allergen Reactions  . Codeine Nausea Only   Current Outpatient Prescriptions on File Prior to Visit  Medication Sig Dispense Refill  . aspirin 81 MG tablet Take 81 mg by mouth daily.       . Cholecalciferol (VITAMIN D3) 1000 UNITS CAPS Take 5,000 Units by mouth daily.       Marland Kitchen ibuprofen (ADVIL,MOTRIN) 200 MG tablet Take 400 mg by mouth daily.      . meclizine (ANTIVERT)  12.5 MG tablet Take 1 tablet (12.5 mg total) by mouth 3 (three) times daily as needed.  30 tablet  2  . polyethylene glycol (MIRALAX / GLYCOLAX) packet Take 17 g by mouth daily as needed. For constipation       No current facility-administered medications on file prior to visit.   Review of Systems Constitutional: Negative for increased diaphoresis, other activity, appetite or other siginficant weight change  HENT: Negative for worsening hearing loss, ear pain, facial swelling, mouth sores and neck stiffness.   Eyes: Negative for other worsening pain, redness or visual disturbance.  Respiratory: Negative for shortness of breath and  wheezing.   Cardiovascular: Negative for chest pain and palpitations.  Gastrointestinal: Negative for diarrhea, blood in stool, abdominal distention or other pain Genitourinary: Negative for hematuria, flank pain or change in urine volume.  Musculoskeletal: Negative for myalgias or other joint complaints.  Skin: Negative for color change and wound.  Neurological: Negative for syncope and numbness. other than noted Hematological: Negative for adenopathy. or other swelling Psychiatric/Behavioral: Negative for hallucinations, self-injury, decreased concentration or other worsening agitation.      Objective:   Physical Exam BP 104/72  Pulse 81  Temp(Src) 98.2 F (36.8 C) (Oral)  Wt 216 lb 8 oz (98.204 kg)  SpO2 95% VS noted,  Constitutional: Pt is oriented to person, place, and time. Appears well-developed and well-nourished.  Head: Normocephalic and atraumatic.  Right Ear: External ear normal.  Left Ear: External ear normal.  Nose: Nose normal.  Mouth/Throat: Oropharynx is clear and moist.  Eyes: Conjunctivae and EOM are normal. Pupils are equal, round, and reactive to light.  Neck: Normal range of motion. Neck supple. No JVD present. No tracheal deviation present.  Cardiovascular: Normal rate, regular rhythm, normal heart sounds and intact distal pulses.   Pulmonary/Chest: Effort normal and breath sounds without rales or wheezing  Abdominal: Soft. Bowel sounds are normal. NT. No HSM  Musculoskeletal: Normal range of motion. Exhibits no edema.  Lymphadenopathy:  Has no cervical adenopathy.  Neurological: Pt is alert and oriented to person, place, and time. Pt has normal reflexes. No cranial nerve deficit. Motor grossly intact Skin: Skin is warm and dry. No rash noted.  Psychiatric:  Has normal mood and affect. Behavior is normal.      Assessment & Plan:

## 2014-08-07 NOTE — Telephone Encounter (Signed)
Pt stated she heard Dr. Jenny Reichmann told her to come back in a year but the paper said 6 month. Pt was wondering which date is it correct?

## 2014-08-07 NOTE — Assessment & Plan Note (Signed)
stable overall by history and exam, recent data reviewed with pt, and pt to continue medical treatment as before,  to f/u any worsening symptoms or concerns Lab Results  Component Value Date   LDLCALC 74 02/11/2013

## 2014-08-07 NOTE — Assessment & Plan Note (Signed)
stable overall by history and exam, recent data reviewed with pt, and pt to continue medical treatment as before,  to f/u any worsening symptoms or concerns BP Readings from Last 3 Encounters:  08/07/14 104/72  01/24/14 120/90  01/22/14 126/82

## 2014-08-07 NOTE — Telephone Encounter (Signed)
1 yr is ok, sooner if needed

## 2014-08-08 NOTE — Telephone Encounter (Signed)
Scheduled cpe one year out

## 2014-10-22 ENCOUNTER — Other Ambulatory Visit: Payer: Self-pay

## 2014-10-22 DIAGNOSIS — Z1231 Encounter for screening mammogram for malignant neoplasm of breast: Secondary | ICD-10-CM

## 2014-11-11 ENCOUNTER — Ambulatory Visit
Admission: RE | Admit: 2014-11-11 | Discharge: 2014-11-11 | Disposition: A | Discharge status: Home / Self Care | Payer: Medicare Other | Source: Ambulatory Visit

## 2014-11-11 DIAGNOSIS — Z1231 Encounter for screening mammogram for malignant neoplasm of breast: Secondary | ICD-10-CM

## 2015-01-12 DIAGNOSIS — Z961 Presence of intraocular lens: Secondary | ICD-10-CM | POA: Diagnosis not present

## 2015-01-12 DIAGNOSIS — H35372 Puckering of macula, left eye: Secondary | ICD-10-CM | POA: Diagnosis not present

## 2015-01-12 DIAGNOSIS — H04123 Dry eye syndrome of bilateral lacrimal glands: Secondary | ICD-10-CM | POA: Diagnosis not present

## 2015-03-30 DIAGNOSIS — D2311 Other benign neoplasm of skin of right eyelid, including canthus: Secondary | ICD-10-CM | POA: Diagnosis not present

## 2015-05-15 DIAGNOSIS — D2312 Other benign neoplasm of skin of left eyelid, including canthus: Secondary | ICD-10-CM | POA: Diagnosis not present

## 2015-06-12 DIAGNOSIS — D2312 Other benign neoplasm of skin of left eyelid, including canthus: Secondary | ICD-10-CM | POA: Diagnosis not present

## 2015-07-29 ENCOUNTER — Telehealth: Payer: Self-pay

## 2015-07-29 NOTE — Telephone Encounter (Signed)
Spoke to pt about AWV with Manuela Schwartz. Pt declined and stated that everything was okay, maybe next year.   DEXA ordered and schedule. Note on CPE visit to add HIV screening lab.

## 2015-08-13 ENCOUNTER — Other Ambulatory Visit (INDEPENDENT_AMBULATORY_CARE_PROVIDER_SITE_OTHER): Payer: Medicare Other

## 2015-08-13 ENCOUNTER — Ambulatory Visit (INDEPENDENT_AMBULATORY_CARE_PROVIDER_SITE_OTHER)
Admission: RE | Admit: 2015-08-13 | Discharge: 2015-08-13 | Disposition: A | Discharge status: Home / Self Care | Payer: Medicare Other | Source: Ambulatory Visit | Attending: Internal Medicine | Admitting: Internal Medicine

## 2015-08-13 ENCOUNTER — Encounter: Payer: Self-pay | Admitting: Internal Medicine

## 2015-08-13 ENCOUNTER — Ambulatory Visit (INDEPENDENT_AMBULATORY_CARE_PROVIDER_SITE_OTHER): Payer: Medicare Other | Admitting: Internal Medicine

## 2015-08-13 VITALS — BP 110/76 | HR 67 | Temp 97.9°F | Ht 63.0 in | Wt 221.0 lb

## 2015-08-13 DIAGNOSIS — Z0000 Encounter for general adult medical examination without abnormal findings: Secondary | ICD-10-CM | POA: Diagnosis not present

## 2015-08-13 DIAGNOSIS — F329 Major depressive disorder, single episode, unspecified: Secondary | ICD-10-CM

## 2015-08-13 DIAGNOSIS — Z23 Encounter for immunization: Secondary | ICD-10-CM | POA: Diagnosis not present

## 2015-08-13 DIAGNOSIS — I10 Essential (primary) hypertension: Secondary | ICD-10-CM | POA: Diagnosis not present

## 2015-08-13 DIAGNOSIS — E039 Hypothyroidism, unspecified: Secondary | ICD-10-CM

## 2015-08-13 DIAGNOSIS — E785 Hyperlipidemia, unspecified: Secondary | ICD-10-CM

## 2015-08-13 DIAGNOSIS — M810 Age-related osteoporosis without current pathological fracture: Secondary | ICD-10-CM

## 2015-08-13 DIAGNOSIS — F32A Depression, unspecified: Secondary | ICD-10-CM

## 2015-08-13 LAB — CBC WITH DIFFERENTIAL/PLATELET
BASOS PCT: 0.4 % (ref 0.0–3.0)
Basophils Absolute: 0 10*3/uL (ref 0.0–0.1)
EOS PCT: 6.6 % — AB (ref 0.0–5.0)
Eosinophils Absolute: 0.5 10*3/uL (ref 0.0–0.7)
HEMATOCRIT: 38.4 % (ref 36.0–46.0)
HEMOGLOBIN: 12.5 g/dL (ref 12.0–15.0)
Lymphocytes Relative: 44.4 % (ref 12.0–46.0)
Lymphs Abs: 3.1 10*3/uL (ref 0.7–4.0)
MCHC: 32.5 g/dL (ref 30.0–36.0)
MCV: 88.2 fl (ref 78.0–100.0)
MONO ABS: 0.5 10*3/uL (ref 0.1–1.0)
MONOS PCT: 7.1 % (ref 3.0–12.0)
Neutro Abs: 2.9 10*3/uL (ref 1.4–7.7)
Neutrophils Relative %: 41.5 % — ABNORMAL LOW (ref 43.0–77.0)
Platelets: 309 10*3/uL (ref 150.0–400.0)
RBC: 4.36 Mil/uL (ref 3.87–5.11)
RDW: 15.6 % — AB (ref 11.5–15.5)
WBC: 6.9 10*3/uL (ref 4.0–10.5)

## 2015-08-13 LAB — LIPID PANEL
CHOL/HDL RATIO: 3
Cholesterol: 189 mg/dL (ref 0–200)
HDL: 58.7 mg/dL (ref >39.00)
NonHDL: 130.77
Triglycerides: 227 mg/dL — ABNORMAL HIGH (ref 0.0–149.0)
VLDL: 45.4 mg/dL — AB (ref 0.0–40.0)

## 2015-08-13 LAB — URINALYSIS, ROUTINE W REFLEX MICROSCOPIC
BILIRUBIN URINE: NEGATIVE
HGB URINE DIPSTICK: NEGATIVE
KETONES UR: NEGATIVE
NITRITE: POSITIVE — AB
Specific Gravity, Urine: <=1.005 — AB (ref 1.000–1.030)
Total Protein, Urine: NEGATIVE
Urine Glucose: NEGATIVE
Urobilinogen, UA: 1 (ref 0.0–1.0)
pH: 6.5 (ref 5.0–8.0)

## 2015-08-13 LAB — HEPATIC FUNCTION PANEL
ALK PHOS: 79 U/L (ref 39–117)
ALT: 18 U/L (ref 0–35)
AST: 25 U/L (ref 0–37)
Albumin: 4.1 g/dL (ref 3.5–5.2)
BILIRUBIN TOTAL: 0.5 mg/dL (ref 0.2–1.2)
Bilirubin, Direct: 0 mg/dL (ref 0.0–0.3)
Total Protein: 7.6 g/dL (ref 6.0–8.3)

## 2015-08-13 LAB — BASIC METABOLIC PANEL
BUN: 14 mg/dL (ref 6–23)
CO2: 32 mEq/L (ref 19–32)
Calcium: 9.6 mg/dL (ref 8.4–10.5)
Chloride: 97 mEq/L (ref 96–112)
Creatinine, Ser: 1.1 mg/dL (ref 0.40–1.20)
GFR: 52.96 mL/min — AB (ref >60.00)
Glucose, Bld: 86 mg/dL (ref 70–99)
POTASSIUM: 3.3 meq/L — AB (ref 3.5–5.1)
SODIUM: 136 meq/L (ref 135–145)

## 2015-08-13 LAB — TSH: TSH: 3.28 u[IU]/mL (ref 0.35–4.50)

## 2015-08-13 LAB — LDL CHOLESTEROL, DIRECT: LDL DIRECT: 100 mg/dL

## 2015-08-13 MED ORDER — POTASSIUM CHLORIDE ER 10 MEQ PO TBCR: 10.0000 meq | EXTENDED_RELEASE_TABLET | Freq: Every day | ORAL | Status: DC

## 2015-08-13 MED ORDER — ROSUVASTATIN CALCIUM 20 MG PO TABS: 20.0000 mg | ORAL_TABLET | Freq: Every day | ORAL | Status: DC

## 2015-08-13 MED ORDER — METOPROLOL SUCCINATE ER 50 MG PO TB24: 50.0000 mg | ORAL_TABLET | Freq: Every day | ORAL | Status: DC

## 2015-08-13 MED ORDER — TRIAMTERENE-HCTZ 37.5-25 MG PO TABS: 1.0000 | ORAL_TABLET | Freq: Every day | ORAL | Status: DC

## 2015-08-13 MED ORDER — VENLAFAXINE HCL ER 150 MG PO CP24: 150.0000 mg | ORAL_CAPSULE | Freq: Every day | ORAL | Status: DC

## 2015-08-13 MED ORDER — ALPRAZOLAM 0.25 MG PO TABS: 0.2500 mg | ORAL_TABLET | Freq: Two times a day (BID) | ORAL | Status: DC | PRN

## 2015-08-13 NOTE — Progress Notes (Signed)
Pre visit review using our clinic review tool, if applicable. No additional management support is needed unless otherwise documented below in the visit note. 

## 2015-08-13 NOTE — Assessment & Plan Note (Signed)
stable overall by history and exam, recent data reviewed with pt, and pt to continue medical treatment as before,  to f/u any worsening symptoms or concerns,  BP Readings from Last 3 Encounters:  08/13/15 110/76  08/07/14 104/72  01/24/14 120/90   ECG reviewed as per emr

## 2015-08-13 NOTE — Assessment & Plan Note (Signed)
stable overall by history and exam, recent data reviewed with pt, and pt to continue medical treatment as before,  to f/u any worsening symptoms or concerns Lab Results  Component Value Date   WBC 6.4 08/07/2014   HGB 14.3 08/07/2014   HCT 42.9 08/07/2014   PLT 252.0 08/07/2014   GLUCOSE 69* 08/07/2014   CHOL 201* 08/07/2014   TRIG 198.0* 08/07/2014   HDL 60.80 08/07/2014   LDLDIRECT 134.2 11/03/2011   LDLCALC 101* 08/07/2014   ALT 18 08/07/2014   AST 30 08/07/2014   NA 138 08/07/2014   K 3.8 08/07/2014   CL 102 08/07/2014   CREATININE 0.9 08/07/2014   BUN 18 08/07/2014   CO2 29 08/07/2014   TSH 3.93 08/07/2014   INR 1.0 11/10/2008

## 2015-08-13 NOTE — Assessment & Plan Note (Signed)
stable overall by history and exam, recent data reviewed with pt, and pt to continue medical treatment as before,  to f/u any worsening symptoms or concerns Lab Results  Component Value Date   LDLCALC 101* 08/07/2014   For f/u lab, lower chol  Diet, goal < 100

## 2015-08-13 NOTE — Patient Instructions (Signed)
You had the flu shot today  Please return for a nursing visit appt in 2 weeks for the Prevnar 13  Your EKG was [facility] today  Please continue all other medications as before, and refills have been done if requested.  Please have the pharmacy call with any other refills you may need.  Please continue your efforts at being more active, low cholesterol diet, and weight control.  You are otherwise up to date with prevention measures today.  Please keep your appointments with your specialists as you may have planned  Please go to the LAB in the Basement (turn left off the elevator) for the tests to be done today  You will be contacted by phone if any changes need to be made immediately.  Otherwise, you will receive a letter about your results with an explanation, but please check with MyChart first.  Please remember to sign up for MyChart if you have not done so, as this will be important to you in the future with finding out test results, communicating by private email, and scheduling acute appointments online when needed.  Please return in 1 year for your yearly visit, or sooner if needed

## 2015-08-13 NOTE — Progress Notes (Signed)
Subjective:    Patient ID: Elizabeth Buckley, female    DOB: Feb 03, 1951, 64 y.o.   MRN: [id number]  HPI  Here for yearly f/u;  Overall doing ok;  Pt denies Chest pain, worsening SOB, DOE, wheezing, orthopnea, PND, worsening LE edema, palpitations, dizziness or syncope.  Pt denies neurological change such as new headache, facial or extremity weakness.  Pt denies polydipsia, polyuria, or low sugar symptoms. Pt states overall good compliance with treatment and medications, good tolerability, and has been trying to follow appropriate diet.  Pt denies worsening depressive symptoms, suicidal ideation or panic. No fever, night sweats, wt loss, loss of appetite, or other constitutional symptoms.  Pt states good ability with ADL's, has low fall risk, home safety reviewed and adequate, no other significant changes in hearing or vision, and only occasionally active with exercise.  Pt continues to have recurring LBP without change in severity, bowel or bladder change, fever, wt loss,  worsening LE pain/numbness/weakness, gait change or falls. Past Medical History  Diagnosis Date  . ANXIETY   . BACK PAIN   . CONSTIPATION, CHRONIC   . DEPRESSION   . HYPERLIPIDEMIA   . HYPERTENSION   . NASH (NON-ALCOHOLIC STEATOHEPATITIS)   . Obesity, unspecified   . OSTEOPENIA   . Cataract   . Anemia   . GERD (gastroesophageal reflux disease)   . Pneumonia     x3   Past Surgical History  Procedure Laterality Date  . Electrocardiogram  07/27/2006  . Appendectomy    . Cholecystectomy    . Tubal ligation    . Trigger finger repair left thumb    . Cataract surgery      left and right eye  . S/p lumbar surgery    . S/p liver biopsy      Dr. Carlean Purl  . Upper gastrointestinal endoscopy  01/03/2003  . Colonoscopy w/ biopsies and polypectomy  01/03/2003    submucosal lesion  . Abdominal hysterectomy    . Bladder suspension  01/18/2012    Procedure: TRANSVAGINAL TAPE (TVT) PROCEDURE;  Surgeon: Daria Pastures, MD;   Location: [facility];  Service: Gynecology;  Laterality: N/A;  . Cystocele repair  01/18/2012    Procedure: ANTERIOR REPAIR (CYSTOCELE);  Surgeon: Daria Pastures, MD;  Location: [facility];  Service: Gynecology;  Laterality: N/A;  . Cystoscopy  01/18/2012    Procedure: CYSTOSCOPY;  Surgeon: Daria Pastures, MD;  Location: [facility];  Service: Gynecology;  Laterality: N/A;  . Cataract extraction      both eyes    reports that she quit smoking about 14 years ago. She has never used smokeless tobacco. She reports that she drinks alcohol. She reports that she does not use illicit drugs. family history includes Alcohol abuse in her other; Heart disease in her father; Hyperlipidemia in her other; Hypertension in her other. There is no history of Colon polyps, Esophageal cancer, Stomach cancer, or Rectal cancer. Allergies  Allergen Reactions  . Codeine Nausea Only   Current Outpatient Prescriptions on File Prior to Visit  Medication Sig Dispense Refill  . aspirin 81 MG tablet Take 81 mg by mouth daily.     . Cholecalciferol (VITAMIN D3) 1000 UNITS CAPS Take 5,000 Units by mouth daily.     Marland Kitchen ibuprofen (ADVIL,MOTRIN) 200 MG tablet Take 400 mg by mouth daily.    . meclizine (ANTIVERT) 12.5 MG tablet Take 1 tablet (12.5 mg total) by mouth 3 (three) times daily as needed. 30 tablet 2  .  polyethylene glycol (MIRALAX / GLYCOLAX) packet Take 17 g by mouth daily as needed. For constipation     No current facility-administered medications on file prior to visit.   Review of Systems Constitutional: Negative for increased diaphoresis, other activity, appetite or siginficant weight change other than noted HENT: Negative for worsening hearing loss, ear pain, facial swelling, mouth sores and neck stiffness.   Eyes: Negative for other worsening pain, redness or visual disturbance.  Respiratory: Negative for shortness of breath and wheezing  Cardiovascular: Negative for chest pain and palpitations.    Gastrointestinal: Negative for diarrhea, blood in stool, abdominal distention or other pain Genitourinary: Negative for hematuria, flank pain or change in urine volume.  Musculoskeletal: Negative for myalgias or other joint complaints.  Skin: Negative for color change and wound or drainage.  Neurological: Negative for syncope and numbness. other than noted Hematological: Negative for adenopathy. or other swelling Psychiatric/Behavioral: Negative for hallucinations, SI, self-injury, decreased concentration or other worsening agitation.      Objective:   Physical Exam BP 110/76 mmHg  Pulse 67  Temp(Src) 97.9 F (36.6 C) (Oral)  Ht 5\' 3"  (1.6 m)  Wt 221 lb (100.245 kg)  BMI 39.16 kg/m2  SpO2 95% VS noted,  Constitutional: Pt is oriented to person, place, and time. Appears well-developed and well-nourished, in no significant distress Head: Normocephalic and atraumatic.  Right Ear: External ear normal.  Left Ear: External ear normal.  Nose: Nose normal.  Mouth/Throat: Oropharynx is clear and moist.  Eyes: Conjunctivae and EOM are normal. Pupils are equal, round, and reactive to light.  Neck: Normal range of motion. Neck supple. No JVD present. No tracheal deviation present or significant neck LA or mass Cardiovascular: Normal rate, regular rhythm, normal heart sounds and intact distal pulses.   Pulmonary/Chest: Effort normal and breath sounds without rales or wheezing  Abdominal: Soft. Bowel sounds are normal. NT. No HSM  Musculoskeletal: Normal range of motion. Exhibits no edema.  Lymphadenopathy:  Has no cervical adenopathy.  Neurological: Pt is alert and oriented to person, place, and time. Pt has normal reflexes. No cranial nerve deficit. Motor grossly intact Skin: Skin is warm and dry. No rash noted.  Psychiatric:  Has mild nervous mood and affect. Behavior is normal.      Assessment & Plan:

## 2015-08-13 NOTE — Assessment & Plan Note (Signed)
stable overall by history and exam, recent data reviewed with pt, and pt to continue medical treatment as before,  to f/u any worsening symptoms or concerns Lab Results  Component Value Date   TSH 3.93 08/07/2014   For /fu lab

## 2015-08-17 ENCOUNTER — Telehealth: Payer: Self-pay | Admitting: Internal Medicine

## 2015-08-17 ENCOUNTER — Ambulatory Visit: Payer: Medicare Other | Admitting: Internal Medicine

## 2015-08-17 NOTE — Telephone Encounter (Signed)
No charge. 

## 2015-08-17 NOTE — Telephone Encounter (Signed)
Please advise 

## 2015-08-17 NOTE — Telephone Encounter (Signed)
No charge per Dr. Gessner. 

## 2015-08-27 ENCOUNTER — Ambulatory Visit (INDEPENDENT_AMBULATORY_CARE_PROVIDER_SITE_OTHER): Payer: Medicare Other

## 2015-08-27 DIAGNOSIS — Z23 Encounter for immunization: Secondary | ICD-10-CM

## 2015-08-31 DIAGNOSIS — D485 Neoplasm of uncertain behavior of skin: Secondary | ICD-10-CM | POA: Diagnosis not present

## 2015-10-08 ENCOUNTER — Other Ambulatory Visit: Payer: Self-pay

## 2015-10-08 DIAGNOSIS — Z1231 Encounter for screening mammogram for malignant neoplasm of breast: Secondary | ICD-10-CM

## 2015-10-20 ENCOUNTER — Ambulatory Visit: Payer: Medicare Other | Admitting: Internal Medicine

## 2015-11-17 ENCOUNTER — Ambulatory Visit
Admission: RE | Admit: 2015-11-17 | Discharge: 2015-11-17 | Disposition: A | Discharge status: Home / Self Care | Payer: Medicare Other | Source: Ambulatory Visit

## 2015-11-17 DIAGNOSIS — Z1231 Encounter for screening mammogram for malignant neoplasm of breast: Secondary | ICD-10-CM

## 2015-11-19 ENCOUNTER — Other Ambulatory Visit: Payer: Self-pay | Admitting: Family Medicine

## 2015-11-19 DIAGNOSIS — R928 Other abnormal and inconclusive findings on diagnostic imaging of breast: Secondary | ICD-10-CM

## 2015-11-25 ENCOUNTER — Other Ambulatory Visit: Payer: Medicare Other

## 2015-12-01 DIAGNOSIS — Z961 Presence of intraocular lens: Secondary | ICD-10-CM | POA: Diagnosis not present

## 2015-12-04 ENCOUNTER — Other Ambulatory Visit: Payer: Self-pay | Admitting: Obstetrics and Gynecology

## 2015-12-04 DIAGNOSIS — R928 Other abnormal and inconclusive findings on diagnostic imaging of breast: Secondary | ICD-10-CM

## 2015-12-07 ENCOUNTER — Other Ambulatory Visit: Payer: Medicare Other

## 2015-12-08 ENCOUNTER — Other Ambulatory Visit: Payer: Self-pay | Admitting: Internal Medicine

## 2015-12-08 DIAGNOSIS — R928 Other abnormal and inconclusive findings on diagnostic imaging of breast: Secondary | ICD-10-CM

## 2015-12-16 ENCOUNTER — Ambulatory Visit
Admission: RE | Admit: 2015-12-16 | Discharge: 2015-12-16 | Disposition: A | Discharge status: Home / Self Care | Payer: Medicare Other | Source: Ambulatory Visit | Attending: Internal Medicine | Admitting: Internal Medicine

## 2015-12-16 DIAGNOSIS — R928 Other abnormal and inconclusive findings on diagnostic imaging of breast: Secondary | ICD-10-CM

## 2015-12-16 DIAGNOSIS — N63 Unspecified lump in breast: Secondary | ICD-10-CM | POA: Diagnosis not present

## 2016-05-11 ENCOUNTER — Other Ambulatory Visit: Payer: Self-pay | Admitting: Internal Medicine

## 2016-05-11 DIAGNOSIS — N6310 Unspecified lump in the right breast, unspecified quadrant: Secondary | ICD-10-CM

## 2016-06-16 ENCOUNTER — Ambulatory Visit
Admission: RE | Admit: 2016-06-16 | Discharge: 2016-06-16 | Disposition: A | Discharge status: Home / Self Care | Payer: Medicare Other | Source: Ambulatory Visit | Attending: Internal Medicine | Admitting: Internal Medicine

## 2016-06-16 ENCOUNTER — Other Ambulatory Visit: Payer: Self-pay | Admitting: Internal Medicine

## 2016-06-16 DIAGNOSIS — N63 Unspecified lump in breast: Secondary | ICD-10-CM | POA: Diagnosis not present

## 2016-06-16 DIAGNOSIS — N6310 Unspecified lump in the right breast, unspecified quadrant: Secondary | ICD-10-CM

## 2016-06-20 ENCOUNTER — Ambulatory Visit
Admission: RE | Admit: 2016-06-20 | Discharge: 2016-06-20 | Disposition: A | Discharge status: Home / Self Care | Payer: Medicare Other | Source: Ambulatory Visit | Attending: Internal Medicine | Admitting: Internal Medicine

## 2016-06-20 ENCOUNTER — Other Ambulatory Visit: Payer: Self-pay | Admitting: Internal Medicine

## 2016-06-20 DIAGNOSIS — N6310 Unspecified lump in the right breast, unspecified quadrant: Secondary | ICD-10-CM

## 2016-06-20 DIAGNOSIS — N63 Unspecified lump in breast: Secondary | ICD-10-CM | POA: Diagnosis not present

## 2016-06-20 DIAGNOSIS — N6011 Diffuse cystic mastopathy of right breast: Secondary | ICD-10-CM | POA: Diagnosis not present

## 2016-08-12 ENCOUNTER — Other Ambulatory Visit (INDEPENDENT_AMBULATORY_CARE_PROVIDER_SITE_OTHER): Payer: Medicare Other

## 2016-08-12 ENCOUNTER — Other Ambulatory Visit: Payer: Self-pay | Admitting: Internal Medicine

## 2016-08-12 ENCOUNTER — Ambulatory Visit: Payer: Medicare Other | Admitting: Internal Medicine

## 2016-08-12 ENCOUNTER — Encounter: Payer: Self-pay | Admitting: Internal Medicine

## 2016-08-12 ENCOUNTER — Ambulatory Visit (INDEPENDENT_AMBULATORY_CARE_PROVIDER_SITE_OTHER): Payer: Medicare Other | Admitting: Internal Medicine

## 2016-08-12 VITALS — BP 124/82 | HR 100 | Temp 98.0°F | Resp 20 | Wt 224.0 lb

## 2016-08-12 DIAGNOSIS — E785 Hyperlipidemia, unspecified: Secondary | ICD-10-CM | POA: Diagnosis not present

## 2016-08-12 DIAGNOSIS — F32A Depression, unspecified: Secondary | ICD-10-CM

## 2016-08-12 DIAGNOSIS — I10 Essential (primary) hypertension: Secondary | ICD-10-CM

## 2016-08-12 DIAGNOSIS — J019 Acute sinusitis, unspecified: Secondary | ICD-10-CM

## 2016-08-12 DIAGNOSIS — E039 Hypothyroidism, unspecified: Secondary | ICD-10-CM

## 2016-08-12 DIAGNOSIS — F329 Major depressive disorder, single episode, unspecified: Secondary | ICD-10-CM | POA: Diagnosis not present

## 2016-08-12 DIAGNOSIS — Z23 Encounter for immunization: Secondary | ICD-10-CM

## 2016-08-12 LAB — HEPATIC FUNCTION PANEL
ALBUMIN: 4.3 g/dL (ref 3.5–5.2)
ALT: 22 U/L (ref 0–35)
AST: 34 U/L (ref 0–37)
Alkaline Phosphatase: 79 U/L (ref 39–117)
Bilirubin, Direct: 0 mg/dL (ref 0.0–0.3)
TOTAL PROTEIN: 8.2 g/dL (ref 6.0–8.3)
Total Bilirubin: 0.5 mg/dL (ref 0.2–1.2)

## 2016-08-12 LAB — BASIC METABOLIC PANEL
BUN: 13 mg/dL (ref 6–23)
CALCIUM: 10 mg/dL (ref 8.4–10.5)
CHLORIDE: 96 meq/L (ref 96–112)
CO2: 29 meq/L (ref 19–32)
CREATININE: 1.16 mg/dL (ref 0.40–1.20)
GFR: 49.66 mL/min — ABNORMAL LOW (ref >60.00)
GLUCOSE: 90 mg/dL (ref 70–99)
Potassium: 3.8 mEq/L (ref 3.5–5.1)
Sodium: 133 mEq/L — ABNORMAL LOW (ref 135–145)

## 2016-08-12 LAB — LIPID PANEL
CHOL/HDL RATIO: 4
CHOLESTEROL: 211 mg/dL — AB (ref 0–200)
HDL: 60 mg/dL (ref >39.00)
NonHDL: 150.91
Triglycerides: 203 mg/dL — ABNORMAL HIGH (ref 0.0–149.0)
VLDL: 40.6 mg/dL — AB (ref 0.0–40.0)

## 2016-08-12 LAB — CBC WITH DIFFERENTIAL/PLATELET
BASOS ABS: 0 10*3/uL (ref 0.0–0.1)
Basophils Relative: 0.4 % (ref 0.0–3.0)
EOS ABS: 0.4 10*3/uL (ref 0.0–0.7)
Eosinophils Relative: 5.5 % — ABNORMAL HIGH (ref 0.0–5.0)
HEMATOCRIT: 41.2 % (ref 36.0–46.0)
Hemoglobin: 13.6 g/dL (ref 12.0–15.0)
LYMPHS ABS: 2.7 10*3/uL (ref 0.7–4.0)
LYMPHS PCT: 36.6 % (ref 12.0–46.0)
MCHC: 33 g/dL (ref 30.0–36.0)
MCV: 90.2 fl (ref 78.0–100.0)
MONOS PCT: 6.2 % (ref 3.0–12.0)
Monocytes Absolute: 0.5 10*3/uL (ref 0.1–1.0)
NEUTROS PCT: 51.3 % (ref 43.0–77.0)
Neutro Abs: 3.7 10*3/uL (ref 1.4–7.7)
PLATELETS: 310 10*3/uL (ref 150.0–400.0)
RBC: 4.57 Mil/uL (ref 3.87–5.11)
RDW: 14.8 % (ref 11.5–15.5)
WBC: 7.3 10*3/uL (ref 4.0–10.5)

## 2016-08-12 LAB — URINALYSIS, ROUTINE W REFLEX MICROSCOPIC
BILIRUBIN URINE: NEGATIVE
Hgb urine dipstick: NEGATIVE
KETONES UR: NEGATIVE
LEUKOCYTES UA: NEGATIVE
Nitrite: NEGATIVE
PH: 7 (ref 5.0–8.0)
RBC / HPF: NONE SEEN (ref 0–?)
SPECIFIC GRAVITY, URINE: 1.01 (ref 1.000–1.030)
Total Protein, Urine: NEGATIVE
URINE GLUCOSE: NEGATIVE
UROBILINOGEN UA: 1 (ref 0.0–1.0)
WBC, UA: NONE SEEN (ref 0–?)

## 2016-08-12 LAB — TSH: TSH: 6.02 u[IU]/mL — ABNORMAL HIGH (ref 0.35–4.50)

## 2016-08-12 LAB — LDL CHOLESTEROL, DIRECT: Direct LDL: 116 mg/dL

## 2016-08-12 MED ORDER — ROSUVASTATIN CALCIUM 20 MG PO TABS: 20.0000 mg | ORAL_TABLET | Freq: Every day | ORAL | 3 refills | Status: DC

## 2016-08-12 MED ORDER — ALPRAZOLAM 0.25 MG PO TABS: 0.2500 mg | ORAL_TABLET | Freq: Two times a day (BID) | ORAL | 5 refills | Status: DC | PRN

## 2016-08-12 MED ORDER — POTASSIUM CHLORIDE ER 10 MEQ PO TBCR: 10.0000 meq | EXTENDED_RELEASE_TABLET | Freq: Every day | ORAL | 3 refills | Status: DC

## 2016-08-12 MED ORDER — MECLIZINE HCL 12.5 MG PO TABS: 12.5000 mg | ORAL_TABLET | Freq: Three times a day (TID) | ORAL | 2 refills | Status: DC | PRN

## 2016-08-12 MED ORDER — LEVOTHYROXINE SODIUM 25 MCG PO TABS: 25.0000 ug | ORAL_TABLET | Freq: Every day | ORAL | 3 refills | Status: DC

## 2016-08-12 MED ORDER — TRIAMTERENE-HCTZ 37.5-25 MG PO TABS: 1.0000 | ORAL_TABLET | Freq: Every day | ORAL | 3 refills | Status: DC

## 2016-08-12 MED ORDER — METOPROLOL SUCCINATE ER 50 MG PO TB24: 50.0000 mg | ORAL_TABLET | Freq: Every day | ORAL | 3 refills | Status: DC

## 2016-08-12 MED ORDER — VENLAFAXINE HCL ER 150 MG PO CP24: 150.0000 mg | ORAL_CAPSULE | Freq: Every day | ORAL | 3 refills | Status: DC

## 2016-08-12 NOTE — Patient Instructions (Addendum)
You had the Pneumovax shot today  Please continue all other medications as before, and refills have been done if requested.  Please have the pharmacy call with any other refills you may need.  Please continue your efforts at being more active, low cholesterol diet, and weight control.  You are otherwise up to date with prevention measures today.  Please keep your appointments with your specialists as you may have planned  Please go to the LAB in the Basement (turn left off the elevator) for the tests to be done today  You will be contacted by phone if any changes need to be made immediately.  Otherwise, you will receive a letter about your results with an explanation, but please check with MyChart first.  Please remember to sign up for MyChart if you have not done so, as this will be important to you in the future with finding out test results, communicating by private email, and scheduling acute appointments online when needed.  Please return in 1 year for your yearly visit, or sooner if needed 

## 2016-08-12 NOTE — Assessment & Plan Note (Signed)
Asympt, stable overall by history and exam, and pt to continue medical treatment as before,  to f/u any worsening symptoms or concerns

## 2016-08-12 NOTE — Progress Notes (Deleted)
   Subjective:    Patient ID: Elizabeth Buckley, female    DOB: 1950-12-20, 65 y.o.   MRN: [id number]  HPI    Review of Systems     Objective:   Physical Exam        Assessment & Plan:

## 2016-08-12 NOTE — Assessment & Plan Note (Signed)
stable overall by history and exam, recent data reviewed with pt, and pt to continue medical treatment as before,  to f/u any worsening symptoms or concerns BP Readings from Last 3 Encounters:  08/12/16 124/82  08/13/15 110/76  08/07/14 104/72

## 2016-08-12 NOTE — Progress Notes (Signed)
Subjective:    Patient ID: Elizabeth Buckley, female    DOB: Oct 20, 1951, 65 y.o.   MRN: [id number]  HPI  Here for yearly f/u;  Overall doing ok;  Pt denies Chest pain, worsening SOB, DOE, wheezing, orthopnea, PND, worsening LE edema, palpitations, dizziness or syncope.  Pt denies neurological change such as new headache, facial or extremity weakness.  Pt denies polydipsia, polyuria, or low sugar symptoms. Pt states overall good compliance with treatment and medications, good tolerability, and has been trying to follow appropriate diet.  Pt denies worsening depressive symptoms, suicidal ideation or panic. No fever, night sweats, wt loss, loss of appetite, or other constitutional symptoms.  Pt states good ability with ADL's, has low fall risk, home safety reviewed and adequate, no other significant changes in hearing or vision, and only occasionally active with exercise.  Used to walk 3 miles per day before back started hurting.  Overall gained 70 lbs since stopped smoking and less active since 65yo.  Pt continues to have recurring LBP without change in severity, bowel or bladder change, fever, wt loss,  worsening LE pain/numbness/weakness, gait change or falls. Can only sit for 15 min at a time before has to stand, better to lie down.  Has overall gained several lbs, may be making the pain worse  Also,Does have several wks ongoing nasal allergy symptoms with clearish congestion, itch and sneezing, without fever, pain, ST, cough, swelling or wheezing.  Denies hyper or hypo thyroid symptoms such as voice, skin or hair change. Wt Readings from Last 3 Encounters:  08/12/16 224 lb (101.6 kg)  08/13/15 221 lb (100.2 kg)  08/07/14 216 lb 8 oz (98.2 kg)   Past Medical History:  Diagnosis Date  . Anemia   . ANXIETY   . BACK PAIN   . Cataract   . CONSTIPATION, CHRONIC   . DEPRESSION   . GERD (gastroesophageal reflux disease)   . HYPERLIPIDEMIA   . HYPERTENSION   . NASH (NON-ALCOHOLIC STEATOHEPATITIS)     . Obesity, unspecified   . OSTEOPENIA   . Pneumonia    x3   Past Surgical History:  Procedure Laterality Date  . ABDOMINAL HYSTERECTOMY    . APPENDECTOMY    . BLADDER SUSPENSION  01/18/2012   Procedure: TRANSVAGINAL TAPE (TVT) PROCEDURE;  Surgeon: Daria Pastures, MD;  Location: [facility];  Service: Gynecology;  Laterality: N/A;  . CATARACT EXTRACTION     both eyes  . cataract surgery     left and right eye  . CHOLECYSTECTOMY    . COLONOSCOPY W/ BIOPSIES AND POLYPECTOMY  01/03/2003   submucosal lesion  . CYSTOCELE REPAIR  01/18/2012   Procedure: ANTERIOR REPAIR (CYSTOCELE);  Surgeon: Daria Pastures, MD;  Location: [facility];  Service: Gynecology;  Laterality: N/A;  . CYSTOSCOPY  01/18/2012   Procedure: CYSTOSCOPY;  Surgeon: Daria Pastures, MD;  Location: [facility];  Service: Gynecology;  Laterality: N/A;  . ELECTROCARDIOGRAM  07/27/2006  . s/p liver biopsy     Dr. Carlean Purl  . s/p lumbar surgery    . trigger finger repair left thumb    . TUBAL LIGATION    . UPPER GASTROINTESTINAL ENDOSCOPY  01/03/2003    reports that she quit smoking about 15 years ago. She has never used smokeless tobacco. She reports that she drinks alcohol. She reports that she does not use drugs. family history includes Alcohol abuse in her other; Heart disease in her father; Hyperlipidemia in her other; Hypertension in her  other. Allergies  Allergen Reactions  . Codeine Nausea Only   Current Outpatient Prescriptions on File Prior to Visit  Medication Sig Dispense Refill  . ALPRAZolam (XANAX) 0.25 MG tablet Take 1 tablet (0.25 mg total) by mouth 2 (two) times daily as needed for anxiety. 60 tablet 5  . aspirin 81 MG tablet Take 81 mg by mouth daily.     . Cholecalciferol (VITAMIN D3) 1000 UNITS CAPS Take 5,000 Units by mouth daily.     Marland Kitchen ibuprofen (ADVIL,MOTRIN) 200 MG tablet Take 400 mg by mouth daily.    . meclizine (ANTIVERT) 12.5 MG tablet Take 1 tablet (12.5 mg total) by mouth 3 (three) times daily as  needed. 30 tablet 2  . metoprolol succinate (TOPROL-XL) 50 MG 24 hr tablet Take 1 tablet (50 mg total) by mouth daily. Take with or immediately following a meal. 90 tablet 3  . polyethylene glycol (MIRALAX / GLYCOLAX) packet Take 17 g by mouth daily as needed. For constipation    . potassium chloride (KLOR-CON 10) 10 MEQ tablet Take 1 tablet (10 mEq total) by mouth daily. 90 tablet 3  . rosuvastatin (CRESTOR) 20 MG tablet Take 1 tablet (20 mg total) by mouth daily. 90 tablet 3  . triamterene-hydrochlorothiazide (MAXZIDE-25) 37.5-25 MG per tablet Take 1 tablet by mouth daily. 90 tablet 3  . venlafaxine XR (EFFEXOR-XR) 150 MG 24 hr capsule Take 1 capsule (150 mg total) by mouth daily with breakfast. 90 capsule 3   No current facility-administered medications on file prior to visit.    Review of Systems Constitutional: Negative for increased diaphoresis, or other activity, appetite or siginficant weight change other than noted HENT: Negative for worsening hearing loss, ear pain, facial swelling, mouth sores and neck stiffness.   Eyes: Negative for other worsening pain, redness or visual disturbance.  Respiratory: Negative for choking or stridor Cardiovascular: Negative for other chest pain and palpitations.  Gastrointestinal: Negative for worsening diarrhea, blood in stool, or abdominal distention Genitourinary: Negative for hematuria, flank pain or change in urine volume.  Musculoskeletal: Negative for myalgias or other joint complaints.  Skin: Negative for other color change and wound or drainage.  Neurological: Negative for syncope and numbness. other than noted Hematological: Negative for adenopathy. or other swelling Psychiatric/Behavioral: Negative for hallucinations, SI, self-injury, decreased concentration or other worsening agitation.      Objective:   Physical Exam VS noted,  Constitutional: Pt is oriented to person, place, and time. Appears well-developed and well-nourished, in no  significant distress Head: Normocephalic and atraumatic  Eyes: Conjunctivae and EOM are normal. Pupils are equal, round, and reactive to light Right Ear: External ear normal.  Left Ear: External ear normal Nose: Nose normal.  Mouth/Throat: Oropharynx is clear and moist  Bilat tm's with mild erythema.  Max sinus areas non tender.  Pharynx with mild erythema, no exudate Neck: Normal range of motion. Neck supple. No JVD present. No tracheal deviation present or significant neck LA or mass Cardiovascular: Normal rate, regular rhythm, normal heart sounds and intact distal pulses.   Pulmonary/Chest: Effort normal and breath sounds without rales or wheezing  Abdominal: Soft. Bowel sounds are normal. NT. No HSM  Musculoskeletal: Normal range of motion. Exhibits no edema Lymphadenopathy: Has no cervical adenopathy.  Neurological: Pt is alert and oriented to person, place, and time. Pt has normal reflexes. No cranial nerve deficit. Motor grossly intact Skin: Skin is warm and dry. No rash noted or new ulcers Psychiatric:  Has normal mood and affect.  Behavior is normal.      Assessment & Plan:

## 2016-08-12 NOTE — Assessment & Plan Note (Signed)
stable overall by history and exam, recent data reviewed with pt, and pt to continue medical treatment as before,  to f/u any worsening symptoms or concerns Lab Results  Component Value Date   LDLCALC 101 (H) 08/07/2014

## 2016-08-12 NOTE — Assessment & Plan Note (Signed)
stable overall by history and exam, recent data reviewed with pt, and pt to continue medical treatment as before,  to f/u any worsening symptoms or concerns Lab Results  Component Value Date   WBC 7.3 08/12/2016   HGB 13.6 08/12/2016   HCT 41.2 08/12/2016   PLT 310.0 08/12/2016   GLUCOSE 90 08/12/2016   CHOL 211 (H) 08/12/2016   TRIG 203.0 (H) 08/12/2016   HDL 60.00 08/12/2016   LDLDIRECT 116.0 08/12/2016   LDLCALC 101 (H) 08/07/2014   ALT 22 08/12/2016   AST 34 08/12/2016   NA 133 (L) 08/12/2016   K 3.8 08/12/2016   CL 96 08/12/2016   CREATININE 1.16 08/12/2016   BUN 13 08/12/2016   CO2 29 08/12/2016   TSH 6.02 (H) 08/12/2016   INR 1.0 11/10/2008

## 2016-08-12 NOTE — Progress Notes (Signed)
Pre visit review using our clinic review tool, if applicable. No additional management support is needed unless otherwise documented below in the visit note. 

## 2016-11-18 ENCOUNTER — Other Ambulatory Visit: Payer: Self-pay | Admitting: Internal Medicine

## 2016-11-18 DIAGNOSIS — N63 Unspecified lump in unspecified breast: Secondary | ICD-10-CM

## 2016-12-15 ENCOUNTER — Other Ambulatory Visit: Payer: Medicare Other

## 2016-12-22 ENCOUNTER — Other Ambulatory Visit: Payer: Medicare Other

## 2016-12-28 ENCOUNTER — Ambulatory Visit
Admission: RE | Admit: 2016-12-28 | Discharge: 2016-12-28 | Disposition: A | Discharge status: Home / Self Care | Payer: Medicare Other | Source: Ambulatory Visit | Attending: Internal Medicine | Admitting: Internal Medicine

## 2016-12-28 DIAGNOSIS — N63 Unspecified lump in unspecified breast: Secondary | ICD-10-CM

## 2016-12-28 DIAGNOSIS — N6310 Unspecified lump in the right breast, unspecified quadrant: Secondary | ICD-10-CM | POA: Diagnosis not present

## 2017-02-28 DIAGNOSIS — M545 Low back pain: Secondary | ICD-10-CM | POA: Diagnosis not present

## 2017-03-04 DIAGNOSIS — M545 Low back pain: Secondary | ICD-10-CM | POA: Diagnosis not present

## 2017-03-27 DIAGNOSIS — M545 Low back pain: Secondary | ICD-10-CM | POA: Diagnosis not present

## 2017-04-24 DIAGNOSIS — M47816 Spondylosis without myelopathy or radiculopathy, lumbar region: Secondary | ICD-10-CM | POA: Diagnosis not present

## 2017-04-24 DIAGNOSIS — M47817 Spondylosis without myelopathy or radiculopathy, lumbosacral region: Secondary | ICD-10-CM | POA: Diagnosis not present

## 2017-05-16 DIAGNOSIS — M47817 Spondylosis without myelopathy or radiculopathy, lumbosacral region: Secondary | ICD-10-CM | POA: Diagnosis not present

## 2017-05-16 DIAGNOSIS — M47816 Spondylosis without myelopathy or radiculopathy, lumbar region: Secondary | ICD-10-CM | POA: Diagnosis not present

## 2017-06-14 DIAGNOSIS — M47817 Spondylosis without myelopathy or radiculopathy, lumbosacral region: Secondary | ICD-10-CM | POA: Diagnosis not present

## 2017-06-14 DIAGNOSIS — Z6841 Body Mass Index (BMI) 40.0 and over, adult: Secondary | ICD-10-CM | POA: Diagnosis not present

## 2017-06-14 DIAGNOSIS — M47816 Spondylosis without myelopathy or radiculopathy, lumbar region: Secondary | ICD-10-CM | POA: Diagnosis not present

## 2017-06-14 DIAGNOSIS — M545 Low back pain: Secondary | ICD-10-CM | POA: Diagnosis not present

## 2017-07-05 ENCOUNTER — Other Ambulatory Visit: Payer: Self-pay | Admitting: Internal Medicine

## 2017-07-05 ENCOUNTER — Other Ambulatory Visit (INDEPENDENT_AMBULATORY_CARE_PROVIDER_SITE_OTHER): Payer: Medicare Other

## 2017-07-05 ENCOUNTER — Ambulatory Visit (INDEPENDENT_AMBULATORY_CARE_PROVIDER_SITE_OTHER)
Admission: RE | Admit: 2017-07-05 | Discharge: 2017-07-05 | Disposition: A | Discharge status: Home / Self Care | Payer: Medicare Other | Source: Ambulatory Visit | Attending: Internal Medicine | Admitting: Internal Medicine

## 2017-07-05 ENCOUNTER — Encounter: Payer: Self-pay | Admitting: Internal Medicine

## 2017-07-05 ENCOUNTER — Ambulatory Visit (INDEPENDENT_AMBULATORY_CARE_PROVIDER_SITE_OTHER): Payer: Medicare Other | Admitting: Internal Medicine

## 2017-07-05 VITALS — BP 116/84 | HR 81 | Ht 63.0 in | Wt 223.0 lb

## 2017-07-05 DIAGNOSIS — R062 Wheezing: Secondary | ICD-10-CM

## 2017-07-05 DIAGNOSIS — D500 Iron deficiency anemia secondary to blood loss (chronic): Secondary | ICD-10-CM | POA: Diagnosis not present

## 2017-07-05 DIAGNOSIS — E559 Vitamin D deficiency, unspecified: Secondary | ICD-10-CM | POA: Diagnosis not present

## 2017-07-05 DIAGNOSIS — E538 Deficiency of other specified B group vitamins: Secondary | ICD-10-CM | POA: Diagnosis not present

## 2017-07-05 DIAGNOSIS — R5383 Other fatigue: Secondary | ICD-10-CM | POA: Diagnosis not present

## 2017-07-05 DIAGNOSIS — R0609 Other forms of dyspnea: Secondary | ICD-10-CM

## 2017-07-05 DIAGNOSIS — E785 Hyperlipidemia, unspecified: Secondary | ICD-10-CM

## 2017-07-05 DIAGNOSIS — R0602 Shortness of breath: Secondary | ICD-10-CM | POA: Diagnosis not present

## 2017-07-05 LAB — CBC WITH DIFFERENTIAL/PLATELET
BASOS PCT: 0.9 % (ref 0.0–3.0)
Basophils Absolute: 0.1 10*3/uL (ref 0.0–0.1)
EOS PCT: 3.4 % (ref 0.0–5.0)
Eosinophils Absolute: 0.2 10*3/uL (ref 0.0–0.7)
HEMATOCRIT: 41.2 % (ref 36.0–46.0)
Hemoglobin: 13.6 g/dL (ref 12.0–15.0)
LYMPHS PCT: 39.6 % (ref 12.0–46.0)
Lymphs Abs: 2.6 10*3/uL (ref 0.7–4.0)
MCHC: 32.9 g/dL (ref 30.0–36.0)
MCV: 88.4 fl (ref 78.0–100.0)
MONOS PCT: 8.8 % (ref 3.0–12.0)
Monocytes Absolute: 0.6 10*3/uL (ref 0.1–1.0)
NEUTROS ABS: 3.1 10*3/uL (ref 1.4–7.7)
Neutrophils Relative %: 47.3 % (ref 43.0–77.0)
PLATELETS: 310 10*3/uL (ref 150.0–400.0)
RBC: 4.66 Mil/uL (ref 3.87–5.11)
RDW: 16.6 % — AB (ref 11.5–15.5)
WBC: 6.5 10*3/uL (ref 4.0–10.5)

## 2017-07-05 LAB — HEPATIC FUNCTION PANEL
ALT: 17 U/L (ref 0–35)
AST: 24 U/L (ref 0–37)
Albumin: 4.1 g/dL (ref 3.5–5.2)
Alkaline Phosphatase: 79 U/L (ref 39–117)
BILIRUBIN DIRECT: 0.1 mg/dL (ref 0.0–0.3)
BILIRUBIN TOTAL: 0.5 mg/dL (ref 0.2–1.2)
Total Protein: 7.4 g/dL (ref 6.0–8.3)

## 2017-07-05 LAB — LIPID PANEL
CHOL/HDL RATIO: 3
Cholesterol: 196 mg/dL (ref 0–200)
HDL: 63.2 mg/dL (ref >39.00)
LDL CALC: 95 mg/dL (ref 0–99)
NONHDL: 132.7
Triglycerides: 188 mg/dL — ABNORMAL HIGH (ref 0.0–149.0)
VLDL: 37.6 mg/dL (ref 0.0–40.0)

## 2017-07-05 LAB — TSH: TSH: 3.2 u[IU]/mL (ref 0.35–4.50)

## 2017-07-05 LAB — VITAMIN D 25 HYDROXY (VIT D DEFICIENCY, FRACTURES): VITD: 33.57 ng/mL (ref 30.00–100.00)

## 2017-07-05 LAB — URINALYSIS, ROUTINE W REFLEX MICROSCOPIC
BILIRUBIN URINE: NEGATIVE
HGB URINE DIPSTICK: NEGATIVE
NITRITE: NEGATIVE
Specific Gravity, Urine: 1.01 (ref 1.000–1.030)
TOTAL PROTEIN, URINE-UPE24: NEGATIVE
URINE GLUCOSE: NEGATIVE
UROBILINOGEN UA: 1 (ref 0.0–1.0)
pH: 7.5 (ref 5.0–8.0)

## 2017-07-05 LAB — IBC PANEL
Iron: 48 ug/dL (ref 42–145)
Saturation Ratios: 8.9 % — ABNORMAL LOW (ref 20.0–50.0)
Transferrin: 387 mg/dL — ABNORMAL HIGH (ref 212.0–360.0)

## 2017-07-05 LAB — BASIC METABOLIC PANEL
BUN: 13 mg/dL (ref 6–23)
CALCIUM: 10.3 mg/dL (ref 8.4–10.5)
CHLORIDE: 95 meq/L — AB (ref 96–112)
CO2: 30 mEq/L (ref 19–32)
Creatinine, Ser: 1.06 mg/dL (ref 0.40–1.20)
GFR: 54.96 mL/min — AB (ref >60.00)
Glucose, Bld: 98 mg/dL (ref 70–99)
Potassium: 3.4 mEq/L — ABNORMAL LOW (ref 3.5–5.1)
Sodium: 132 mEq/L — ABNORMAL LOW (ref 135–145)

## 2017-07-05 LAB — VITAMIN B12

## 2017-07-05 LAB — BRAIN NATRIURETIC PEPTIDE: Pro B Natriuretic peptide (BNP): 34 pg/mL (ref 0.0–100.0)

## 2017-07-05 LAB — SEDIMENTATION RATE: Sed Rate: 24 mm/hr (ref 0–30)

## 2017-07-05 MED ORDER — PREDNISONE 10 MG PO TABS: ORAL_TABLET | ORAL | 0 refills | Status: DC

## 2017-07-05 MED ORDER — CEPHALEXIN 500 MG PO CAPS: 500.0000 mg | ORAL_CAPSULE | Freq: Three times a day (TID) | ORAL | 0 refills | Status: AC

## 2017-07-05 MED ORDER — ALBUTEROL SULFATE HFA 108 (90 BASE) MCG/ACT IN AERS: 2.0000 | INHALATION_SPRAY | Freq: Four times a day (QID) | RESPIRATORY_TRACT | 2 refills | Status: DC | PRN

## 2017-07-05 NOTE — Patient Instructions (Signed)
Your EKG was OK today  Please take all new medication as prescribed - the prednisone, and the inhaler as needed for shortness of breath  Please continue all other medications as before, and refills have been done if requested.  Please have the pharmacy call with any other refills you may need.  Please continue your efforts at being more active, low cholesterol diet, and weight control.  Please keep your appointments with your specialists as you may have planned  You will be contacted regarding the referral for: Echocardiogram  Please go to the XRAY Department in the Basement (go straight as you get off the elevator) for the x-ray testing  Please go to the LAB in the Basement (turn left off the elevator) for the tests to be done today  You will be contacted by phone if any changes need to be made immediately.  Otherwise, you will receive a letter about your results with an explanation, but please check with MyChart first.  Please return in 3 months, or sooner if needed

## 2017-07-05 NOTE — Progress Notes (Signed)
Subjective:    Patient ID: Elizabeth Buckley, female    DOB: Sep 26, 1951, 66 y.o.   MRN: [id number]  HPI  Here to f/u, has seen Dr Ronnald Ramp (NS) with recent MRI finding of DJD l5, pain improving, but now with 3 days onset URI symptoms that has "gone to the chest" now with swelling under the jaw with pain, prod cough and worsening mild wheeze, DOE, feels off balance and fatigue.  Denies high fever, chills and Pt denies chest pain, orthopnea, PND, increased LE swelling, palpitations, or syncope.  Pt denies new neurological symptoms such as new headache, or facial or extremity weakness or numbness   Pt denies polydipsia, polyuria Past Medical History:  Diagnosis Date  . Anemia   . ANXIETY   . BACK PAIN   . Cataract   . CONSTIPATION, CHRONIC   . DEPRESSION   . GERD (gastroesophageal reflux disease)   . HYPERLIPIDEMIA   . HYPERTENSION   . NASH (NON-ALCOHOLIC STEATOHEPATITIS)   . Obesity, unspecified   . OSTEOPENIA   . Pneumonia    x3   Past Surgical History:  Procedure Laterality Date  . ABDOMINAL HYSTERECTOMY    . APPENDECTOMY    . BLADDER SUSPENSION  01/18/2012   Procedure: TRANSVAGINAL TAPE (TVT) PROCEDURE;  Surgeon: Daria Pastures, MD;  Location: [facility];  Service: Gynecology;  Laterality: N/A;  . CATARACT EXTRACTION     both eyes  . cataract surgery     left and right eye  . CHOLECYSTECTOMY    . COLONOSCOPY W/ BIOPSIES AND POLYPECTOMY  01/03/2003   submucosal lesion  . CYSTOCELE REPAIR  01/18/2012   Procedure: ANTERIOR REPAIR (CYSTOCELE);  Surgeon: Daria Pastures, MD;  Location: [facility];  Service: Gynecology;  Laterality: N/A;  . CYSTOSCOPY  01/18/2012   Procedure: CYSTOSCOPY;  Surgeon: Daria Pastures, MD;  Location: [facility];  Service: Gynecology;  Laterality: N/A;  . ELECTROCARDIOGRAM  07/27/2006  . s/p liver biopsy     Dr. Carlean Purl  . s/p lumbar surgery    . trigger finger repair left thumb    . TUBAL LIGATION    . UPPER GASTROINTESTINAL ENDOSCOPY  01/03/2003    reports  that she quit smoking about 16 years ago. She has never used smokeless tobacco. She reports that she drinks alcohol. She reports that she does not use drugs. family history includes Alcohol abuse in her other; Heart disease in her father; Hyperlipidemia in her other; Hypertension in her other. Allergies  Allergen Reactions  . Codeine Nausea Only   Current Outpatient Prescriptions on File Prior to Visit  Medication Sig Dispense Refill  . ALPRAZolam (XANAX) 0.25 MG tablet Take 1 tablet (0.25 mg total) by mouth 2 (two) times daily as needed for anxiety. 60 tablet 5  . aspirin 81 MG tablet Take 81 mg by mouth daily.     . Cholecalciferol (VITAMIN D3) 1000 UNITS CAPS Take 5,000 Units by mouth daily.     Marland Kitchen ibuprofen (ADVIL,MOTRIN) 200 MG tablet Take 400 mg by mouth daily.    Marland Kitchen levothyroxine (SYNTHROID, LEVOTHROID) 25 MCG tablet Take 1 tablet (25 mcg total) by mouth daily before breakfast. 90 tablet 3  . meclizine (ANTIVERT) 12.5 MG tablet Take 1 tablet (12.5 mg total) by mouth 3 (three) times daily as needed. 30 tablet 2  . metoprolol succinate (TOPROL-XL) 50 MG 24 hr tablet Take 1 tablet (50 mg total) by mouth daily. Take with or immediately following a meal. 90 tablet 3  .  polyethylene glycol (MIRALAX / GLYCOLAX) packet Take 17 g by mouth daily as needed. For constipation    . potassium chloride (KLOR-CON 10) 10 MEQ tablet Take 1 tablet (10 mEq total) by mouth daily. 90 tablet 3  . rosuvastatin (CRESTOR) 20 MG tablet Take 1 tablet (20 mg total) by mouth daily. 90 tablet 3  . triamterene-hydrochlorothiazide (MAXZIDE-25) 37.5-25 MG tablet Take 1 tablet by mouth daily. 90 tablet 3  . venlafaxine XR (EFFEXOR-XR) 150 MG 24 hr capsule Take 1 capsule (150 mg total) by mouth daily with breakfast. 90 capsule 3   No current facility-administered medications on file prior to visit.    Review of Systems  Constitutional: Negative for other unusual diaphoresis or sweats HENT: Negative for ear discharge or  swelling Eyes: Negative for other worsening visual disturbances Respiratory: Negative for stridor or other swelling  Gastrointestinal: Negative for worsening distension or other blood Genitourinary: Negative for retention or other urinary change Musculoskeletal: Negative for other MSK pain or swelling Skin: Negative for color change or other new lesions Neurological: Negative for worsening tremors and other numbness  Psychiatric/Behavioral: Negative for worsening agitation or other fatigue All other system neg per pt    Objective:   Physical Exam BP 116/84   Pulse 81   Ht 5\' 3"  (1.6 m)   Wt 223 lb (101.2 kg)   SpO2 98%   BMI 39.50 kg/m  VS noted, mild ill Constitutional: Pt appears in NAD HENT: Head: NCAT.  Right Ear: External ear normal.  Left Ear: External ear normal.  Eyes: . Pupils are equal, round, and reactive to light. Conjunctivae and EOM are normal Nose: without d/c or deformity Bilat tm's with mild erythema.  Max sinus areas non tender.  Pharynx with mild erythema, no exudate Neck: Neck supple. Gross normal ROM with bilat tender small submandibular LA Cardiovascular: Normal rate and regular rhythm.   Pulmonary/Chest: Effort normal and breath sounds decreased without rales but with few scattered mild insp wheezing.  Neurological: Pt is alert. At baseline orientation, motor grossly intact Skin: Skin is warm. No rashes, other new lesions, no LE edema Psychiatric: Pt behavior is normal without agitation   ECG today I have personally interpreted Sinus  Rhythm - WNL    Assessment & Plan:

## 2017-07-06 ENCOUNTER — Telehealth: Payer: Self-pay

## 2017-07-06 NOTE — Telephone Encounter (Signed)
Called pt, LVM.   

## 2017-07-06 NOTE — Telephone Encounter (Signed)
-----   Message from Biagio Borg, MD sent at 07/05/2017  7:33 PM EDT ----- Left message on MyChart, pt to cont same tx except  The test results show that your current treatment is OK, except the urine testing is consistent with possible infection.  Given your symptoms, we should treat with an antibiotic.  I will send to your pharmacy and you should hear from the office as well.    Mackena Plummer to please inform pt, I will do rx

## 2017-07-06 NOTE — Telephone Encounter (Signed)
Pt given results  

## 2017-07-08 NOTE — Assessment & Plan Note (Signed)
Mild to mod, for depomedrol IM 80, predpac asd, to f/u any worsening symptoms or concerns 

## 2017-07-08 NOTE — Assessment & Plan Note (Signed)
For low chol diet, cont statin, for lab f/u with goal < 100

## 2017-07-08 NOTE — Assessment & Plan Note (Signed)
Mild to mod, for lab eval as above, suspect related to current respiratory illness,  to f/u any worsening symptoms or concerns

## 2017-07-08 NOTE — Assessment & Plan Note (Signed)
Mild to mod, for depomedrol IM 80, predpac asd,  Has no CP, ecg reviewed, also for labs as ordered, cxr, and echo

## 2017-07-08 NOTE — Assessment & Plan Note (Signed)
Also for f/u with labs given the dyspnea

## 2017-07-11 ENCOUNTER — Telehealth (HOSPITAL_COMMUNITY): Payer: Self-pay | Admitting: Internal Medicine

## 2017-07-12 NOTE — Telephone Encounter (Signed)
User: Elizabeth Buckley A Date/time: 07/11/17 2:35 PM  Comment: Called pt and lmsg for her to CB to get scheduled for echo.   Context:  Outcome: Left Message  Phone number: [phone] Phone Type: Home Phone  Comm. type: Telephone Call type: Outgoing  Contact: Buckley, Elizabeth N Relation to patient: Self

## 2017-07-17 ENCOUNTER — Ambulatory Visit (HOSPITAL_COMMUNITY): Payer: Medicare Other | Attending: Internal Medicine

## 2017-07-17 ENCOUNTER — Other Ambulatory Visit: Payer: Self-pay

## 2017-07-17 DIAGNOSIS — E785 Hyperlipidemia, unspecified: Secondary | ICD-10-CM | POA: Diagnosis not present

## 2017-07-17 DIAGNOSIS — R0609 Other forms of dyspnea: Secondary | ICD-10-CM | POA: Insufficient documentation

## 2017-07-18 ENCOUNTER — Telehealth: Payer: Self-pay | Admitting: Internal Medicine

## 2017-07-18 NOTE — Telephone Encounter (Signed)
Pt called in and would like someone to call her back about her echo results?

## 2017-07-18 NOTE — Telephone Encounter (Signed)
Please advise 

## 2017-07-18 NOTE — Progress Notes (Unsigned)
normj  

## 2017-07-18 NOTE — Telephone Encounter (Signed)
Forwarding msg to MD assistant to contact pt concerning results...Elizabeth Buckley

## 2017-07-19 NOTE — Telephone Encounter (Signed)
Informed pt and she expressed understanding.  

## 2017-07-19 NOTE — Telephone Encounter (Signed)
Echo was normal, and did not have any significant abnormalities.,  No further evaluation or tx based on this is needed

## 2017-08-15 ENCOUNTER — Ambulatory Visit: Payer: Medicare Other | Admitting: Internal Medicine

## 2017-08-24 DIAGNOSIS — M47816 Spondylosis without myelopathy or radiculopathy, lumbar region: Secondary | ICD-10-CM | POA: Diagnosis not present

## 2017-08-31 ENCOUNTER — Other Ambulatory Visit: Payer: Self-pay | Admitting: Internal Medicine

## 2017-09-22 ENCOUNTER — Telehealth: Payer: Self-pay | Admitting: Internal Medicine

## 2017-09-22 MED ORDER — VENLAFAXINE HCL ER 150 MG PO CP24: 150.0000 mg | ORAL_CAPSULE | Freq: Every day | ORAL | 0 refills | Status: DC

## 2017-09-22 MED ORDER — TRIAMTERENE-HCTZ 37.5-25 MG PO TABS: 1.0000 | ORAL_TABLET | Freq: Every day | ORAL | 0 refills | Status: DC

## 2017-09-22 MED ORDER — POTASSIUM CHLORIDE ER 10 MEQ PO TBCR: 10.0000 meq | EXTENDED_RELEASE_TABLET | Freq: Every day | ORAL | 0 refills | Status: DC

## 2017-09-22 MED ORDER — ROSUVASTATIN CALCIUM 20 MG PO TABS: 20.0000 mg | ORAL_TABLET | Freq: Every day | ORAL | 0 refills | Status: DC

## 2017-09-22 MED ORDER — METOPROLOL SUCCINATE ER 50 MG PO TB24: 50.0000 mg | ORAL_TABLET | Freq: Every day | ORAL | 0 refills | Status: DC

## 2017-09-22 NOTE — Telephone Encounter (Signed)
Per chart no electronic script has came threw, but per office policy sent 30 day to local pharmacy until appt...Elizabeth Buckley

## 2017-09-22 NOTE — Telephone Encounter (Addendum)
Patient calling stating she is out of all of her medications.  States [facility] has sent request over but has not heard anything yet.  Please follow up with patient in regard. Patient has scheduled appt for wed the 10th with Dr. Jenny Reichmann.

## 2017-09-27 ENCOUNTER — Encounter: Payer: Self-pay | Admitting: Internal Medicine

## 2017-09-27 ENCOUNTER — Ambulatory Visit (INDEPENDENT_AMBULATORY_CARE_PROVIDER_SITE_OTHER): Payer: Medicare Other | Admitting: Internal Medicine

## 2017-09-27 VITALS — BP 104/76 | HR 98 | Temp 97.8°F | Ht 63.0 in | Wt 224.0 lb

## 2017-09-27 DIAGNOSIS — I10 Essential (primary) hypertension: Secondary | ICD-10-CM

## 2017-09-27 DIAGNOSIS — Z23 Encounter for immunization: Secondary | ICD-10-CM | POA: Diagnosis not present

## 2017-09-27 DIAGNOSIS — M47817 Spondylosis without myelopathy or radiculopathy, lumbosacral region: Secondary | ICD-10-CM | POA: Diagnosis not present

## 2017-09-27 DIAGNOSIS — E785 Hyperlipidemia, unspecified: Secondary | ICD-10-CM | POA: Diagnosis not present

## 2017-09-27 DIAGNOSIS — J019 Acute sinusitis, unspecified: Secondary | ICD-10-CM

## 2017-09-27 DIAGNOSIS — E039 Hypothyroidism, unspecified: Secondary | ICD-10-CM

## 2017-09-27 DIAGNOSIS — M47816 Spondylosis without myelopathy or radiculopathy, lumbar region: Secondary | ICD-10-CM | POA: Diagnosis not present

## 2017-09-27 DIAGNOSIS — Z6841 Body Mass Index (BMI) 40.0 and over, adult: Secondary | ICD-10-CM | POA: Diagnosis not present

## 2017-09-27 MED ORDER — MECLIZINE HCL 12.5 MG PO TABS
12.5000 mg | ORAL_TABLET | Freq: Three times a day (TID) | ORAL | 2 refills | Status: AC | PRN
Start: 2017-09-27 — End: ?

## 2017-09-27 MED ORDER — ALPRAZOLAM 0.25 MG PO TABS: 0.2500 mg | ORAL_TABLET | Freq: Two times a day (BID) | ORAL | 5 refills | Status: DC | PRN

## 2017-09-27 MED ORDER — POLYETHYLENE GLYCOL 3350 17 G PO PACK: 17.0000 g | PACK | Freq: Every day | ORAL | 6 refills | Status: AC | PRN

## 2017-09-27 MED ORDER — VENLAFAXINE HCL ER 150 MG PO CP24: 150.0000 mg | ORAL_CAPSULE | Freq: Every day | ORAL | 11 refills | Status: DC

## 2017-09-27 MED ORDER — POTASSIUM CHLORIDE ER 10 MEQ PO TBCR: 10.0000 meq | EXTENDED_RELEASE_TABLET | Freq: Every day | ORAL | 0 refills | Status: DC

## 2017-09-27 MED ORDER — ROSUVASTATIN CALCIUM 20 MG PO TABS: 20.0000 mg | ORAL_TABLET | Freq: Every day | ORAL | 3 refills | Status: DC

## 2017-09-27 MED ORDER — TRIAMTERENE-HCTZ 37.5-25 MG PO TABS: 1.0000 | ORAL_TABLET | Freq: Every day | ORAL | 3 refills | Status: DC

## 2017-09-27 MED ORDER — LEVOTHYROXINE SODIUM 25 MCG PO TABS: ORAL_TABLET | ORAL | 3 refills | Status: DC

## 2017-09-27 MED ORDER — ZOSTER VAC RECOMB ADJUVANTED 50 MCG/0.5ML IM SUSR: 0.5000 mL | Freq: Once | INTRAMUSCULAR | 1 refills | Status: AC

## 2017-09-27 MED ORDER — ALBUTEROL SULFATE HFA 108 (90 BASE) MCG/ACT IN AERS: 2.0000 | INHALATION_SPRAY | Freq: Four times a day (QID) | RESPIRATORY_TRACT | 2 refills | Status: DC | PRN

## 2017-09-27 MED ORDER — METOPROLOL SUCCINATE ER 50 MG PO TB24: 50.0000 mg | ORAL_TABLET | Freq: Every day | ORAL | 3 refills | Status: DC

## 2017-09-27 NOTE — Patient Instructions (Addendum)
You had the flu shot today  We sent the new Shingles shot to the pharmacy  Please continue all other medications as before, and refills have been done if requested.  Please have the pharmacy call with any other refills you may need.  Please continue your efforts at being more active, low cholesterol diet, and weight control.  You are otherwise up to date with prevention measures today.  Please keep your appointments with your specialists as you may have planned  Please return in 6 months, or sooner if needed

## 2017-09-27 NOTE — Assessment & Plan Note (Signed)
stable overall by history and exam, recent data reviewed with pt, and pt to continue medical treatment as before,  to f/u any worsening symptoms or concerns Lab Results  Component Value Date   TSH 3.20 07/05/2017

## 2017-09-27 NOTE — Progress Notes (Signed)
Subjective:    Patient ID: Elizabeth Buckley, female    DOB: 1951/09/05, 66 y.o.   MRN: [id number]  HPI  Here for yearly f/u;  Overall doing ok;  Pt denies Chest pain, worsening SOB, DOE, wheezing, orthopnea, PND, worsening LE edema, palpitations, dizziness or syncope.  Pt denies neurological change such as new headache, facial or extremity weakness.  Pt denies polydipsia, polyuria, or low sugar symptoms. Pt states overall good compliance with treatment and medications, good tolerability, and has been trying to follow appropriate diet.  Pt denies worsening depressive symptoms, suicidal ideation or panic. No fever, night sweats, wt loss, loss of appetite, or other constitutional symptoms.  Pt states good ability with ADL's, has low fall risk, home safety reviewed and adequate, no other significant changes in hearing or vision, and and not active with exercise but plans to start pilades soon.  Husband had low blood sugar last wk with EMS called.  No other interval hx Past Medical History:  Diagnosis Date  . Anemia   . ANXIETY   . BACK PAIN   . Cataract   . CONSTIPATION, CHRONIC   . DEPRESSION   . GERD (gastroesophageal reflux disease)   . HYPERLIPIDEMIA   . HYPERTENSION   . NASH (NON-ALCOHOLIC STEATOHEPATITIS)   . Obesity, unspecified   . OSTEOPENIA   . Pneumonia    x3   Past Surgical History:  Procedure Laterality Date  . ABDOMINAL HYSTERECTOMY    . APPENDECTOMY    . BLADDER SUSPENSION  01/18/2012   Procedure: TRANSVAGINAL TAPE (TVT) PROCEDURE;  Surgeon: Daria Pastures, MD;  Location: [facility];  Service: Gynecology;  Laterality: N/A;  . CATARACT EXTRACTION     both eyes  . cataract surgery     left and right eye  . CHOLECYSTECTOMY    . COLONOSCOPY W/ BIOPSIES AND POLYPECTOMY  01/03/2003   submucosal lesion  . CYSTOCELE REPAIR  01/18/2012   Procedure: ANTERIOR REPAIR (CYSTOCELE);  Surgeon: Daria Pastures, MD;  Location: [facility];  Service: Gynecology;  Laterality: N/A;  .  CYSTOSCOPY  01/18/2012   Procedure: CYSTOSCOPY;  Surgeon: Daria Pastures, MD;  Location: [facility];  Service: Gynecology;  Laterality: N/A;  . ELECTROCARDIOGRAM  07/27/2006  . s/p liver biopsy     Dr. Carlean Purl  . s/p lumbar surgery    . trigger finger repair left thumb    . TUBAL LIGATION    . UPPER GASTROINTESTINAL ENDOSCOPY  01/03/2003    reports that she quit smoking about 16 years ago. She has never used smokeless tobacco. She reports that she drinks alcohol. She reports that she does not use drugs. family history includes Alcohol abuse in her other; Heart disease in her father; Hyperlipidemia in her other; Hypertension in her other. Allergies  Allergen Reactions  . Codeine Nausea Only   Current Outpatient Prescriptions on File Prior to Visit  Medication Sig Dispense Refill  . aspirin 81 MG tablet Take 81 mg by mouth daily.     . Cholecalciferol (VITAMIN D3) 1000 UNITS CAPS Take 5,000 Units by mouth daily.     Marland Kitchen ibuprofen (ADVIL,MOTRIN) 200 MG tablet Take 400 mg by mouth daily.     No current facility-administered medications on file prior to visit.    Review of Systems  Constitutional: Negative for other unusual diaphoresis or sweats HENT: Negative for ear discharge or swelling Eyes: Negative for other worsening visual disturbances Respiratory: Negative for stridor or other swelling  Gastrointestinal: Negative for worsening  distension or other blood Genitourinary: Negative for retention or other urinary change Musculoskeletal: Negative for other MSK pain or swelling Skin: Negative for color change or other new lesions Neurological: Negative for worsening tremors and other numbness  Psychiatric/Behavioral: Negative for worsening agitation or other fatigue All other system neg per pt    Objective:   Physical Exam BP 104/76   Pulse 98   Temp 97.8 F (36.6 C) (Oral)   Ht 5\' 3"  (1.6 m)   Wt 224 lb (101.6 kg)   SpO2 100%   BMI 39.68 kg/m  VS noted,  Constitutional: Pt is  oriented to person, place, and time. Appears well-developed and well-nourished, in no significant distress and comfortable Head: Normocephalic and atraumatic  Eyes: Conjunctivae and EOM are normal. Pupils are equal, round, and reactive to light Right Ear: External ear normal without discharge Left Ear: External ear normal without discharge Nose: Nose without discharge or deformity Mouth/Throat: Oropharynx is without other ulcerations and moist  Neck: Normal range of motion. Neck supple. No JVD present. No tracheal deviation present or significant neck LA or mass Cardiovascular: Normal rate, regular rhythm, normal heart sounds and intact distal pulses.   Pulmonary/Chest: WOB normal and breath sounds without rales or wheezing  Abdominal: Soft. Bowel sounds are normal. NT. No HSM  Musculoskeletal: Normal range of motion. Exhibits no edema Lymphadenopathy: Has no other cervical adenopathy.  Neurological: Pt is alert and oriented to person, place, and time. Pt has normal reflexes. No cranial nerve deficit. Motor grossly intact, Gait intact Skin: Skin is warm and dry. No rash noted or new ulcerations Psychiatric:  Has mild nervous mood and affect. Behavior is normal without agitation No other exam findings Lab Results  Component Value Date   WBC 6.5 07/05/2017   HGB 13.6 07/05/2017   HCT 41.2 07/05/2017   PLT 310.0 07/05/2017   GLUCOSE 98 07/05/2017   CHOL 196 07/05/2017   TRIG 188.0 (H) 07/05/2017   HDL 63.20 07/05/2017   LDLDIRECT 116.0 08/12/2016   LDLCALC 95 07/05/2017   ALT 17 07/05/2017   AST 24 07/05/2017   NA 132 (L) 07/05/2017   K 3.4 (L) 07/05/2017   CL 95 (L) 07/05/2017   CREATININE 1.06 07/05/2017   BUN 13 07/05/2017   CO2 30 07/05/2017   TSH 3.20 07/05/2017   INR 1.0 11/10/2008       Assessment & Plan:

## 2017-09-27 NOTE — Assessment & Plan Note (Signed)
stable overall by history and exam, recent data reviewed with pt, and pt to continue medical treatment as before,  to f/u any worsening symptoms or concerns  BP Readings from Last 3 Encounters:  09/27/17 104/76  07/05/17 116/84  08/12/16 124/82

## 2017-09-27 NOTE — Assessment & Plan Note (Signed)
stable overall by history and exam, recent data reviewed with pt, and pt to continue medical treatment as before,  to f/u any worsening symptoms or concerns Lab Results  Component Value Date   [place] 95 07/05/2017

## 2017-09-28 ENCOUNTER — Ambulatory Visit: Payer: Medicare Other | Admitting: Family Medicine

## 2017-09-28 ENCOUNTER — Ambulatory Visit: Payer: Medicare Other | Admitting: Internal Medicine

## 2017-11-24 ENCOUNTER — Other Ambulatory Visit: Payer: Self-pay | Admitting: Internal Medicine

## 2017-11-24 DIAGNOSIS — Z1231 Encounter for screening mammogram for malignant neoplasm of breast: Secondary | ICD-10-CM

## 2017-12-22 ENCOUNTER — Other Ambulatory Visit: Payer: Self-pay

## 2017-12-22 MED ORDER — VENLAFAXINE HCL ER 150 MG PO CP24: 150.0000 mg | ORAL_CAPSULE | Freq: Every day | ORAL | 1 refills | Status: DC

## 2018-01-01 ENCOUNTER — Ambulatory Visit: Payer: Medicare Other

## 2018-01-05 ENCOUNTER — Ambulatory Visit
Admission: RE | Admit: 2018-01-05 | Discharge: 2018-01-05 | Disposition: A | Discharge status: Home / Self Care | Payer: Medicare Other | Source: Ambulatory Visit | Attending: Internal Medicine | Admitting: Internal Medicine

## 2018-01-05 DIAGNOSIS — Z1231 Encounter for screening mammogram for malignant neoplasm of breast: Secondary | ICD-10-CM | POA: Diagnosis not present

## 2018-03-28 ENCOUNTER — Other Ambulatory Visit (INDEPENDENT_AMBULATORY_CARE_PROVIDER_SITE_OTHER): Payer: PPO

## 2018-03-28 ENCOUNTER — Encounter: Payer: Self-pay | Admitting: Internal Medicine

## 2018-03-28 ENCOUNTER — Ambulatory Visit (INDEPENDENT_AMBULATORY_CARE_PROVIDER_SITE_OTHER): Payer: PPO | Admitting: Internal Medicine

## 2018-03-28 VITALS — BP 122/78 | HR 75 | Temp 97.8°F | Ht 63.0 in | Wt 224.0 lb

## 2018-03-28 DIAGNOSIS — Z0000 Encounter for general adult medical examination without abnormal findings: Secondary | ICD-10-CM

## 2018-03-28 DIAGNOSIS — I10 Essential (primary) hypertension: Secondary | ICD-10-CM | POA: Diagnosis not present

## 2018-03-28 DIAGNOSIS — Z0001 Encounter for general adult medical examination with abnormal findings: Secondary | ICD-10-CM | POA: Insufficient documentation

## 2018-03-28 LAB — URINALYSIS, ROUTINE W REFLEX MICROSCOPIC
BILIRUBIN URINE: NEGATIVE
HGB URINE DIPSTICK: NEGATIVE
KETONES UR: NEGATIVE
NITRITE: NEGATIVE
PH: 7.5 (ref 5.0–8.0)
RBC / HPF: NONE SEEN (ref 0–?)
Specific Gravity, Urine: <=1.005 — AB (ref 1.000–1.030)
Total Protein, Urine: NEGATIVE
UROBILINOGEN UA: 0.2 (ref 0.0–1.0)
Urine Glucose: NEGATIVE

## 2018-03-28 LAB — LIPID PANEL
CHOL/HDL RATIO: 4
CHOLESTEROL: 198 mg/dL (ref 0–200)
HDL: 55 mg/dL (ref >39.00)
NonHDL: 143.36
Triglycerides: 250 mg/dL — ABNORMAL HIGH (ref 0.0–149.0)
VLDL: 50 mg/dL — ABNORMAL HIGH (ref 0.0–40.0)

## 2018-03-28 LAB — BASIC METABOLIC PANEL
BUN: 14 mg/dL (ref 6–23)
CHLORIDE: 94 meq/L — AB (ref 96–112)
CO2: 30 mEq/L (ref 19–32)
CREATININE: 1.21 mg/dL — AB (ref 0.40–1.20)
Calcium: 10.3 mg/dL (ref 8.4–10.5)
GFR: 47.07 mL/min — ABNORMAL LOW (ref >60.00)
Glucose, Bld: 92 mg/dL (ref 70–99)
Potassium: 3.5 mEq/L (ref 3.5–5.1)
Sodium: 132 mEq/L — ABNORMAL LOW (ref 135–145)

## 2018-03-28 LAB — HEPATIC FUNCTION PANEL
ALT: 23 U/L (ref 0–35)
AST: 37 U/L (ref 0–37)
Albumin: 4.2 g/dL (ref 3.5–5.2)
Alkaline Phosphatase: 64 U/L (ref 39–117)
Bilirubin, Direct: 0.1 mg/dL (ref 0.0–0.3)
Total Bilirubin: 0.6 mg/dL (ref 0.2–1.2)
Total Protein: 7.6 g/dL (ref 6.0–8.3)

## 2018-03-28 LAB — CBC WITH DIFFERENTIAL/PLATELET
BASOS ABS: 0.1 10*3/uL (ref 0.0–0.1)
Basophils Relative: 1.1 % (ref 0.0–3.0)
EOS ABS: 0.3 10*3/uL (ref 0.0–0.7)
Eosinophils Relative: 5.7 % — ABNORMAL HIGH (ref 0.0–5.0)
HEMATOCRIT: 39.8 % (ref 36.0–46.0)
Hemoglobin: 13.5 g/dL (ref 12.0–15.0)
LYMPHS PCT: 37.9 % (ref 12.0–46.0)
Lymphs Abs: 2.3 10*3/uL (ref 0.7–4.0)
MCHC: 33.8 g/dL (ref 30.0–36.0)
MCV: 91 fl (ref 78.0–100.0)
Monocytes Absolute: 0.5 10*3/uL (ref 0.1–1.0)
Monocytes Relative: 7.5 % (ref 3.0–12.0)
NEUTROS ABS: 3 10*3/uL (ref 1.4–7.7)
Neutrophils Relative %: 47.8 % (ref 43.0–77.0)
PLATELETS: 257 10*3/uL (ref 150.0–400.0)
RBC: 4.38 Mil/uL (ref 3.87–5.11)
RDW: 15.7 % — ABNORMAL HIGH (ref 11.5–15.5)
WBC: 6.2 10*3/uL (ref 4.0–10.5)

## 2018-03-28 LAB — TSH: TSH: 6.65 u[IU]/mL — ABNORMAL HIGH (ref 0.35–4.50)

## 2018-03-28 LAB — LDL CHOLESTEROL, DIRECT: Direct LDL: 105 mg/dL

## 2018-03-28 MED ORDER — ZOSTER VAC RECOMB ADJUVANTED 50 MCG/0.5ML IM SUSR: 0.5000 mL | Freq: Once | INTRAMUSCULAR | 1 refills | Status: AC

## 2018-03-28 NOTE — Patient Instructions (Signed)

## 2018-03-28 NOTE — Progress Notes (Signed)
Subjective:    Patient ID: Elizabeth Buckley, female    DOB: 09/10/1951, 67 y.o.   MRN: [id number]  HPI  Here for wellness and f/u;  Overall doing ok;  Pt denies Chest pain, worsening SOB, DOE, wheezing, orthopnea, PND, worsening LE edema, palpitations, dizziness or syncope.  Pt denies neurological change such as new headache, facial or extremity weakness.  Pt denies polydipsia, polyuria, or low sugar symptoms. Pt states overall good compliance with treatment and medications, good tolerability, and has been trying to follow appropriate diet.  Pt denies worsening depressive symptoms, suicidal ideation or panic. No fever, night sweats, wt loss, loss of appetite, or other constitutional symptoms.  Pt states good ability with ADL's, has low fall risk, home safety reviewed and adequate, no other significant changes in hearing or vision, and only occasionally active with exercise.  No other interval hx or new complaint Wt Readings from Last 3 Encounters:  03/28/18 224 lb (101.6 kg)  09/27/17 224 lb (101.6 kg)  07/05/17 223 lb (101.2 kg)   Past Medical History:  Diagnosis Date  . Anemia   . ANXIETY   . BACK PAIN   . Cataract   . CONSTIPATION, CHRONIC   . DEPRESSION   . GERD (gastroesophageal reflux disease)   . HYPERLIPIDEMIA   . HYPERTENSION   . NASH (NON-ALCOHOLIC STEATOHEPATITIS)   . Obesity, unspecified   . OSTEOPENIA   . Pneumonia    x3   Past Surgical History:  Procedure Laterality Date  . ABDOMINAL HYSTERECTOMY    . APPENDECTOMY    . BLADDER SUSPENSION  01/18/2012   Procedure: TRANSVAGINAL TAPE (TVT) PROCEDURE;  Surgeon: Daria Pastures, MD;  Location: [facility];  Service: Gynecology;  Laterality: N/A;  . BREAST BIOPSY Right   . CATARACT EXTRACTION     both eyes  . cataract surgery     left and right eye  . CHOLECYSTECTOMY    . COLONOSCOPY W/ BIOPSIES AND POLYPECTOMY  01/03/2003   submucosal lesion  . CYSTOCELE REPAIR  01/18/2012   Procedure: ANTERIOR REPAIR (CYSTOCELE);   Surgeon: Daria Pastures, MD;  Location: [facility];  Service: Gynecology;  Laterality: N/A;  . CYSTOSCOPY  01/18/2012   Procedure: CYSTOSCOPY;  Surgeon: Daria Pastures, MD;  Location: [facility];  Service: Gynecology;  Laterality: N/A;  . ELECTROCARDIOGRAM  07/27/2006  . s/p liver biopsy     Dr. Carlean Purl  . s/p lumbar surgery    . trigger finger repair left thumb    . TUBAL LIGATION    . UPPER GASTROINTESTINAL ENDOSCOPY  01/03/2003    reports that she quit smoking about 17 years ago. She has never used smokeless tobacco. She reports that she drinks alcohol. She reports that she does not use drugs. family history includes Alcohol abuse in her other; Heart disease in her father; Hyperlipidemia in her other; Hypertension in her other. Allergies  Allergen Reactions  . Codeine Nausea Only   Current Outpatient Medications on File Prior to Visit  Medication Sig Dispense Refill  . albuterol (PROVENTIL HFA;VENTOLIN HFA) 108 (90 Base) MCG/ACT inhaler Inhale 2 puffs into the lungs every 6 (six) hours as needed for wheezing or shortness of breath. 1 Inhaler 2  . ALPRAZolam (XANAX) 0.25 MG tablet Take 1 tablet (0.25 mg total) by mouth 2 (two) times daily as needed for anxiety. 60 tablet 5  . aspirin 81 MG tablet Take 81 mg by mouth daily.     . Cholecalciferol (VITAMIN D3) 1000 UNITS  CAPS Take 5,000 Units by mouth daily.     Marland Kitchen ibuprofen (ADVIL,MOTRIN) 200 MG tablet Take 400 mg by mouth daily.    . meclizine (ANTIVERT) 12.5 MG tablet Take 1 tablet (12.5 mg total) by mouth 3 (three) times daily as needed. 30 tablet 2  . metoprolol succinate (TOPROL-XL) 50 MG 24 hr tablet Take 1 tablet (50 mg total) by mouth daily. Take with or immediately following a meal. 90 tablet 3  . polyethylene glycol (MIRALAX / GLYCOLAX) packet Take 17 g by mouth daily as needed. For constipation 14 each 6  . potassium chloride (KLOR-CON 10) 10 MEQ tablet Take 1 tablet (10 mEq total) by mouth daily. Must keep appt for future refills 30  tablet 0  . rosuvastatin (CRESTOR) 20 MG tablet Take 1 tablet (20 mg total) by mouth daily. 90 tablet 3  . triamterene-hydrochlorothiazide (MAXZIDE-25) 37.5-25 MG tablet Take 1 tablet by mouth daily. 90 tablet 3  . venlafaxine XR (EFFEXOR-XR) 150 MG 24 hr capsule Take 1 capsule (150 mg total) by mouth daily with breakfast. 90 capsule 1   No current facility-administered medications on file prior to visit.    Review of Systems Constitutional: Negative for other unusual diaphoresis, sweats, appetite or weight changes HENT: Negative for other worsening hearing loss, ear pain, facial swelling, mouth sores or neck stiffness.   Eyes: Negative for other worsening pain, redness or other visual disturbance.  Respiratory: Negative for other stridor or swelling Cardiovascular: Negative for other palpitations or other chest pain  Gastrointestinal: Negative for worsening diarrhea or loose stools, blood in stool, distention or other pain Genitourinary: Negative for hematuria, flank pain or other change in urine volume.  Musculoskeletal: Negative for myalgias or other joint swelling.  Skin: Negative for other color change, or other wound or worsening drainage.  Neurological: Negative for other syncope or numbness. Hematological: Negative for other adenopathy or swelling Psychiatric/Behavioral: Negative for hallucinations, other worsening agitation, SI, self-injury, or new decreased concentration \\All  other system neg per pt    Objective:   Physical Exam BP 122/78   Pulse 75   Temp 97.8 F (36.6 C) (Oral)   Ht 5\' 3"  (1.6 m)   Wt 224 lb (101.6 kg)   SpO2 98%   BMI 39.68 kg/m  VS noted,  Constitutional: Pt is oriented to person, place, and time. Appears well-developed and well-nourished, in no significant distress and comfortable Head: Normocephalic and atraumatic  Eyes: Conjunctivae and EOM are normal. Pupils are equal, round, and reactive to light Right Ear: External ear normal without  discharge Left Ear: External ear normal without discharge Nose: Nose without discharge or deformity Mouth/Throat: Oropharynx is without other ulcerations and moist  Neck: Normal range of motion. Neck supple. No JVD present. No tracheal deviation present or significant neck LA or mass Cardiovascular: Normal rate, regular rhythm, normal heart sounds and intact distal pulses.   Pulmonary/Chest: WOB normal and breath sounds without rales or wheezing  Abdominal: Soft. Bowel sounds are normal. NT. No HSM  Musculoskeletal: Normal range of motion. Exhibits no edema Lymphadenopathy: Has no other cervical adenopathy.  Neurological: Pt is alert and oriented to person, place, and time. Pt has normal reflexes. No cranial nerve deficit. Motor grossly intact, Gait intact Skin: Skin is warm and dry. No rash noted or new ulcerations Psychiatric:  Has normal mood and affect. Behavior is normal without agitation\]No other exam findings    Assessment & Plan:

## 2018-03-29 ENCOUNTER — Other Ambulatory Visit: Payer: Self-pay | Admitting: Internal Medicine

## 2018-03-29 ENCOUNTER — Telehealth: Payer: Self-pay

## 2018-03-29 MED ORDER — LEVOTHYROXINE SODIUM 50 MCG PO TABS: 50.0000 ug | ORAL_TABLET | Freq: Every day | ORAL | 3 refills | Status: DC

## 2018-03-29 NOTE — Telephone Encounter (Signed)
Called pt to discuss lab results, however, VM is full and can con receive any other msgs at this time.

## 2018-03-29 NOTE — Telephone Encounter (Signed)
-----   Message from Biagio Borg, MD sent at 03/29/2018 12:54 PM EDT ----- Left message on MyChart, pt to cont same tx except  The test results show that your current treatment is OK, except the Thyroid test indicates the need for slightly higher medication dose. We need to increase the thyroid medication from 25 to 50 mcg per day. I will send a new prescription, and you should hear from the office as well    Elizabeth Buckley to please inform pt, I will do rx

## 2018-03-31 NOTE — Assessment & Plan Note (Signed)

## 2018-03-31 NOTE — Assessment & Plan Note (Signed)
stable overall by history and exam, recent data reviewed with pt, and pt to continue medical treatment as before,  to f/u any worsening symptoms or concerns BP Readings from Last 3 Encounters:  03/28/18 122/78  09/27/17 104/76  07/05/17 116/84

## 2018-07-22 IMAGING — MG 2D DIGITAL SCREENING BILATERAL MAMMOGRAM WITH 3D TOMO WITH CAD
8 of 13 series · 8 of 29 positions shown · non-contrast
Comparison: Previous exam(s).

ACR Breast Density Category a: The breast tissue is almost entirely
fatty.

CLINICAL DATA: Screening.

EXAM:
2D DIGITAL SCREENING BILATERAL MAMMOGRAM WITH 3D TOMO WITH CAD

[R MLO (1 of 2)]
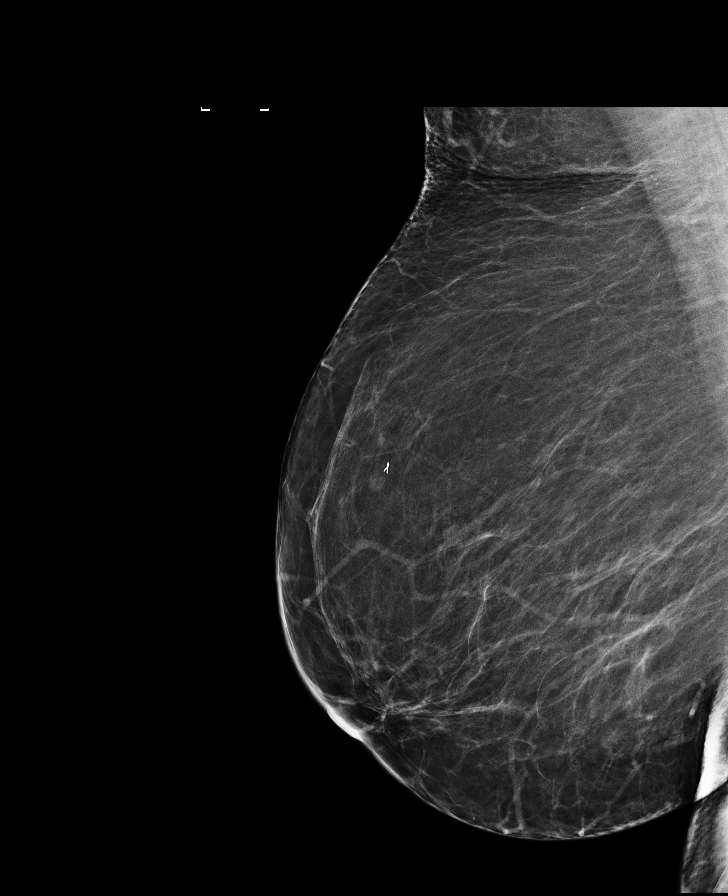

[L MLO synth-2D]
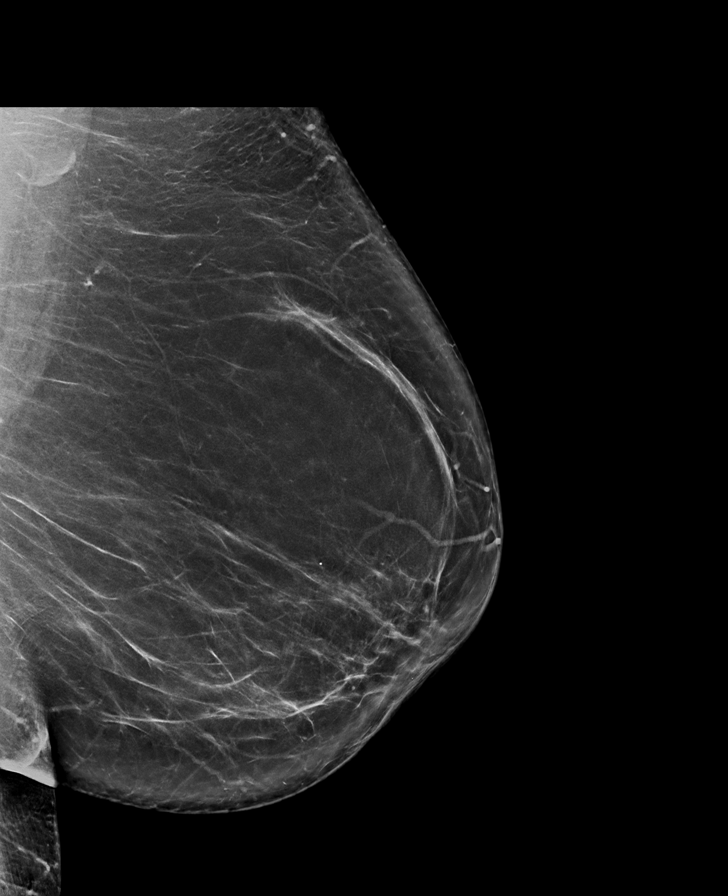

[L CC]
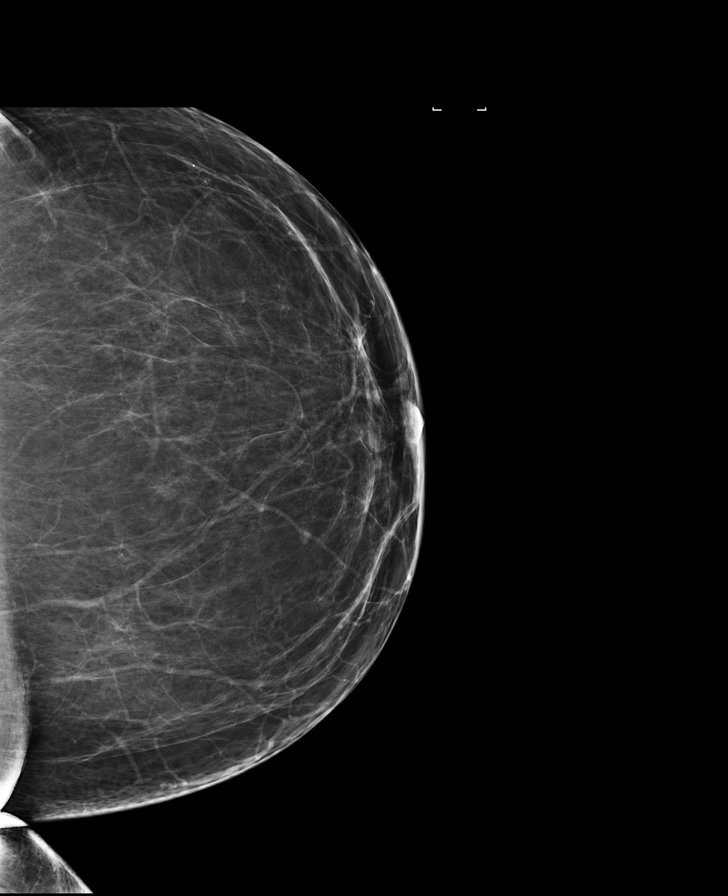

[L CC synth-2D]
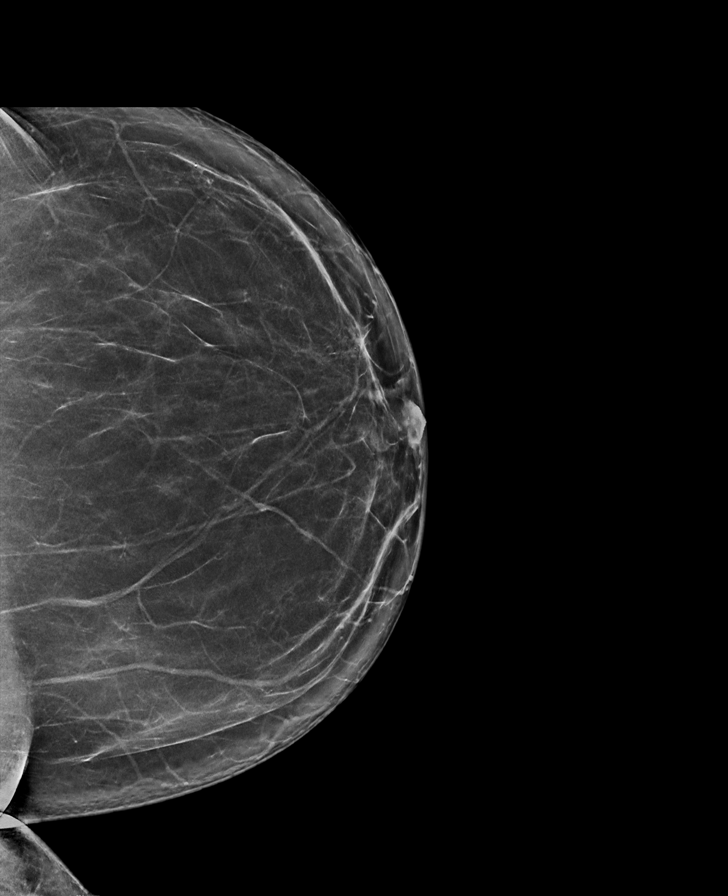

[R MLO synth-2D]
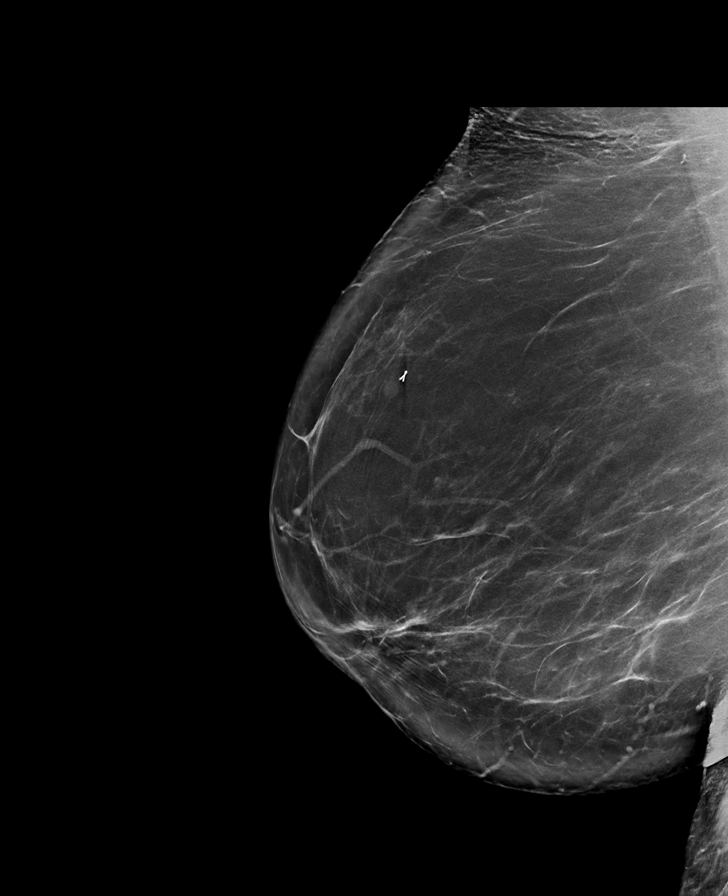

[R MLO (2 of 2)]
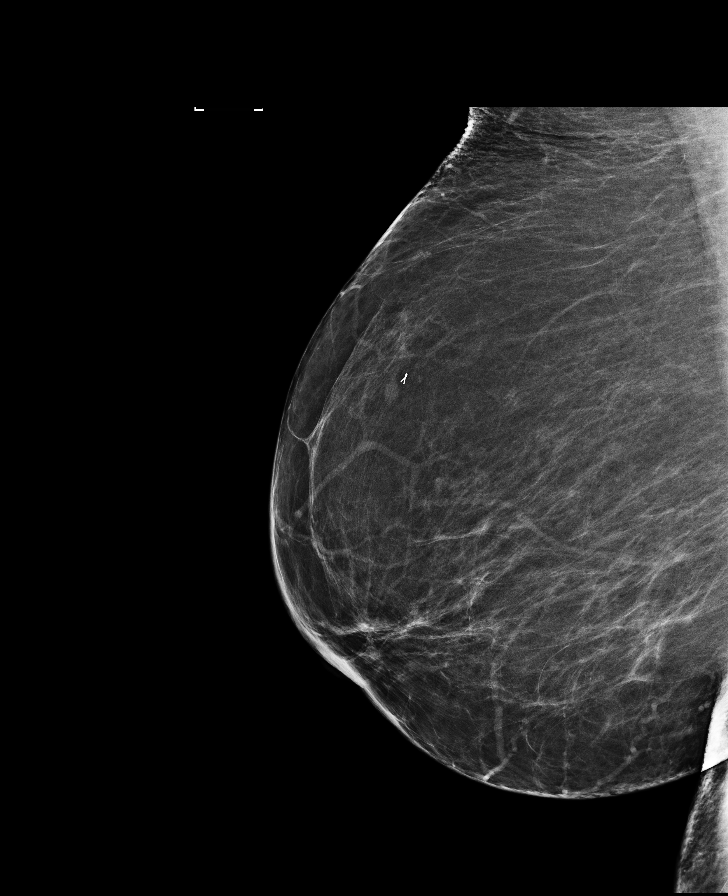

[L MLO]
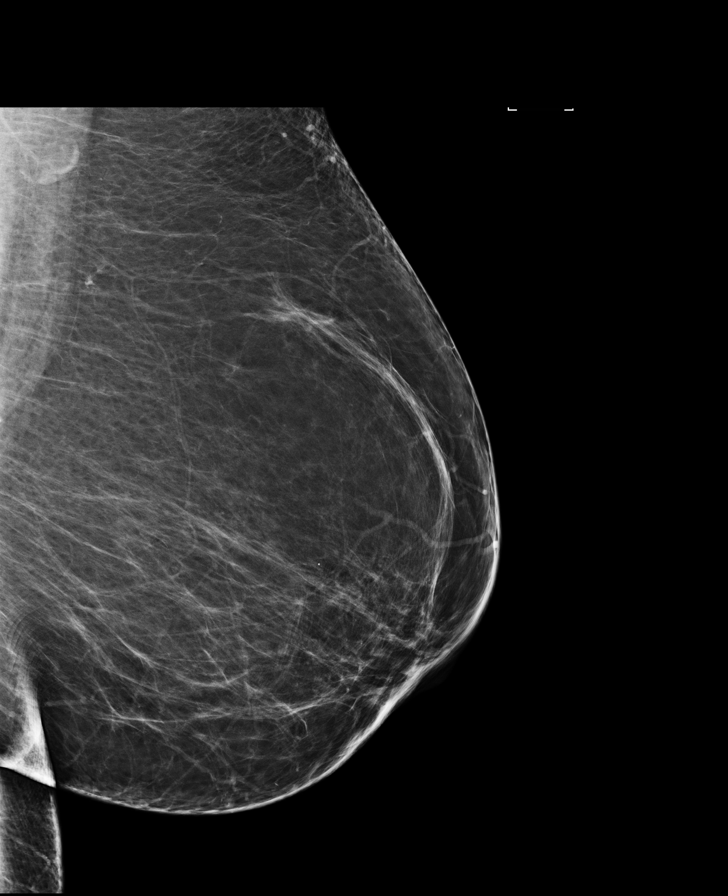

[R CC]
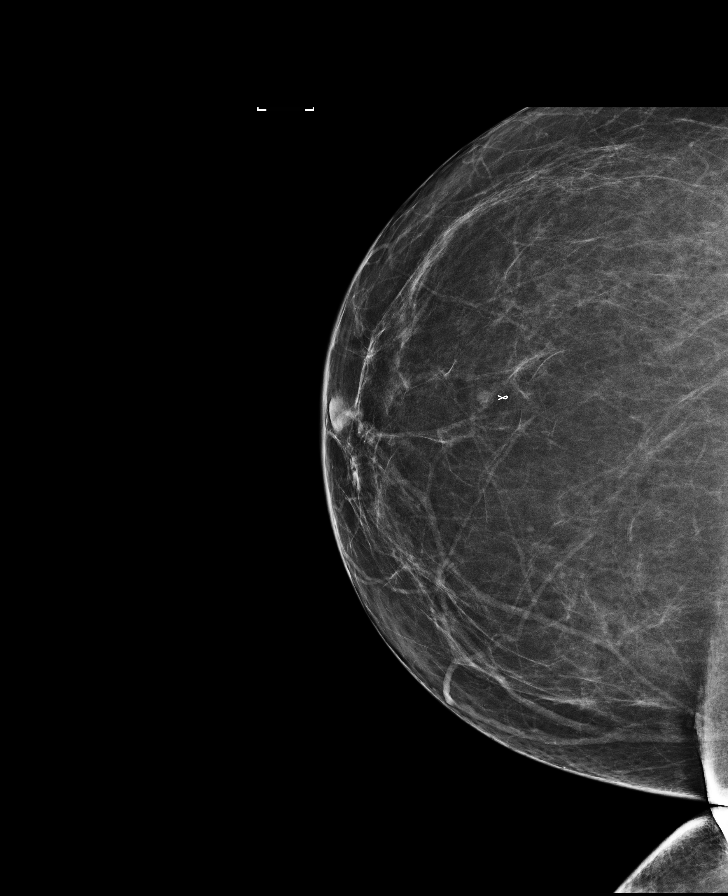

[8 of 29 positions shown; findings below may reference images not displayed]

FINDINGS: There are no findings suspicious for malignancy. Images were
processed with CAD.
IMPRESSION: No mammographic evidence of malignancy. A result letter of this
screening mammogram will be mailed directly to the patient.

RECOMMENDATION:
Screening mammogram in one year. (Code:25-W-ROJ)

BI-RADS CATEGORY  1: Negative.

## 2018-09-21 ENCOUNTER — Telehealth: Payer: Self-pay | Admitting: Internal Medicine

## 2018-09-21 NOTE — Telephone Encounter (Signed)
Copied from [place] [id number]. Topic: Quick Communication - Rx Refill/Question >> Sep 21, 2018  2:23 PM Bea Graff, NT wrote: Medication: ALPRAZolam Duanne Moron) 0.25 MG tablet. Pt requesting 90 day rx.   Has the patient contacted their pharmacy? Yes.   (Agent: If no, request that the patient contact the pharmacy for the refill.) (Agent: If yes, when and what did the pharmacy advise?)  Preferred Pharmacy (with phone number or street name): [facility], [place] [address] [phone] (Phone) [phone] (Fax)    Agent: Please be advised that RX refills may take up to 3 business days. We ask that you follow-up with your pharmacy.

## 2018-09-23 NOTE — Telephone Encounter (Signed)
Very sorry, but we do not normally do 90 day rx for controlled substances as this results in a large quantity of pills

## 2018-10-22 ENCOUNTER — Other Ambulatory Visit: Payer: Self-pay | Admitting: Internal Medicine

## 2018-10-22 MED ORDER — VENLAFAXINE HCL ER 150 MG PO CP24: 150.0000 mg | ORAL_CAPSULE | Freq: Every day | ORAL | 2 refills | Status: DC

## 2018-10-22 MED ORDER — ALPRAZOLAM 0.25 MG PO TABS: 0.2500 mg | ORAL_TABLET | Freq: Two times a day (BID) | ORAL | 5 refills | Status: DC | PRN

## 2018-10-22 NOTE — Telephone Encounter (Signed)
Sent refill for the venlafaxine.. pls advise on alprazolam../lmb

## 2018-10-22 NOTE — Telephone Encounter (Addendum)
Copied from [place] [id number]. Topic: Quick Communication - Rx Refill/Question >> Oct 22, 2018 10:59 AM Scherrie Gerlach wrote: Medication: venlafaxine XR (EFFEXOR-XR) 150 MG 24 hr capsule   90 days ALPRAZolam (XANAX) 0.25 MG tablet 30 day Pt states she has been out of the effexor 4 days now and she is having a terrible time.  Pt states she called the pharmacy last Wednesday, and then Friday.  Pt states she has been having headaches, more nightmares than usual, and cried all day yesterday.  Pt states she really needs today please.  [facility] [address], [place] [address] [phone] (Phone) [phone] (Fax)

## 2018-10-22 NOTE — Telephone Encounter (Signed)
Done erx 

## 2018-10-22 NOTE — Telephone Encounter (Signed)
MD approved and sent electronically to pof../lmb  

## 2018-10-27 ENCOUNTER — Other Ambulatory Visit: Payer: Self-pay | Admitting: Family Medicine

## 2018-10-27 MED ORDER — METOPROLOL SUCCINATE ER 50 MG PO TB24: 50.0000 mg | ORAL_TABLET | Freq: Every day | ORAL | 0 refills | Status: DC

## 2018-10-27 MED ORDER — TRIAMTERENE-HCTZ 37.5-25 MG PO TABS: 1.0000 | ORAL_TABLET | Freq: Every day | ORAL | 0 refills | Status: DC

## 2018-10-27 NOTE — Progress Notes (Signed)
I received a fax from [facility] stating patient is about to run out of her blood pressure medications.  I sent in her metoprolol and Maxzide #90 with no refills to help avoid any shortage.  We will forward this to Dr. Jenny Reichmann for any further refills.-Asked the Saturday clinic team to update patient.

## 2018-11-20 ENCOUNTER — Other Ambulatory Visit: Payer: Self-pay | Admitting: Internal Medicine

## 2018-11-20 MED ORDER — ROSUVASTATIN CALCIUM 20 MG PO TABS: 20.0000 mg | ORAL_TABLET | Freq: Every day | ORAL | 3 refills | Status: DC

## 2018-11-20 NOTE — Telephone Encounter (Signed)
Copied from [place] [id number]. Topic: Quick Communication - Rx Refill/Question >> Nov 20, 2018 12:00 PM Colvin Caroli N wrote: Medication: rosuvastatin (CRESTOR) 20 MG tablet   Patient is requesting refill of this medication.   Preferred Pharmacy (with phone number or street name):[facility] [address] [place] [address] [phone] (Phone) [phone] (Fax)

## 2018-12-14 ENCOUNTER — Other Ambulatory Visit: Payer: Self-pay | Admitting: Internal Medicine

## 2018-12-14 DIAGNOSIS — Z1231 Encounter for screening mammogram for malignant neoplasm of breast: Secondary | ICD-10-CM

## 2019-01-14 ENCOUNTER — Ambulatory Visit: Payer: PPO

## 2019-01-21 ENCOUNTER — Other Ambulatory Visit: Payer: Self-pay | Admitting: Family Medicine

## 2019-01-22 ENCOUNTER — Ambulatory Visit: Payer: PPO

## 2019-01-29 ENCOUNTER — Other Ambulatory Visit: Payer: Self-pay | Admitting: Internal Medicine

## 2019-01-29 MED ORDER — TRIAMTERENE-HCTZ 37.5-25 MG PO TABS: 1.0000 | ORAL_TABLET | Freq: Every day | ORAL | 0 refills | Status: DC

## 2019-01-29 MED ORDER — METOPROLOL SUCCINATE ER 50 MG PO TB24: 50.0000 mg | ORAL_TABLET | Freq: Every day | ORAL | 0 refills | Status: DC

## 2019-01-29 NOTE — Telephone Encounter (Signed)
Requested Prescriptions  Pending Prescriptions Disp Refills  . metoprolol succinate (TOPROL-XL) 50 MG 24 hr tablet 90 tablet 0    Sig: Take 1 tablet (50 mg total) by mouth daily. Take with or immediately following a meal.     Cardiovascular:  Beta Blockers Failed - 01/29/2019  3:55 PM      Failed - Valid encounter within last 6 months    Recent Outpatient Visits          10 months ago Preventative health care   [facility] -Georges Mouse, MD   1 year ago Essential hypertension   [facility] -Georges Mouse, MD   1 year ago [facility] -Georges Mouse, MD   2 years ago Hypothyroidism, unspecified hypothyroidism type   [facility] -Georges Mouse, MD   3 years ago Senile osteoporosis   [place], James W, MD      Future Appointments            In 1 week Jenny Reichmann Hunt Oris, MD [place], [place]   In 2 months Biagio Borg, MD [facility] [place], [place] BP in normal range    BP Readings from Last 1 Encounters:  03/28/18 122/78         Passed - Last Heart Rate in normal range    Pulse Readings from Last 1 Encounters:  03/28/18 75       . triamterene-hydrochlorothiazide (MAXZIDE-25) 37.5-25 MG tablet 90 tablet 0    Sig: Take 1 tablet by mouth daily.     Cardiovascular: Diuretic Combos Failed - 01/29/2019  3:55 PM      Failed - Na in normal range and within 360 days    Sodium  Date Value Ref Range Status  03/28/2018 132 (L) 135 - 145 mEq/L Final         Failed - Cr in normal range and within 360 days    Creatinine, Ser  Date Value Ref Range Status  03/28/2018 1.21 (H) 0.40 - 1.20 mg/dL Final         Failed - Valid encounter within last 6 months    Recent Outpatient Visits          10 months ago Preventative health care   [facility] -Georges Mouse, MD   1 year ago Essential hypertension   [facility] -Georges Mouse, MD   1 year ago [facility] -Georges Mouse, MD   2 years ago Hypothyroidism, unspecified hypothyroidism type   [place], James W, MD   3 years ago Senile osteoporosis   [place], MD      Future Appointments            In 1 week Jenny Reichmann Hunt Oris, MD [place], [place]   In 2 months Biagio Borg, MD [facility], [place] in normal range and within 360 days    Potassium  Date Value Ref Range Status  03/28/2018 3.5 3.5 - 5.1 mEq/L Final         Passed -  Ca in normal range and within 360 days    Calcium  Date Value Ref Range Status  03/28/2018 10.3 8.4 - 10.5 mg/dL Final         Passed - Last BP in normal range    BP Readings from Last 1 Encounters:  03/28/18 122/78       last labs 03/28/18; reviewed by PCP; recommended 1 yr. f/u

## 2019-01-29 NOTE — Telephone Encounter (Signed)
Copied from [place] [id number]. Topic: Quick Communication - Rx Refill/Question >> Jan 29, 2019  3:37 PM [place], [place] D wrote: Medication: metoprolol succinate (TOPROL-XL) 50 MG 24 hr tablet 90 Day supply / triamterene-hydrochlorothiazide (MAXZIDE-25) 37.5-25 MG tablet 90 day supply  Has the patient contacted their pharmacy? Yes.   (Agent: If no, request that the patient contact the pharmacy for the refill.) (Agent: If yes, when and what did the pharmacy advise?)  Preferred Pharmacy (with phone number or street name): [facility], [address] [phone] (Phone) [phone] (Fax)  Agent: Please be advised that RX refills may take up to 3 business days. We ask that you follow-up with your pharmacy.

## 2019-02-05 ENCOUNTER — Ambulatory Visit (INDEPENDENT_AMBULATORY_CARE_PROVIDER_SITE_OTHER): Payer: PPO | Admitting: Internal Medicine

## 2019-02-05 ENCOUNTER — Encounter: Payer: Self-pay | Admitting: Internal Medicine

## 2019-02-05 VITALS — BP 110/64 | HR 70 | Temp 97.9°F | Ht 63.0 in | Wt 218.0 lb

## 2019-02-05 DIAGNOSIS — M545 Low back pain, unspecified: Secondary | ICD-10-CM

## 2019-02-05 DIAGNOSIS — I10 Essential (primary) hypertension: Secondary | ICD-10-CM

## 2019-02-05 DIAGNOSIS — Z23 Encounter for immunization: Secondary | ICD-10-CM | POA: Diagnosis not present

## 2019-02-05 DIAGNOSIS — G8929 Other chronic pain: Secondary | ICD-10-CM | POA: Diagnosis not present

## 2019-02-05 DIAGNOSIS — E039 Hypothyroidism, unspecified: Secondary | ICD-10-CM

## 2019-02-05 MED ORDER — TRAMADOL HCL 50 MG PO TABS: 50.0000 mg | ORAL_TABLET | Freq: Four times a day (QID) | ORAL | 0 refills | Status: DC | PRN

## 2019-02-05 NOTE — Patient Instructions (Addendum)
You had the flu shot today  Please take all new medication as prescribed - the tramadol for pain  You will be contacted regarding the referral for: MRI for the lower back, and Neurosurgury  Please continue all other medications as before, and refills have been done if requested.  Please have the pharmacy call with any other refills you may need.  Please continue your efforts at being more active, low cholesterol diet, and weight control.  Please keep your appointments with your specialists as you may have planned

## 2019-02-05 NOTE — Assessment & Plan Note (Signed)
stable overall by history and exam, recent data reviewed with pt, and pt to continue medical treatment as before,  to f/u any worsening symptoms or concerns  

## 2019-02-05 NOTE — Progress Notes (Signed)
Subjective:    Patient ID: Elizabeth Buckley, female    DOB: Sep 18, 1951, 68 y.o.   MRN: [id number]  HPI  Here to f/u; overall doing ok,  Pt denies chest pain, increasing sob or doe, wheezing, orthopnea, PND, increased LE swelling, palpitations, dizziness or syncope.  Pt denies new neurological symptoms such as new headache, or facial or extremity weakness or numbness.  Pt denies polydipsia, polyuria, or low sugar episode.  Pt states overall good compliance with meds, mostly trying to follow appropriate diet, with wt overall stable,  But no exercise due to back pain, overall worsening x 3 wks ; just Back from [facility] trip with family with 3 wks lower back pain  Worse to walk, Better to sit or lie down, Pt denies bowel or bladder change, fever, wt loss,  worsening LE pain/numbness/weakness, or falls, but can only walk about "20 yards" until onset of pain. .Pt is S/p ESI last yr per ortho x 3, aks for NS eval as her family   Denies hyper or hypo thyroid symptoms such as voice, skin or hair change. Wt Readings from Last 3 Encounters:  02/05/19 218 lb (98.9 kg)  03/28/18 224 lb (101.6 kg)  09/27/17 224 lb (101.6 kg)   Past Medical History:  Diagnosis Date  . Anemia   . ANXIETY   . BACK PAIN   . Cataract   . CONSTIPATION, CHRONIC   . DEPRESSION   . GERD (gastroesophageal reflux disease)   . HYPERLIPIDEMIA   . HYPERTENSION   . NASH (NON-ALCOHOLIC STEATOHEPATITIS)   . Obesity, unspecified   . OSTEOPENIA   . Pneumonia    x3   Past Surgical History:  Procedure Laterality Date  . ABDOMINAL HYSTERECTOMY    . APPENDECTOMY    . BLADDER SUSPENSION  01/18/2012   Procedure: TRANSVAGINAL TAPE (TVT) PROCEDURE;  Surgeon: Daria Pastures, MD;  Location: [facility];  Service: Gynecology;  Laterality: N/A;  . BREAST BIOPSY Right   . CATARACT EXTRACTION     both eyes  . cataract surgery     left and right eye  . CHOLECYSTECTOMY    . COLONOSCOPY W/ BIOPSIES AND POLYPECTOMY  01/03/2003   submucosal lesion    . CYSTOCELE REPAIR  01/18/2012   Procedure: ANTERIOR REPAIR (CYSTOCELE);  Surgeon: Daria Pastures, MD;  Location: [facility];  Service: Gynecology;  Laterality: N/A;  . CYSTOSCOPY  01/18/2012   Procedure: CYSTOSCOPY;  Surgeon: Daria Pastures, MD;  Location: [facility];  Service: Gynecology;  Laterality: N/A;  . ELECTROCARDIOGRAM  07/27/2006  . s/p liver biopsy     Dr. Carlean Purl  . s/p lumbar surgery    . trigger finger repair left thumb    . TUBAL LIGATION    . UPPER GASTROINTESTINAL ENDOSCOPY  01/03/2003    reports that she quit smoking about 18 years ago. She has never used smokeless tobacco. She reports current alcohol use. She reports that she does not use drugs. family history includes Alcohol abuse in an other family member; Heart disease in her father; Hyperlipidemia in an other family member; Hypertension in an other family member. Allergies  Allergen Reactions  . Codeine Nausea Only   Current Outpatient Medications on File Prior to Visit  Medication Sig Dispense Refill  . albuterol (PROVENTIL HFA;VENTOLIN HFA) 108 (90 Base) MCG/ACT inhaler Inhale 2 puffs into the lungs every 6 (six) hours as needed for wheezing or shortness of breath. 1 Inhaler 2  . ALPRAZolam (XANAX) 0.25 MG tablet  Take 1 tablet (0.25 mg total) by mouth 2 (two) times daily as needed for anxiety. 60 tablet 5  . aspirin 81 MG tablet Take 81 mg by mouth daily.     . Cholecalciferol (VITAMIN D3) 1000 UNITS CAPS Take 5,000 Units by mouth daily.     Marland Kitchen ibuprofen (ADVIL,MOTRIN) 200 MG tablet Take 400 mg by mouth daily.    Marland Kitchen levothyroxine (SYNTHROID, LEVOTHROID) 50 MCG tablet Take 1 tablet (50 mcg total) by mouth daily. 90 tablet 3  . meclizine (ANTIVERT) 12.5 MG tablet Take 1 tablet (12.5 mg total) by mouth 3 (three) times daily as needed. 30 tablet 2  . metoprolol succinate (TOPROL-XL) 50 MG 24 hr tablet Take 1 tablet (50 mg total) by mouth daily. Take with or immediately following a meal. 90 tablet 0  . polyethylene glycol  (MIRALAX / GLYCOLAX) packet Take 17 g by mouth daily as needed. For constipation 14 each 6  . potassium chloride (KLOR-CON 10) 10 MEQ tablet Take 1 tablet (10 mEq total) by mouth daily. Must keep appt for future refills 30 tablet 0  . rosuvastatin (CRESTOR) 20 MG tablet Take 1 tablet (20 mg total) by mouth daily. 90 tablet 3  . triamterene-hydrochlorothiazide (MAXZIDE-25) 37.5-25 MG tablet Take 1 tablet by mouth daily. 90 tablet 0  . venlafaxine XR (EFFEXOR-XR) 150 MG 24 hr capsule Take 1 capsule (150 mg total) by mouth daily with breakfast. 90 capsule 2   No current facility-administered medications on file prior to visit.    Review of Systems  Constitutional: Negative for other unusual diaphoresis or sweats HENT: Negative for ear discharge or swelling Eyes: Negative for other worsening visual disturbances Respiratory: Negative for stridor or other swelling  Gastrointestinal: Negative for worsening distension or other blood Genitourinary: Negative for retention or other urinary change Musculoskeletal: Negative for other MSK pain or swelling Skin: Negative for color change or other new lesions Neurological: Negative for worsening tremors and other numbness  Psychiatric/Behavioral: Negative for worsening agitation or other fatigue All other system neg per pt    Objective:   Physical Exam BP 110/64   Pulse 70   Temp 97.9 F (36.6 C) (Oral)   Ht 5\' 3"  (1.6 m)   Wt 218 lb (98.9 kg)   SpO2 95%   BMI 38.62 kg/m  VS noted,  Constitutional: Pt appears in NAD HENT: Head: NCAT.  Right Ear: External ear normal.  Left Ear: External ear normal.  Eyes: . Pupils are equal, round, and reactive to light. Conjunctivae and EOM are normal Nose: without d/c or deformity Neck: Neck supple. Gross normal ROM Cardiovascular: Normal rate and regular rhythm.   Pulmonary/Chest: Effort normal and breath sounds without rales or wheezing.  Abd:  Soft, NT, ND, + BS, no organomegaly Spine: nontender in  midline except for lowest lumbar without swelling or rash Neurological: Pt is alert. At baseline orientation, motor grossly intact Skin: Skin is warm. No rashes, other new lesions, no LE edema Psychiatric: Pt behavior is normal without agitation  No other exam findings Lab Results  Component Value Date   WBC 6.2 03/28/2018   HGB 13.5 03/28/2018   HCT 39.8 03/28/2018   PLT 257.0 03/28/2018   GLUCOSE 92 03/28/2018   CHOL 198 03/28/2018   TRIG 250.0 (H) 03/28/2018   HDL 55.00 03/28/2018   LDLDIRECT 105.0 03/28/2018   LDLCALC 95 07/05/2017   ALT 23 03/28/2018   AST 37 03/28/2018   NA 132 (L) 03/28/2018   K 3.5 03/28/2018  CL 94 (L) 03/28/2018   CREATININE 1.21 (H) 03/28/2018   BUN 14 03/28/2018   CO2 30 03/28/2018   TSH 6.65 (H) 03/28/2018   INR 1.0 11/10/2008       Assessment & Plan:

## 2019-02-05 NOTE — Assessment & Plan Note (Signed)
Mod to severe, not responding to conservative tx,  for pain control MRI LS spine, and NS referral,  to f/u any worsening symptoms or concerns

## 2019-02-18 ENCOUNTER — Ambulatory Visit: Payer: PPO

## 2019-02-24 ENCOUNTER — Ambulatory Visit
Admission: RE | Admit: 2019-02-24 | Discharge: 2019-02-24 | Disposition: A | Discharge status: Home / Self Care | Payer: PPO | Source: Ambulatory Visit | Attending: Internal Medicine | Admitting: Internal Medicine

## 2019-02-24 DIAGNOSIS — M5136 Other intervertebral disc degeneration, lumbar region: Secondary | ICD-10-CM | POA: Diagnosis not present

## 2019-02-24 DIAGNOSIS — M545 Low back pain, unspecified: Secondary | ICD-10-CM

## 2019-02-24 DIAGNOSIS — G8929 Other chronic pain: Secondary | ICD-10-CM

## 2019-02-26 ENCOUNTER — Ambulatory Visit
Admission: RE | Admit: 2019-02-26 | Discharge: 2019-02-26 | Disposition: A | Discharge status: Home / Self Care | Payer: PPO | Source: Ambulatory Visit | Attending: Internal Medicine | Admitting: Internal Medicine

## 2019-02-26 DIAGNOSIS — Z1231 Encounter for screening mammogram for malignant neoplasm of breast: Secondary | ICD-10-CM | POA: Diagnosis not present

## 2019-02-28 ENCOUNTER — Other Ambulatory Visit: Payer: Self-pay | Admitting: Internal Medicine

## 2019-02-28 DIAGNOSIS — R928 Other abnormal and inconclusive findings on diagnostic imaging of breast: Secondary | ICD-10-CM

## 2019-03-05 ENCOUNTER — Other Ambulatory Visit: Payer: PPO

## 2019-03-14 ENCOUNTER — Ambulatory Visit
Admission: RE | Admit: 2019-03-14 | Discharge: 2019-03-14 | Disposition: A | Discharge status: Home / Self Care | Payer: PPO | Source: Ambulatory Visit | Attending: Internal Medicine | Admitting: Internal Medicine

## 2019-03-14 ENCOUNTER — Other Ambulatory Visit: Payer: Self-pay | Admitting: Internal Medicine

## 2019-03-14 ENCOUNTER — Other Ambulatory Visit: Payer: Self-pay

## 2019-03-14 DIAGNOSIS — R928 Other abnormal and inconclusive findings on diagnostic imaging of breast: Secondary | ICD-10-CM | POA: Diagnosis not present

## 2019-03-14 DIAGNOSIS — N6489 Other specified disorders of breast: Secondary | ICD-10-CM

## 2019-03-19 ENCOUNTER — Other Ambulatory Visit: Payer: PPO

## 2019-03-19 ENCOUNTER — Inpatient Hospital Stay: Admission: RE | Admit: 2019-03-19 | Payer: PPO | Source: Ambulatory Visit

## 2019-03-25 ENCOUNTER — Encounter: Payer: Self-pay | Admitting: Internal Medicine

## 2019-04-01 ENCOUNTER — Other Ambulatory Visit: Payer: Self-pay

## 2019-04-01 ENCOUNTER — Ambulatory Visit (INDEPENDENT_AMBULATORY_CARE_PROVIDER_SITE_OTHER): Payer: PPO | Admitting: Internal Medicine

## 2019-04-01 DIAGNOSIS — I10 Essential (primary) hypertension: Secondary | ICD-10-CM

## 2019-04-01 NOTE — Progress Notes (Signed)
Pt not seen, failed virtual visit, unable to further contact by phone

## 2019-04-29 ENCOUNTER — Other Ambulatory Visit: Payer: Self-pay | Admitting: Internal Medicine

## 2019-07-15 ENCOUNTER — Other Ambulatory Visit: Payer: Self-pay | Admitting: Internal Medicine

## 2019-09-10 ENCOUNTER — Other Ambulatory Visit: Payer: Self-pay | Admitting: Internal Medicine

## 2019-09-10 NOTE — Telephone Encounter (Signed)
Done erx 

## 2019-09-16 ENCOUNTER — Ambulatory Visit
Admission: RE | Admit: 2019-09-16 | Discharge: 2019-09-16 | Disposition: A | Discharge status: Home / Self Care | Payer: PPO | Source: Ambulatory Visit | Attending: Internal Medicine | Admitting: Internal Medicine

## 2019-09-16 ENCOUNTER — Other Ambulatory Visit: Payer: Self-pay | Admitting: Internal Medicine

## 2019-09-16 ENCOUNTER — Other Ambulatory Visit: Payer: Self-pay

## 2019-09-16 DIAGNOSIS — N6489 Other specified disorders of breast: Secondary | ICD-10-CM

## 2019-09-16 DIAGNOSIS — R928 Other abnormal and inconclusive findings on diagnostic imaging of breast: Secondary | ICD-10-CM | POA: Diagnosis not present

## 2019-10-10 ENCOUNTER — Other Ambulatory Visit: Payer: Self-pay | Admitting: Internal Medicine

## 2019-10-10 NOTE — Telephone Encounter (Signed)
Done erx 

## 2019-11-05 ENCOUNTER — Other Ambulatory Visit: Payer: Self-pay | Admitting: Internal Medicine

## 2019-11-13 ENCOUNTER — Other Ambulatory Visit: Payer: Self-pay

## 2019-12-02 ENCOUNTER — Other Ambulatory Visit: Payer: Self-pay | Admitting: Internal Medicine

## 2020-02-05 ENCOUNTER — Other Ambulatory Visit: Payer: Self-pay | Admitting: Internal Medicine

## 2020-02-05 NOTE — Telephone Encounter (Signed)
Please refill as per office routine med refill policy (all routine meds refilled for 3 mo or monthly per pt preference up to one year from last visit, then month to month grace period for 3 mo, then further med refills will have to be denied)  

## 2020-02-12 ENCOUNTER — Other Ambulatory Visit: Payer: Self-pay | Admitting: Internal Medicine

## 2020-02-12 NOTE — Telephone Encounter (Signed)
Please refill as per office routine med refill policy (all routine meds refilled for 3 mo or monthly per pt preference up to one year from last visit, then month to month grace period for 3 mo, then further med refills will have to be denied)  

## 2020-02-13 ENCOUNTER — Other Ambulatory Visit: Payer: Self-pay

## 2020-02-13 MED ORDER — TRIAMTERENE-HCTZ 37.5-25 MG PO TABS: 1.0000 | ORAL_TABLET | Freq: Every day | ORAL | 0 refills | Status: DC

## 2020-02-13 MED ORDER — METOPROLOL SUCCINATE ER 50 MG PO TB24: ORAL_TABLET | ORAL | 0 refills | Status: DC

## 2020-02-20 ENCOUNTER — Ambulatory Visit: Payer: PPO | Attending: Internal Medicine

## 2020-02-20 DIAGNOSIS — Z23 Encounter for immunization: Secondary | ICD-10-CM | POA: Insufficient documentation

## 2020-02-20 NOTE — Progress Notes (Signed)
   Covid-19 Vaccination Clinic  Name:  Elizabeth Buckley    MRN: [id number] DOB: 1951/05/21  02/20/2020  [name] was observed post Covid-19 immunization for 15 minutes without incident. She was provided with Vaccine Information Sheet and instruction to access the V-Safe system.   [name] was instructed to call 911 with any severe reactions post vaccine: Marland Kitchen Difficulty breathing  . Swelling of face and throat  . A fast heartbeat  . A bad rash all over body  . Dizziness and weakness   Immunizations Administered    Name Date Dose VIS Date Route   Pfizer COVID-19 Vaccine 02/20/2020  9:18 AM 0.3 mL 11/29/2019 Intramuscular   Manufacturer: [place]   Lot: [id number]   [place]: [id number]

## 2020-03-02 ENCOUNTER — Ambulatory Visit
Admission: RE | Admit: 2020-03-02 | Discharge: 2020-03-02 | Disposition: A | Discharge status: Home / Self Care | Payer: PPO | Source: Ambulatory Visit | Attending: Internal Medicine | Admitting: Internal Medicine

## 2020-03-02 ENCOUNTER — Other Ambulatory Visit: Payer: Self-pay

## 2020-03-02 DIAGNOSIS — N6489 Other specified disorders of breast: Secondary | ICD-10-CM

## 2020-03-02 DIAGNOSIS — R928 Other abnormal and inconclusive findings on diagnostic imaging of breast: Secondary | ICD-10-CM | POA: Diagnosis not present

## 2020-03-03 ENCOUNTER — Other Ambulatory Visit: Payer: Self-pay | Admitting: Internal Medicine

## 2020-03-03 NOTE — Telephone Encounter (Signed)
Please refill as per office routine med refill policy (all routine meds refilled for 3 mo or monthly per pt preference up to one year from last visit, then month to month grace period for 3 mo, then further med refills will have to be denied)  

## 2020-03-18 ENCOUNTER — Ambulatory Visit: Payer: PPO | Attending: Internal Medicine

## 2020-03-18 DIAGNOSIS — Z23 Encounter for immunization: Secondary | ICD-10-CM

## 2020-03-18 NOTE — Progress Notes (Signed)
   Covid-19 Vaccination Clinic  Name:  Elizabeth Buckley    MRN: [id number] DOB: 1951/03/24  03/18/2020  [name] was observed post Covid-19 immunization for 15 minutes without incident. She was provided with Vaccine Information Sheet and instruction to access the V-Safe system.   [name] was instructed to call 911 with any severe reactions post vaccine: Marland Kitchen Difficulty breathing  . Swelling of face and throat  . A fast heartbeat  . A bad rash all over body  . Dizziness and weakness   Immunizations Administered    Name Date Dose VIS Date Route   Pfizer COVID-19 Vaccine 03/18/2020  9:06 AM 0.3 mL 11/29/2019 Intramuscular   Manufacturer: [place]   Lot: [id number]   [place]: [id number]

## 2020-04-06 ENCOUNTER — Other Ambulatory Visit: Payer: Self-pay

## 2020-04-06 ENCOUNTER — Encounter: Payer: Self-pay | Admitting: Internal Medicine

## 2020-04-06 ENCOUNTER — Ambulatory Visit (INDEPENDENT_AMBULATORY_CARE_PROVIDER_SITE_OTHER): Payer: PPO | Admitting: Internal Medicine

## 2020-04-06 VITALS — BP 130/72 | HR 61 | Temp 98.1°F | Resp 16 | Ht 63.0 in | Wt 206.0 lb

## 2020-04-06 DIAGNOSIS — E559 Vitamin D deficiency, unspecified: Secondary | ICD-10-CM

## 2020-04-06 DIAGNOSIS — D500 Iron deficiency anemia secondary to blood loss (chronic): Secondary | ICD-10-CM

## 2020-04-06 DIAGNOSIS — R739 Hyperglycemia, unspecified: Secondary | ICD-10-CM | POA: Diagnosis not present

## 2020-04-06 DIAGNOSIS — Z0000 Encounter for general adult medical examination without abnormal findings: Secondary | ICD-10-CM | POA: Diagnosis not present

## 2020-04-06 DIAGNOSIS — E538 Deficiency of other specified B group vitamins: Secondary | ICD-10-CM | POA: Diagnosis not present

## 2020-04-06 MED ORDER — ALPRAZOLAM 0.25 MG PO TABS: 0.2500 mg | ORAL_TABLET | Freq: Two times a day (BID) | ORAL | 5 refills | Status: DC | PRN

## 2020-04-06 NOTE — Assessment & Plan Note (Signed)

## 2020-04-06 NOTE — Patient Instructions (Signed)
Please continue all other medications as before, and refills have been done if requested.  Please have the pharmacy call with any other refills you may need.  Please continue your efforts at being more active, low cholesterol diet, and weight control.  You are otherwise up to date with prevention measures today.  Please keep your appointments with your specialists as you may have planned  Please consider [facility] for your GYN follow up  Please go to the LAB at the blood drawing area for the tests to be done  You will be contacted by phone if any changes need to be made immediately.  Otherwise, you will receive a letter about your results with an explanation, but please check with MyChart first.  Please remember to sign up for MyChart if you have not done so, as this will be important to you in the future with finding out test results, communicating by private email, and scheduling acute appointments online when needed.  Please make an Appointment to return for your 1 year visit, or sooner if needed

## 2020-04-06 NOTE — Progress Notes (Signed)
Subjective:    Patient ID: Elizabeth Buckley, female    DOB: 1951/04/17, 69 y.o.   MRN: [id number]  HPI  Here for wellness and f/u;  Overall doing ok;  Pt denies Chest pain, worsening SOB, DOE, wheezing, orthopnea, PND, worsening LE edema, palpitations, dizziness or syncope.  Pt denies neurological change such as new headache, facial or extremity weakness.  Pt denies polydipsia, polyuria, or low sugar symptoms. Pt states overall good compliance with treatment and medications, good tolerability, and has been trying to follow appropriate diet.  Pt denies worsening depressive symptoms, suicidal ideation or panic. No fever, night sweats, wt loss, loss of appetite, or other constitutional symptoms.  Pt states good ability with ADL's, has low fall risk, home safety reviewed and adequate, no other significant changes in hearing or vision, and only occasionally active with exercise,  Lost wt with better diet .   Wt Readings from Last 3 Encounters:  04/06/20 206 lb (93.4 kg)  02/05/19 218 lb (98.9 kg)  03/28/18 224 lb (101.6 kg)   Past Medical History:  Diagnosis Date  . Anemia   . ANXIETY   . BACK PAIN   . Cataract   . CONSTIPATION, CHRONIC   . DEPRESSION   . GERD (gastroesophageal reflux disease)   . HYPERLIPIDEMIA   . HYPERTENSION   . NASH (NON-ALCOHOLIC STEATOHEPATITIS)   . Obesity, unspecified   . OSTEOPENIA   . Pneumonia    x3   Past Surgical History:  Procedure Laterality Date  . ABDOMINAL HYSTERECTOMY    . APPENDECTOMY    . BLADDER SUSPENSION  01/18/2012   Procedure: TRANSVAGINAL TAPE (TVT) PROCEDURE;  Surgeon: Daria Pastures, MD;  Location: [facility];  Service: Gynecology;  Laterality: N/A;  . BREAST BIOPSY Right   . CATARACT EXTRACTION     both eyes  . cataract surgery     left and right eye  . CHOLECYSTECTOMY    . COLONOSCOPY W/ BIOPSIES AND POLYPECTOMY  01/03/2003   submucosal lesion  . CYSTOCELE REPAIR  01/18/2012   Procedure: ANTERIOR REPAIR (CYSTOCELE);  Surgeon:  Daria Pastures, MD;  Location: [facility];  Service: Gynecology;  Laterality: N/A;  . CYSTOSCOPY  01/18/2012   Procedure: CYSTOSCOPY;  Surgeon: Daria Pastures, MD;  Location: [facility];  Service: Gynecology;  Laterality: N/A;  . ELECTROCARDIOGRAM  07/27/2006  . s/p liver biopsy     Dr. Carlean Purl  . s/p lumbar surgery    . trigger finger repair left thumb    . TUBAL LIGATION    . UPPER GASTROINTESTINAL ENDOSCOPY  01/03/2003    reports that she quit smoking about 19 years ago. She has never used smokeless tobacco. She reports current alcohol use. She reports that she does not use drugs. family history includes Alcohol abuse in an other family member; Heart disease in her father; Hyperlipidemia in an other family member; Hypertension in an other family member. Allergies  Allergen Reactions  . Codeine Nausea Only   Current Outpatient Medications on File Prior to Visit  Medication Sig Dispense Refill  . albuterol (PROVENTIL HFA;VENTOLIN HFA) 108 (90 Base) MCG/ACT inhaler Inhale 2 puffs into the lungs every 6 (six) hours as needed for wheezing or shortness of breath. 1 Inhaler 2  . aspirin 81 MG tablet Take 81 mg by mouth daily.     . Cholecalciferol (VITAMIN D3) 1000 UNITS CAPS Take 5,000 Units by mouth daily.     Marland Kitchen ibuprofen (ADVIL,MOTRIN) 200 MG tablet Take 400 mg  by mouth daily.    Marland Kitchen levothyroxine (EUTHYROX) 50 MCG tablet Take 1 tablet (50 mcg total) by mouth daily. Annual appt due in Annual must see provider for future refills 90 tablet 0  . meclizine (ANTIVERT) 12.5 MG tablet Take 1 tablet (12.5 mg total) by mouth 3 (three) times daily as needed. 30 tablet 2  . metoprolol succinate (TOPROL-XL) 50 MG 24 hr tablet TAKE 1 TABLET BY MOUTH ONCE DAILY TAKE  WITH  OR  IMMEDIATELY  FOLLOWING  A  MEAL 90 tablet 0  . polyethylene glycol (MIRALAX / GLYCOLAX) packet Take 17 g by mouth daily as needed. For constipation 14 each 6  . potassium chloride (KLOR-CON 10) 10 MEQ tablet Take 1 tablet (10 mEq total)  by mouth daily. Must keep appt for future refills 30 tablet 0  . rosuvastatin (CRESTOR) 20 MG tablet Take 1 tablet (20 mg total) by mouth daily. Annual appt due in [facility] must see provider for future refills 30 tablet 0  . traMADol (ULTRAM) 50 MG tablet Take 1 tablet (50 mg total) by mouth every 6 (six) hours as needed. 30 tablet 0  . triamterene-hydrochlorothiazide (MAXZIDE-25) 37.5-25 MG tablet Take 1 tablet by mouth daily. 90 tablet 0  . venlafaxine XR (EFFEXOR-XR) 150 MG 24 hr capsule TAKE 1 CAPSULE BY MOUTH ONCE DAILY WITH BREAKFAST 90 capsule 1   No current facility-administered medications on file prior to visit.   Review of Systems All otherwise neg per pt     Objective:   Physical Exam BP 130/72 (BP Location: Right Arm, Patient Position: Sitting, Cuff Size: Large)   Pulse 61   Temp 98.1 F (36.7 C) (Oral)   Resp 16   Ht 5\' 3"  (1.6 m)   Wt 206 lb (93.4 kg)   SpO2 95%   BMI 36.49 kg/m  VS noted,  Constitutional: Pt appears in NAD HENT: Head: NCAT.  Right Ear: External ear normal.  Left Ear: External ear normal.  Eyes: . Pupils are equal, round, and reactive to light. Conjunctivae and EOM are normal Nose: without d/c or deformity Neck: Neck supple. Gross normal ROM Cardiovascular: Normal rate and regular rhythm.   Pulmonary/Chest: Effort normal and breath sounds without rales or wheezing.  Abd:  Soft, NT, ND, + BS, no organomegaly Neurological: Pt is alert. At baseline orientation, motor grossly intact Skin: Skin is warm. No rashes, other new lesions, no LE edema Psychiatric: Pt behavior is normal without agitation  All otherwise neg per pt Lab Results  Component Value Date   WBC 6.2 03/28/2018   HGB 13.5 03/28/2018   HCT 39.8 03/28/2018   PLT 257.0 03/28/2018   GLUCOSE 92 03/28/2018   CHOL 198 03/28/2018   TRIG 250.0 (H) 03/28/2018   HDL 55.00 03/28/2018   LDLDIRECT 105.0 03/28/2018   LDLCALC 95 07/05/2017   ALT 23 03/28/2018   AST 37 03/28/2018   NA 132 (L)  03/28/2018   K 3.5 03/28/2018   CL 94 (L) 03/28/2018   CREATININE 1.21 (H) 03/28/2018   BUN 14 03/28/2018   CO2 30 03/28/2018   TSH 6.65 (H) 03/28/2018   INR 1.0 11/10/2008       Assessment & Plan:

## 2020-04-06 NOTE — Assessment & Plan Note (Signed)
For iron lab f/u

## 2020-04-07 ENCOUNTER — Encounter: Payer: Self-pay | Admitting: Internal Medicine

## 2020-04-07 ENCOUNTER — Other Ambulatory Visit: Payer: Self-pay | Admitting: Internal Medicine

## 2020-04-07 LAB — LIPID PANEL
Cholesterol: 206 mg/dL — ABNORMAL HIGH (ref 0–200)
HDL: 57.4 mg/dL (ref >39.00)
NonHDL: 148.69
Total CHOL/HDL Ratio: 4
Triglycerides: 208 mg/dL — ABNORMAL HIGH (ref 0.0–149.0)
VLDL: 41.6 mg/dL — ABNORMAL HIGH (ref 0.0–40.0)

## 2020-04-07 LAB — CBC WITH DIFFERENTIAL/PLATELET
Basophils Absolute: 0 10*3/uL (ref 0.0–0.1)
Basophils Relative: 0.6 % (ref 0.0–3.0)
Eosinophils Absolute: 0.4 10*3/uL (ref 0.0–0.7)
Eosinophils Relative: 4.9 % (ref 0.0–5.0)
HCT: 42 % (ref 36.0–46.0)
Hemoglobin: 14 g/dL (ref 12.0–15.0)
Lymphocytes Relative: 34.5 % (ref 12.0–46.0)
Lymphs Abs: 2.6 10*3/uL (ref 0.7–4.0)
MCHC: 33.4 g/dL (ref 30.0–36.0)
MCV: 90.5 fl (ref 78.0–100.0)
Monocytes Absolute: 0.4 10*3/uL (ref 0.1–1.0)
Monocytes Relative: 5.4 % (ref 3.0–12.0)
Neutro Abs: 4.2 10*3/uL (ref 1.4–7.7)
Neutrophils Relative %: 54.6 % (ref 43.0–77.0)
Platelets: 272 10*3/uL (ref 150.0–400.0)
RBC: 4.63 Mil/uL (ref 3.87–5.11)
RDW: 15.1 % (ref 11.5–15.5)
WBC: 7.6 10*3/uL (ref 4.0–10.5)

## 2020-04-07 LAB — IBC PANEL
Iron: 65 ug/dL (ref 42–145)
Saturation Ratios: 12.4 % — ABNORMAL LOW (ref 20.0–50.0)
Transferrin: 373 mg/dL — ABNORMAL HIGH (ref 212.0–360.0)

## 2020-04-07 LAB — VITAMIN B12: Vitamin B-12: 1209 pg/mL — ABNORMAL HIGH (ref 211–911)

## 2020-04-07 LAB — HEMOGLOBIN A1C: Hgb A1c MFr Bld: 5.4 % (ref 4.6–6.5)

## 2020-04-07 LAB — URINALYSIS, ROUTINE W REFLEX MICROSCOPIC
Bilirubin Urine: NEGATIVE
Hgb urine dipstick: NEGATIVE
Ketones, ur: NEGATIVE
Leukocytes,Ua: NEGATIVE
Nitrite: NEGATIVE
RBC / HPF: NONE SEEN (ref 0–?)
Specific Gravity, Urine: 1.015 (ref 1.000–1.030)
Total Protein, Urine: NEGATIVE
Urine Glucose: NEGATIVE
Urobilinogen, UA: 1 (ref 0.0–1.0)
pH: 6.5 (ref 5.0–8.0)

## 2020-04-07 LAB — HEPATIC FUNCTION PANEL
ALT: 24 U/L (ref 0–35)
AST: 37 U/L (ref 0–37)
Albumin: 4.3 g/dL (ref 3.5–5.2)
Alkaline Phosphatase: 75 U/L (ref 39–117)
Bilirubin, Direct: 0.1 mg/dL (ref 0.0–0.3)
Total Bilirubin: 0.5 mg/dL (ref 0.2–1.2)
Total Protein: 7.8 g/dL (ref 6.0–8.3)

## 2020-04-07 LAB — BASIC METABOLIC PANEL
BUN: 15 mg/dL (ref 6–23)
CO2: 30 mEq/L (ref 19–32)
Calcium: 10.5 mg/dL (ref 8.4–10.5)
Chloride: 96 mEq/L (ref 96–112)
Creatinine, Ser: 1.05 mg/dL (ref 0.40–1.20)
GFR: 51.85 mL/min — ABNORMAL LOW (ref >60.00)
Glucose, Bld: 92 mg/dL (ref 70–99)
Potassium: 3.8 mEq/L (ref 3.5–5.1)
Sodium: 135 mEq/L (ref 135–145)

## 2020-04-07 LAB — LDL CHOLESTEROL, DIRECT: Direct LDL: 114 mg/dL

## 2020-04-07 LAB — VITAMIN D 25 HYDROXY (VIT D DEFICIENCY, FRACTURES): VITD: 52.52 ng/mL (ref 30.00–100.00)

## 2020-04-07 LAB — TSH: TSH: 2.85 u[IU]/mL (ref 0.35–4.50)

## 2020-04-07 MED ORDER — ROSUVASTATIN CALCIUM 40 MG PO TABS: 40.0000 mg | ORAL_TABLET | Freq: Every day | ORAL | 3 refills | Status: DC

## 2020-04-13 ENCOUNTER — Other Ambulatory Visit: Payer: Self-pay | Admitting: Internal Medicine

## 2020-05-12 ENCOUNTER — Other Ambulatory Visit: Payer: Self-pay | Admitting: Internal Medicine

## 2020-08-18 ENCOUNTER — Other Ambulatory Visit: Payer: Self-pay | Admitting: Internal Medicine

## 2020-08-18 NOTE — Telephone Encounter (Signed)
Please refill as per office routine med refill policy (all routine meds refilled for 3 mo or monthly per pt preference up to one year from last visit, then month to month grace period for 3 mo, then further med refills will have to be denied)  

## 2020-09-21 DIAGNOSIS — H04123 Dry eye syndrome of bilateral lacrimal glands: Secondary | ICD-10-CM | POA: Diagnosis not present

## 2020-09-21 DIAGNOSIS — H524 Presbyopia: Secondary | ICD-10-CM | POA: Diagnosis not present

## 2020-09-21 DIAGNOSIS — Z961 Presence of intraocular lens: Secondary | ICD-10-CM | POA: Diagnosis not present

## 2020-11-16 ENCOUNTER — Other Ambulatory Visit: Payer: Self-pay | Admitting: Internal Medicine

## 2020-11-16 MED ORDER — METOPROLOL SUCCINATE ER 50 MG PO TB24
ORAL_TABLET | ORAL | 0 refills | Status: DC
Start: 2020-11-16 — End: 2021-02-22

## 2020-11-16 MED ORDER — TRIAMTERENE-HCTZ 37.5-25 MG PO TABS
1.0000 | ORAL_TABLET | Freq: Every day | ORAL | 0 refills | Status: DC
Start: 2020-11-16 — End: 2021-02-22

## 2020-11-16 MED ORDER — LEVOTHYROXINE SODIUM 50 MCG PO TABS
ORAL_TABLET | ORAL | 0 refills | Status: DC
Start: 2020-11-16 — End: 2021-02-22

## 2021-01-19 DIAGNOSIS — Z23 Encounter for immunization: Secondary | ICD-10-CM | POA: Diagnosis not present

## 2021-01-21 ENCOUNTER — Other Ambulatory Visit: Payer: Self-pay | Admitting: Internal Medicine

## 2021-01-21 DIAGNOSIS — Z1231 Encounter for screening mammogram for malignant neoplasm of breast: Secondary | ICD-10-CM

## 2021-02-22 ENCOUNTER — Other Ambulatory Visit: Payer: Self-pay | Admitting: Internal Medicine

## 2021-02-22 MED ORDER — METOPROLOL SUCCINATE ER 50 MG PO TB24
ORAL_TABLET | ORAL | 0 refills | Status: DC
Start: 2021-02-22 — End: 2021-06-01

## 2021-02-22 MED ORDER — LEVOTHYROXINE SODIUM 50 MCG PO TABS
ORAL_TABLET | ORAL | 0 refills | Status: DC
Start: 2021-02-22 — End: 2021-06-01

## 2021-02-22 MED ORDER — TRIAMTERENE-HCTZ 37.5-25 MG PO TABS
1.0000 | ORAL_TABLET | Freq: Every day | ORAL | 0 refills | Status: DC
Start: 2021-02-22 — End: 2021-06-01

## 2021-03-12 ENCOUNTER — Ambulatory Visit: Payer: PPO

## 2021-04-07 ENCOUNTER — Ambulatory Visit (INDEPENDENT_AMBULATORY_CARE_PROVIDER_SITE_OTHER): Payer: PPO | Admitting: Internal Medicine

## 2021-04-07 ENCOUNTER — Other Ambulatory Visit: Payer: Self-pay

## 2021-04-07 ENCOUNTER — Encounter: Payer: Self-pay | Admitting: Internal Medicine

## 2021-04-07 ENCOUNTER — Ambulatory Visit (INDEPENDENT_AMBULATORY_CARE_PROVIDER_SITE_OTHER): Payer: PPO

## 2021-04-07 VITALS — BP 124/76 | HR 83 | Temp 97.9°F | Ht 63.0 in | Wt 215.0 lb

## 2021-04-07 DIAGNOSIS — N1831 Chronic kidney disease, stage 3a: Secondary | ICD-10-CM

## 2021-04-07 DIAGNOSIS — E559 Vitamin D deficiency, unspecified: Secondary | ICD-10-CM | POA: Diagnosis not present

## 2021-04-07 DIAGNOSIS — D229 Melanocytic nevi, unspecified: Secondary | ICD-10-CM | POA: Diagnosis not present

## 2021-04-07 DIAGNOSIS — E538 Deficiency of other specified B group vitamins: Secondary | ICD-10-CM | POA: Diagnosis not present

## 2021-04-07 DIAGNOSIS — I10 Essential (primary) hypertension: Secondary | ICD-10-CM

## 2021-04-07 DIAGNOSIS — Z0001 Encounter for general adult medical examination with abnormal findings: Secondary | ICD-10-CM | POA: Diagnosis not present

## 2021-04-07 DIAGNOSIS — R059 Cough, unspecified: Secondary | ICD-10-CM | POA: Diagnosis not present

## 2021-04-07 DIAGNOSIS — Z0000 Encounter for general adult medical examination without abnormal findings: Secondary | ICD-10-CM

## 2021-04-07 DIAGNOSIS — J309 Allergic rhinitis, unspecified: Secondary | ICD-10-CM | POA: Diagnosis not present

## 2021-04-07 DIAGNOSIS — Z23 Encounter for immunization: Secondary | ICD-10-CM

## 2021-04-07 DIAGNOSIS — R053 Chronic cough: Secondary | ICD-10-CM | POA: Diagnosis not present

## 2021-04-07 DIAGNOSIS — R739 Hyperglycemia, unspecified: Secondary | ICD-10-CM | POA: Diagnosis not present

## 2021-04-07 MED ORDER — TRIAMCINOLONE ACETONIDE 55 MCG/ACT NA AERO: 2.0000 | INHALATION_SPRAY | Freq: Every day | NASAL | 12 refills | Status: AC

## 2021-04-07 NOTE — Progress Notes (Signed)
Patient ID: Elizabeth Buckley, female   DOB: 04-19-1951, 70 y.o.   MRN: [id number]         Chief Complaint:: wellness exam and allergies, ckd, skin moles,        HPI:  Elizabeth Buckley is a 70 y.o. female here for wellness exam; due for tdap, needs refer to GYN, also pt to call for eye appt herself, o/w up to date with preventive refferals and immunizations                        Also Does have several wks ongoing nasal allergy symptoms with clearish congestion, itch and sneezing, without fever, pain, ST, cough, swelling or wheezing.  Has numerous skin moles to torso, not sure if any changed, last body check was several years ago, needs referral.  Pt denies chest pain, increased sob or doe, wheezing, orthopnea, PND, increased LE swelling, palpitations, dizziness or syncope.   Pt denies polydipsia, polyuria, denies new focal neuro s/s.    Pt denies fever, wt loss, night sweats, loss of appetite, or other constitutional symptoms  No other new complaints     Wt Readings from Last 3 Encounters:  04/07/21 215 lb (97.5 kg)  04/06/20 206 lb (93.4 kg)  02/05/19 218 lb (98.9 kg)   BP Readings from Last 3 Encounters:  04/07/21 124/76  04/06/20 130/72  02/05/19 110/64   Immunization History  Administered Date(s) Administered  . H1N1 11/24/2008  . Influenza Split 11/03/2011, 11/18/2012  . Influenza Whole 09/21/2010  . Influenza, High Dose Seasonal PF 08/13/2015, 09/27/2017, 02/05/2019  . Influenza,inj,Quad PF,6+ Mos 01/22/2014, 08/07/2014  . PFIZER(Purple Top)SARS-COV-2 Vaccination 02/20/2020, 03/18/2020, 09/21/2020  . Pneumococcal Conjugate-13 08/27/2015  . Pneumococcal Polysaccharide-23 08/12/2016  . Td 09/21/2010  . Tdap 04/07/2021  . Zoster Recombinat (Shingrix) 01/25/2021   There are no preventive care reminders to display for this patient.    Past Medical History:  Diagnosis Date  . Anemia   . ANXIETY   . BACK PAIN   . Cataract   . CONSTIPATION, CHRONIC   . DEPRESSION   . GERD  (gastroesophageal reflux disease)   . HYPERLIPIDEMIA   . HYPERTENSION   . NASH (NON-ALCOHOLIC STEATOHEPATITIS)   . Obesity, unspecified   . OSTEOPENIA   . Pneumonia    x3   Past Surgical History:  Procedure Laterality Date  . ABDOMINAL HYSTERECTOMY    . APPENDECTOMY    . BLADDER SUSPENSION  01/18/2012   Procedure: TRANSVAGINAL TAPE (TVT) PROCEDURE;  Surgeon: Daria Pastures, MD;  Location: [facility];  Service: Gynecology;  Laterality: N/A;  . BREAST BIOPSY Right   . CATARACT EXTRACTION     both eyes  . cataract surgery     left and right eye  . CHOLECYSTECTOMY    . COLONOSCOPY W/ BIOPSIES AND POLYPECTOMY  01/03/2003   submucosal lesion  . CYSTOCELE REPAIR  01/18/2012   Procedure: ANTERIOR REPAIR (CYSTOCELE);  Surgeon: Daria Pastures, MD;  Location: [facility];  Service: Gynecology;  Laterality: N/A;  . CYSTOSCOPY  01/18/2012   Procedure: CYSTOSCOPY;  Surgeon: Daria Pastures, MD;  Location: [facility];  Service: Gynecology;  Laterality: N/A;  . ELECTROCARDIOGRAM  07/27/2006  . s/p liver biopsy     Dr. Carlean Purl  . s/p lumbar surgery    . trigger finger repair left thumb    . TUBAL LIGATION    . UPPER GASTROINTESTINAL ENDOSCOPY  01/03/2003    reports that she quit  smoking about 20 years ago. She has never used smokeless tobacco. She reports current alcohol use. She reports that she does not use drugs. family history includes Alcohol abuse in an other family member; Heart disease in her father; Hyperlipidemia in an other family member; Hypertension in an other family member. Allergies  Allergen Reactions  . Codeine Nausea Only   Current Outpatient Medications on File Prior to Visit  Medication Sig Dispense Refill  . albuterol (PROVENTIL HFA;VENTOLIN HFA) 108 (90 Base) MCG/ACT inhaler Inhale 2 puffs into the lungs every 6 (six) hours as needed for wheezing or shortness of breath. 1 Inhaler 2  . ALPRAZolam (XANAX) 0.25 MG tablet Take 1 tablet (0.25 mg total) by mouth 2 (two) times daily  as needed. for anxiety 60 tablet 5  . aspirin 81 MG tablet Take 81 mg by mouth daily.    . Cholecalciferol (VITAMIN D3) 1000 UNITS CAPS Take 5,000 Units by mouth daily.    Marland Kitchen ibuprofen (ADVIL,MOTRIN) 200 MG tablet Take 400 mg by mouth daily.    Marland Kitchen levothyroxine (EUTHYROX) 50 MCG tablet TAKE 1 TABLET BY MOUTH ONCE DAILY 90 tablet 0  . meclizine (ANTIVERT) 12.5 MG tablet Take 1 tablet (12.5 mg total) by mouth 3 (three) times daily as needed. 30 tablet 2  . metoprolol succinate (TOPROL-XL) 50 MG 24 hr tablet Take with or immediately following a meal. 90 tablet 0  . polyethylene glycol (MIRALAX / GLYCOLAX) packet Take 17 g by mouth daily as needed. For constipation 14 each 6  . rosuvastatin (CRESTOR) 40 MG tablet Take 1 tablet (40 mg total) by mouth daily. 90 tablet 3  . traMADol (ULTRAM) 50 MG tablet Take 1 tablet (50 mg total) by mouth every 6 (six) hours as needed. 30 tablet 0  . triamterene-hydrochlorothiazide (MAXZIDE-25) 37.5-25 MG tablet Take 1 tablet by mouth daily. 90 tablet 0  . venlafaxine XR (EFFEXOR-XR) 150 MG 24 hr capsule TAKE 1 CAPSULE BY MOUTH ONCE DAILY WITH BREAKFAST 90 capsule 3   No current facility-administered medications on file prior to visit.        ROS:  All others reviewed and negative.  Objective        PE:  BP 124/76 (BP Location: Left Arm, Patient Position: Sitting, Cuff Size: Large)   Pulse 83   Temp 97.9 F (36.6 C) (Oral)   Ht 5\' 3"  (1.6 m)   Wt 215 lb (97.5 kg)   SpO2 95%   BMI 38.09 kg/m                 Constitutional: Pt appears in NAD               HENT: Head: NCAT.                Right Ear: External ear normal.                 Left Ear: External ear normal.                Eyes: . Pupils are equal, round, and reactive to light. Conjunctivae and EOM are normal               Nose: without d/c or deformity               Neck: Neck supple. Gross normal ROM               Cardiovascular: Normal rate and regular rhythm.  Pulmonary/Chest:  Effort normal and breath sounds without rales or wheezing.                Abd:  Soft, NT, ND, + BS, no organomegaly               Neurological: Pt is alert. At baseline orientation, motor grossly intact               Skin: Skin is warm. No rashes, no other new lesions, LE edema - none               Psychiatric: Pt behavior is normal without agitation   Micro: none  Cardiac tracings I have personally interpreted today:  none  Pertinent Radiological findings (summarize): none   Lab Results  Component Value Date   WBC 8.6 04/07/2021   HGB 13.4 04/07/2021   HCT 40.3 04/07/2021   PLT 289.0 04/07/2021   GLUCOSE 80 04/07/2021   CHOL 161 04/07/2021   TRIG 173.0 (H) 04/07/2021   HDL 63.80 04/07/2021   LDLDIRECT 114.0 04/06/2020   LDLCALC 63 04/07/2021   ALT 16 04/07/2021   AST 31 04/07/2021   NA 131 (L) 04/07/2021   K 3.3 (L) 04/07/2021   CL 88 (L) 04/07/2021   CREATININE 1.15 04/07/2021   BUN 14 04/07/2021   CO2 33 (H) 04/07/2021   TSH 2.17 04/07/2021   INR 1.0 11/10/2008   HGBA1C 5.8 04/07/2021   Assessment/Plan:  Elizabeth Buckley is a 70 y.o. White or Caucasian [1] female with  has a past medical history of Anemia, ANXIETY, BACK PAIN, Cataract, CONSTIPATION, CHRONIC, DEPRESSION, GERD (gastroesophageal reflux disease), HYPERLIPIDEMIA, HYPERTENSION, NASH (NON-ALCOHOLIC STEATOHEPATITIS), Obesity, unspecified, OSTEOPENIA, and Pneumonia.  Encounter for well adult exam with abnormal findings Age and sex appropriate education and counseling updated with regular exercise and diet Referrals for preventative services - for gyn referral, pt to call for eye appt herself Immunizations addressed - for tdap Smoking counseling  - none needed Evidence for depression or other mood disorder - none significant Most recent labs reviewed. I have personally reviewed and have noted: 1) the patient's medical and social history 2) The patient's current medications and supplements 3) The patient's  height, weight, and BMI have been recorded in the chart   Allergic rhinitis Mild to mod, for nasacort asd,  to f/u any worsening symptoms or concerns  Essential hypertension BP Readings from Last 3 Encounters:  04/07/21 124/76  04/06/20 130/72  02/05/19 110/64   Stable, pt to continue medical treatment maxide, toprol   CKD (chronic kidney disease) Lab Results  Component Value Date   CREATININE 1.15 04/07/2021   Stable overall, cont to avoid nephrotoxins   Hyperglycemia Lab Results  Component Value Date   HGBA1C 5.8 04/07/2021   Stable, pt to continue current medical treatment  - diet   Numerous skin moles Also for derm referral,  to f/u any worsening symptoms or concerns  Followup: Return in about 6 months (around 10/07/2021).  Elizabeth Cower, MD 04/11/2021 5:15 PM [facility]

## 2021-04-07 NOTE — Patient Instructions (Addendum)
You had the Tdap tetanus shot today  You will be contacted regarding the referral for: GYN and Dermatology (Dr Allyson Sabal)  Please remember to call for your yearly eye exam  Please take all new medication as prescribed - the OTC Nasacort for allergies  Please continue all other medications as before, and refills have been done if requested.  Please have the pharmacy call with any other refills you may need.  Please continue your efforts at being more active, low cholesterol diet, and weight control.  You are otherwise up to date with prevention measures today.  Please keep your appointments with your specialists as you may have planned  Please go to the XRAY Department in the first floor for the x-ray testing (due to the cough)  Please go to the LAB at the blood drawing area for the tests to be done  You will be contacted by phone if any changes need to be made immediately.  Otherwise, you will receive a letter about your results with an explanation, but please check with MyChart first.  Please remember to sign up for MyChart if you have not done so, as this will be important to you in the future with finding out test results, communicating by private email, and scheduling acute appointments online when needed.  Please make an Appointment to return in 6 months, or sooner if needed, to follow up on the slower kidneys

## 2021-04-08 ENCOUNTER — Encounter: Payer: Self-pay | Admitting: Internal Medicine

## 2021-04-08 ENCOUNTER — Other Ambulatory Visit: Payer: Self-pay | Admitting: Internal Medicine

## 2021-04-08 LAB — CBC WITH DIFFERENTIAL/PLATELET
Basophils Absolute: 0.1 10*3/uL (ref 0.0–0.1)
Basophils Relative: 1.2 % (ref 0.0–3.0)
Eosinophils Absolute: 0.6 10*3/uL (ref 0.0–0.7)
Eosinophils Relative: 6.4 % — ABNORMAL HIGH (ref 0.0–5.0)
HCT: 40.3 % (ref 36.0–46.0)
Hemoglobin: 13.4 g/dL (ref 12.0–15.0)
Lymphocytes Relative: 38.6 % (ref 12.0–46.0)
Lymphs Abs: 3.3 10*3/uL (ref 0.7–4.0)
MCHC: 33.2 g/dL (ref 30.0–36.0)
MCV: 81.7 fl (ref 78.0–100.0)
Monocytes Absolute: 0.6 10*3/uL (ref 0.1–1.0)
Monocytes Relative: 6.9 % (ref 3.0–12.0)
Neutro Abs: 4 10*3/uL (ref 1.4–7.7)
Neutrophils Relative %: 46.9 % (ref 43.0–77.0)
Platelets: 289 10*3/uL (ref 150.0–400.0)
RBC: 4.93 Mil/uL (ref 3.87–5.11)
RDW: 16.7 % — ABNORMAL HIGH (ref 11.5–15.5)
WBC: 8.6 10*3/uL (ref 4.0–10.5)

## 2021-04-08 LAB — VITAMIN D 25 HYDROXY (VIT D DEFICIENCY, FRACTURES): VITD: 43.52 ng/mL (ref 30.00–100.00)

## 2021-04-08 LAB — URINALYSIS, ROUTINE W REFLEX MICROSCOPIC
Bilirubin Urine: NEGATIVE
Hgb urine dipstick: NEGATIVE
Ketones, ur: NEGATIVE
Nitrite: NEGATIVE
RBC / HPF: NONE SEEN (ref 0–?)
Specific Gravity, Urine: 1.01 (ref 1.000–1.030)
Total Protein, Urine: NEGATIVE
Urine Glucose: NEGATIVE
Urobilinogen, UA: 1 (ref 0.0–1.0)
pH: 6.5 (ref 5.0–8.0)

## 2021-04-08 LAB — LIPID PANEL
Cholesterol: 161 mg/dL (ref 0–200)
HDL: 63.8 mg/dL (ref >39.00)
LDL Cholesterol: 63 mg/dL (ref 0–99)
NonHDL: 97.24
Total CHOL/HDL Ratio: 3
Triglycerides: 173 mg/dL — ABNORMAL HIGH (ref 0.0–149.0)
VLDL: 34.6 mg/dL (ref 0.0–40.0)

## 2021-04-08 LAB — HEPATIC FUNCTION PANEL
ALT: 16 U/L (ref 0–35)
AST: 31 U/L (ref 0–37)
Albumin: 4 g/dL (ref 3.5–5.2)
Alkaline Phosphatase: 75 U/L (ref 39–117)
Bilirubin, Direct: 0.1 mg/dL (ref 0.0–0.3)
Total Bilirubin: 0.6 mg/dL (ref 0.2–1.2)
Total Protein: 7.9 g/dL (ref 6.0–8.3)

## 2021-04-08 LAB — VITAMIN B12: Vitamin B-12: 1203 pg/mL — ABNORMAL HIGH (ref 211–911)

## 2021-04-08 LAB — BASIC METABOLIC PANEL
BUN: 14 mg/dL (ref 6–23)
CO2: 33 mEq/L — ABNORMAL HIGH (ref 19–32)
Calcium: 10.6 mg/dL — ABNORMAL HIGH (ref 8.4–10.5)
Chloride: 88 mEq/L — ABNORMAL LOW (ref 96–112)
Creatinine, Ser: 1.15 mg/dL (ref 0.40–1.20)
GFR: 48.18 mL/min — ABNORMAL LOW (ref >60.00)
Glucose, Bld: 80 mg/dL (ref 70–99)
Potassium: 3.3 mEq/L — ABNORMAL LOW (ref 3.5–5.1)
Sodium: 131 mEq/L — ABNORMAL LOW (ref 135–145)

## 2021-04-08 LAB — HEMOGLOBIN A1C: Hgb A1c MFr Bld: 5.8 % (ref 4.6–6.5)

## 2021-04-08 LAB — TSH: TSH: 2.17 u[IU]/mL (ref 0.35–4.50)

## 2021-04-08 MED ORDER — POTASSIUM CHLORIDE ER 10 MEQ PO TBCR: 10.0000 meq | EXTENDED_RELEASE_TABLET | Freq: Every day | ORAL | 3 refills | Status: DC

## 2021-04-11 ENCOUNTER — Encounter: Payer: Self-pay | Admitting: Internal Medicine

## 2021-04-11 DIAGNOSIS — J309 Allergic rhinitis, unspecified: Secondary | ICD-10-CM | POA: Insufficient documentation

## 2021-04-11 DIAGNOSIS — R739 Hyperglycemia, unspecified: Secondary | ICD-10-CM | POA: Insufficient documentation

## 2021-04-11 DIAGNOSIS — N189 Chronic kidney disease, unspecified: Secondary | ICD-10-CM | POA: Insufficient documentation

## 2021-04-11 DIAGNOSIS — D229 Melanocytic nevi, unspecified: Secondary | ICD-10-CM | POA: Insufficient documentation

## 2021-04-11 NOTE — Assessment & Plan Note (Signed)
Mild to mod, for nasacort asd,  to f/u any worsening symptoms or concerns 

## 2021-04-11 NOTE — Assessment & Plan Note (Signed)
Age and sex appropriate education and counseling updated with regular exercise and diet Referrals for preventative services - for gyn referral, pt to call for eye appt herself Immunizations addressed - for tdap Smoking counseling  - none needed Evidence for depression or other mood disorder - none significant Most recent labs reviewed. I have personally reviewed and have noted: 1) the patient's medical and social history 2) The patient's current medications and supplements 3) The patient's height, weight, and BMI have been recorded in the chart

## 2021-04-11 NOTE — Assessment & Plan Note (Signed)
Also for derm referral,  to f/u any worsening symptoms or concerns

## 2021-04-11 NOTE — Assessment & Plan Note (Signed)
Lab Results  Component Value Date   HGBA1C 5.8 04/07/2021   Stable, pt to continue current medical treatment  - diet

## 2021-04-11 NOTE — Assessment & Plan Note (Signed)
Lab Results  Component Value Date   CREATININE 1.15 04/07/2021   Stable overall, cont to avoid nephrotoxins

## 2021-04-11 NOTE — Assessment & Plan Note (Signed)
BP Readings from Last 3 Encounters:  04/07/21 124/76  04/06/20 130/72  02/05/19 110/64   Stable, pt to continue medical treatment maxide, toprol

## 2021-04-12 ENCOUNTER — Telehealth: Payer: Self-pay | Admitting: Internal Medicine

## 2021-04-12 MED ORDER — VENLAFAXINE HCL ER 150 MG PO CP24: 150.0000 mg | ORAL_CAPSULE | Freq: Every day | ORAL | 3 refills | Status: DC

## 2021-04-12 NOTE — Telephone Encounter (Signed)
venlafaxine XR (EFFEXOR-XR) 150 MG 24 hr capsule [address], [place] Phone:  [phone]  Fax:  [phone]     Last seen-04.20.22 Next apt- 10.20.22 Patient is completely out requesting a refill

## 2021-04-19 ENCOUNTER — Telehealth: Payer: Self-pay | Admitting: Internal Medicine

## 2021-04-19 ENCOUNTER — Other Ambulatory Visit: Payer: Self-pay

## 2021-04-19 MED ORDER — ROSUVASTATIN CALCIUM 40 MG PO TABS: 40.0000 mg | ORAL_TABLET | Freq: Every day | ORAL | 3 refills | Status: DC

## 2021-04-19 NOTE — Telephone Encounter (Signed)
1.Medication Requested: rosuvastatin (CRESTOR) 40 MG tablet    2. Pharmacy (Name, [address], [place]): [place], [id number]  3. On Med List: yes   4. Last Visit with PCP: 04-07-21  5. Next visit date with PCP: 10-07-21   Agent: Please be advised that RX refills may take up to 3 business days. We ask that you follow-up with your pharmacy.

## 2021-04-30 ENCOUNTER — Ambulatory Visit
Admission: RE | Admit: 2021-04-30 | Discharge: 2021-04-30 | Disposition: A | Discharge status: Home / Self Care | Payer: PPO | Source: Ambulatory Visit | Attending: Internal Medicine | Admitting: Internal Medicine

## 2021-04-30 ENCOUNTER — Other Ambulatory Visit: Payer: Self-pay

## 2021-04-30 DIAGNOSIS — Z1231 Encounter for screening mammogram for malignant neoplasm of breast: Secondary | ICD-10-CM

## 2021-06-01 ENCOUNTER — Other Ambulatory Visit: Payer: Self-pay | Admitting: Internal Medicine

## 2021-06-01 MED ORDER — TRIAMTERENE-HCTZ 37.5-25 MG PO TABS: 1.0000 | ORAL_TABLET | Freq: Every day | ORAL | 3 refills | Status: DC

## 2021-06-01 MED ORDER — METOPROLOL SUCCINATE ER 50 MG PO TB24: ORAL_TABLET | ORAL | 3 refills | Status: DC

## 2021-06-01 MED ORDER — LEVOTHYROXINE SODIUM 50 MCG PO TABS: ORAL_TABLET | ORAL | 3 refills | Status: DC

## 2021-08-06 ENCOUNTER — Encounter: Payer: Self-pay | Admitting: Obstetrics & Gynecology

## 2021-08-18 ENCOUNTER — Encounter: Payer: Self-pay | Admitting: Obstetrics & Gynecology

## 2021-08-25 ENCOUNTER — Encounter: Payer: PPO | Admitting: Obstetrics & Gynecology

## 2021-10-07 ENCOUNTER — Ambulatory Visit: Payer: PPO | Admitting: Internal Medicine

## 2021-10-13 ENCOUNTER — Ambulatory Visit: Payer: PPO | Admitting: Internal Medicine

## 2021-10-14 ENCOUNTER — Encounter: Payer: PPO | Admitting: Obstetrics and Gynecology

## 2021-10-15 ENCOUNTER — Other Ambulatory Visit: Payer: Self-pay

## 2021-10-15 ENCOUNTER — Encounter: Payer: Self-pay | Admitting: Internal Medicine

## 2021-10-15 ENCOUNTER — Ambulatory Visit: Payer: PPO

## 2021-10-15 ENCOUNTER — Ambulatory Visit (INDEPENDENT_AMBULATORY_CARE_PROVIDER_SITE_OTHER): Payer: PPO | Admitting: Internal Medicine

## 2021-10-15 VITALS — BP 110/72 | HR 90 | Ht 63.0 in | Wt 207.0 lb

## 2021-10-15 DIAGNOSIS — E669 Obesity, unspecified: Secondary | ICD-10-CM

## 2021-10-15 DIAGNOSIS — E538 Deficiency of other specified B group vitamins: Secondary | ICD-10-CM

## 2021-10-15 DIAGNOSIS — N1831 Chronic kidney disease, stage 3a: Secondary | ICD-10-CM | POA: Diagnosis not present

## 2021-10-15 DIAGNOSIS — R739 Hyperglycemia, unspecified: Secondary | ICD-10-CM | POA: Diagnosis not present

## 2021-10-15 DIAGNOSIS — Z23 Encounter for immunization: Secondary | ICD-10-CM | POA: Diagnosis not present

## 2021-10-15 DIAGNOSIS — I10 Essential (primary) hypertension: Secondary | ICD-10-CM | POA: Diagnosis not present

## 2021-10-15 DIAGNOSIS — E559 Vitamin D deficiency, unspecified: Secondary | ICD-10-CM | POA: Diagnosis not present

## 2021-10-15 DIAGNOSIS — E039 Hypothyroidism, unspecified: Secondary | ICD-10-CM | POA: Diagnosis not present

## 2021-10-15 DIAGNOSIS — E785 Hyperlipidemia, unspecified: Secondary | ICD-10-CM

## 2021-10-15 DIAGNOSIS — E876 Hypokalemia: Secondary | ICD-10-CM

## 2021-10-15 LAB — HEMOGLOBIN A1C: Hgb A1c MFr Bld: 5.3 % (ref 4.6–6.5)

## 2021-10-15 LAB — LIPID PANEL
Cholesterol: 183 mg/dL (ref 0–200)
HDL: 56.5 mg/dL (ref >39.00)
NonHDL: 126.02
Total CHOL/HDL Ratio: 3
Triglycerides: 248 mg/dL — ABNORMAL HIGH (ref 0.0–149.0)
VLDL: 49.6 mg/dL — ABNORMAL HIGH (ref 0.0–40.0)

## 2021-10-15 LAB — BASIC METABOLIC PANEL
BUN: 12 mg/dL (ref 6–23)
CO2: 32 mEq/L (ref 19–32)
Calcium: 10.7 mg/dL — ABNORMAL HIGH (ref 8.4–10.5)
Chloride: 95 mEq/L — ABNORMAL LOW (ref 96–112)
Creatinine, Ser: 1.03 mg/dL (ref 0.40–1.20)
GFR: 54.79 mL/min — ABNORMAL LOW (ref >60.00)
Glucose, Bld: 108 mg/dL — ABNORMAL HIGH (ref 70–99)
Potassium: 3.4 mEq/L — ABNORMAL LOW (ref 3.5–5.1)
Sodium: 135 mEq/L (ref 135–145)

## 2021-10-15 LAB — HEPATIC FUNCTION PANEL
ALT: 15 U/L (ref 0–35)
AST: 23 U/L (ref 0–37)
Albumin: 4.5 g/dL (ref 3.5–5.2)
Alkaline Phosphatase: 77 U/L (ref 39–117)
Bilirubin, Direct: 0.1 mg/dL (ref 0.0–0.3)
Total Bilirubin: 0.6 mg/dL (ref 0.2–1.2)
Total Protein: 8 g/dL (ref 6.0–8.3)

## 2021-10-15 LAB — LDL CHOLESTEROL, DIRECT: Direct LDL: 99 mg/dL

## 2021-10-15 NOTE — Progress Notes (Signed)
Patient ID: Elizabeth Buckley, female   DOB: 1951-02-10, 70 y.o.   MRN: [id number]        Chief Complaint: follow up hypothyoridi, hld, htn, hyperglycemia, ckd , obeisty       HPI:  Elizabeth Buckley is a 70 y.o. female here overall doing well. Pt denies chest pain, increased sob or doe, wheezing, orthopnea, PND, increased LE swelling, palpitations, dizziness or syncope.   Pt denies polydipsia, polyuria, or new focal neuro s/s.  Frustrated with unable to lose wt, just seems to be up and down, but more up .    Pt denies fever, wt loss, night sweats, loss of appetite, or other constitutional symptoms  Denies hyper or hypo thyroid symptoms such as voice, skin or hair change. Trying to follow lower chol diet.  Due for flu shot      Wt Readings from Last 3 Encounters:  10/15/21 207 lb (93.9 kg)  04/07/21 215 lb (97.5 kg)  04/06/20 206 lb (93.4 kg)   BP Readings from Last 3 Encounters:  10/15/21 110/72  04/07/21 124/76  04/06/20 130/72         Past Medical History:  Diagnosis Date   Anemia    ANXIETY    BACK PAIN    Cataract    CONSTIPATION, CHRONIC    DEPRESSION    GERD (gastroesophageal reflux disease)    HYPERLIPIDEMIA    HYPERTENSION    NASH (NON-ALCOHOLIC STEATOHEPATITIS)    Obesity, unspecified    OSTEOPENIA    Pneumonia    x3   Past Surgical History:  Procedure Laterality Date   ABDOMINAL HYSTERECTOMY     APPENDECTOMY     BLADDER SUSPENSION  01/18/2012   Procedure: TRANSVAGINAL TAPE (TVT) PROCEDURE;  Surgeon: Daria Pastures, MD;  Location: [facility];  Service: Gynecology;  Laterality: N/A;   BREAST BIOPSY Right    CATARACT EXTRACTION     both eyes   cataract surgery     left and right eye   CHOLECYSTECTOMY     COLONOSCOPY W/ BIOPSIES AND POLYPECTOMY  01/03/2003   submucosal lesion   CYSTOCELE REPAIR  01/18/2012   Procedure: ANTERIOR REPAIR (CYSTOCELE);  Surgeon: Daria Pastures, MD;  Location: [facility];  Service: Gynecology;  Laterality: N/A;   CYSTOSCOPY  01/18/2012    Procedure: CYSTOSCOPY;  Surgeon: Daria Pastures, MD;  Location: [facility];  Service: Gynecology;  Laterality: N/A;   ELECTROCARDIOGRAM  07/27/2006   s/p liver biopsy     Dr. Carlean Purl   s/p lumbar surgery     trigger finger repair left thumb     TUBAL LIGATION     UPPER GASTROINTESTINAL ENDOSCOPY  01/03/2003    reports that she quit smoking about 20 years ago. Her smoking use included cigarettes. She has never used smokeless tobacco. She reports current alcohol use. She reports that she does not use drugs. family history includes Alcohol abuse in an other family member; Heart disease in her father; Hyperlipidemia in an other family member; Hypertension in an other family member. Allergies  Allergen Reactions   Codeine Nausea Only   Current Outpatient Medications on File Prior to Visit  Medication Sig Dispense Refill   albuterol (PROVENTIL HFA;VENTOLIN HFA) 108 (90 Base) MCG/ACT inhaler Inhale 2 puffs into the lungs every 6 (six) hours as needed for wheezing or shortness of breath. 1 Inhaler 2   ALPRAZolam (XANAX) 0.25 MG tablet Take 1 tablet (0.25 mg total) by mouth 2 (two) times daily as needed.  for anxiety 60 tablet 5   aspirin 81 MG tablet Take 81 mg by mouth daily.     Cholecalciferol (VITAMIN D3) 1000 UNITS CAPS Take 5,000 Units by mouth daily.     ibuprofen (ADVIL,MOTRIN) 200 MG tablet Take 400 mg by mouth daily.     levothyroxine (EUTHYROX) 50 MCG tablet TAKE 1 TABLET BY MOUTH ONCE DAILY 90 tablet 3   meclizine (ANTIVERT) 12.5 MG tablet Take 1 tablet (12.5 mg total) by mouth 3 (three) times daily as needed. 30 tablet 2   metoprolol succinate (TOPROL-XL) 50 MG 24 hr tablet Take with or immediately following a meal. 90 tablet 3   polyethylene glycol (MIRALAX / GLYCOLAX) packet Take 17 g by mouth daily as needed. For constipation 14 each 6   potassium chloride (KLOR-CON 10) 10 MEQ tablet Take 1 tablet (10 mEq total) by mouth daily. Must keep appt for future refills 90 tablet 3    rosuvastatin (CRESTOR) 40 MG tablet Take 1 tablet (40 mg total) by mouth daily. 90 tablet 3   traMADol (ULTRAM) 50 MG tablet Take 1 tablet (50 mg total) by mouth every 6 (six) hours as needed. 30 tablet 0   triamcinolone (NASACORT) 55 MCG/ACT AERO nasal inhaler Place 2 sprays into the nose daily. 1 each 12   triamterene-hydrochlorothiazide (MAXZIDE-25) 37.5-25 MG tablet Take 1 tablet by mouth daily. 90 tablet 3   venlafaxine XR (EFFEXOR-XR) 150 MG 24 hr capsule Take 1 capsule (150 mg total) by mouth daily with breakfast. 90 capsule 3   No current facility-administered medications on file prior to visit.        ROS:  All others reviewed and negative.  Objective        PE:  BP 110/72 (BP Location: Right Arm, Patient Position: Sitting, Cuff Size: Large)   Pulse 90   Ht 5\' 3"  (1.6 m)   Wt 207 lb (93.9 kg)   SpO2 96%   BMI 36.67 kg/m                 Constitutional: Pt appears in NAD               HENT: Head: NCAT.                Right Ear: External ear normal.                 Left Ear: External ear normal.                Eyes: . Pupils are equal, round, and reactive to light. Conjunctivae and EOM are normal               Nose: without d/c or deformity               Neck: Neck supple. Gross normal ROM               Cardiovascular: Normal rate and regular rhythm.                 Pulmonary/Chest: Effort normal and breath sounds without rales or wheezing.                Abd:  Soft, NT, ND, + BS, no organomegaly               Neurological: Pt is alert. At baseline orientation, motor grossly intact               Skin: Skin is warm. No rashes, no  other new lesions, LE edema - none               Psychiatric: Pt behavior is normal without agitation   Micro: none  Cardiac tracings I have personally interpreted today:  none  Pertinent Radiological findings (summarize): none   Lab Results  Component Value Date   WBC 8.6 04/07/2021   HGB 13.4 04/07/2021   HCT 40.3 04/07/2021   PLT 289.0  04/07/2021   GLUCOSE 108 (H) 10/15/2021   CHOL 183 10/15/2021   TRIG 248.0 (H) 10/15/2021   HDL 56.50 10/15/2021   LDLDIRECT 99.0 10/15/2021   LDLCALC 63 04/07/2021   ALT 15 10/15/2021   AST 23 10/15/2021   NA 135 10/15/2021   K 3.4 (L) 10/15/2021   CL 95 (L) 10/15/2021   CREATININE 1.03 10/15/2021   BUN 12 10/15/2021   CO2 32 10/15/2021   TSH 2.17 04/07/2021   INR 1.0 11/10/2008   HGBA1C 5.3 10/15/2021   Assessment/Plan:  Elizabeth Buckley is a 70 y.o. White or Caucasian [1] female with  has a past medical history of Anemia, ANXIETY, BACK PAIN, Cataract, CONSTIPATION, CHRONIC, DEPRESSION, GERD (gastroesophageal reflux disease), HYPERLIPIDEMIA, HYPERTENSION, NASH (NON-ALCOHOLIC STEATOHEPATITIS), Obesity, unspecified, OSTEOPENIA, and Pneumonia.  Hypothyroidism Lab Results  Component Value Date   TSH 2.17 04/07/2021   Stable, pt to continue levothyroxine   Hyperlipidemia Lab Results  Component Value Date   LDLCALC 63 04/07/2021   Stable, pt to continue current statin crestor 40   Essential hypertension BP Readings from Last 3 Encounters:  10/15/21 110/72  04/07/21 124/76  04/06/20 130/72   Stable, pt to continue medical treatment  - toprol, maxide   Hyperglycemia Lab Results  Component Value Date   HGBA1C 5.3 10/15/2021   Stable, pt to continue current medical treatment  - diet   CKD (chronic kidney disease) Lab Results  Component Value Date   CREATININE 1.03 10/15/2021   Stable overall, cont to avoid nephrotoxins   Obesity (BMI 30-39.9) Wt Readings from Last 3 Encounters:  10/15/21 207 lb (93.9 kg)  04/07/21 215 lb (97.5 kg)  04/06/20 206 lb (93.4 kg)  uncontrolled, resistant to wt loss, for referral wt management clinic, likely good candidate for monjouro  Followup: Return in about 6 months (around 04/15/2022).  Cathlean Cower, MD 10/17/2021 8:56 AM [facility]

## 2021-10-15 NOTE — Patient Instructions (Addendum)
You had the flu shot today  You will be contacted regarding the referral for: weight management  Please continue all other medications as before, and refills have been done if requested.  Please have the pharmacy call with any other refills you may need.  Please continue your efforts at being more active, low cholesterol diet, and weight control.  Please keep your appointments with your specialists as you may have planned  Please go to the LAB at the blood drawing area for the tests to be done  You will be contacted by phone if any changes need to be made immediately.  Otherwise, you will receive a letter about your results with an explanation, but please check with MyChart first.  Please remember to sign up for MyChart if you have not done so, as this will be important to you in the future with finding out test results, communicating by private email, and scheduling acute appointments online when needed.  Please make an Appointment to return in 6 months, or sooner if needed

## 2021-10-17 ENCOUNTER — Encounter: Payer: Self-pay | Admitting: Internal Medicine

## 2021-10-17 DIAGNOSIS — E669 Obesity, unspecified: Secondary | ICD-10-CM | POA: Insufficient documentation

## 2021-10-17 NOTE — Assessment & Plan Note (Signed)
BP Readings from Last 3 Encounters:  10/15/21 110/72  04/07/21 124/76  04/06/20 130/72   Stable, pt to continue medical treatment  - toprol, maxide

## 2021-10-17 NOTE — Assessment & Plan Note (Signed)
Wt Readings from Last 3 Encounters:  10/15/21 207 lb (93.9 kg)  04/07/21 215 lb (97.5 kg)  04/06/20 206 lb (93.4 kg)  uncontrolled, resistant to wt loss, for referral wt management clinic, likely good candidate for monjouro

## 2021-10-17 NOTE — Assessment & Plan Note (Signed)
Lab Results  Component Value Date   TSH 2.17 04/07/2021   Stable, pt to continue levothyroxine

## 2021-10-17 NOTE — Assessment & Plan Note (Signed)
Lab Results  Component Value Date   LDLCALC 63 04/07/2021   Stable, pt to continue current statin crestor 40

## 2021-10-17 NOTE — Assessment & Plan Note (Signed)
Lab Results  Component Value Date   CREATININE 1.03 10/15/2021   Stable overall, cont to avoid nephrotoxins

## 2021-10-17 NOTE — Assessment & Plan Note (Signed)
Lab Results  Component Value Date   HGBA1C 5.3 10/15/2021   Stable, pt to continue current medical treatment  - diet

## 2022-04-05 ENCOUNTER — Other Ambulatory Visit: Payer: Self-pay | Admitting: Internal Medicine

## 2022-04-05 DIAGNOSIS — Z1231 Encounter for screening mammogram for malignant neoplasm of breast: Secondary | ICD-10-CM

## 2022-04-08 ENCOUNTER — Telehealth: Payer: Self-pay

## 2022-04-08 MED ORDER — VENLAFAXINE HCL ER 150 MG PO CP24: 150.0000 mg | ORAL_CAPSULE | Freq: Every day | ORAL | 0 refills | Status: DC

## 2022-04-08 NOTE — Telephone Encounter (Signed)
Pt is requesting a refill on: ?venlafaxine XR (EFFEXOR-XR) 150 MG 24 hr capsule ? ?Pharmacy: ?[place], [id number] ? ?LOV 10/15/21 ?ROV 04/20/22 ?

## 2022-04-19 DIAGNOSIS — H524 Presbyopia: Secondary | ICD-10-CM | POA: Diagnosis not present

## 2022-04-19 DIAGNOSIS — Z961 Presence of intraocular lens: Secondary | ICD-10-CM | POA: Diagnosis not present

## 2022-04-19 DIAGNOSIS — H04123 Dry eye syndrome of bilateral lacrimal glands: Secondary | ICD-10-CM | POA: Diagnosis not present

## 2022-04-20 ENCOUNTER — Ambulatory Visit: Payer: PPO | Admitting: Internal Medicine

## 2022-04-21 ENCOUNTER — Encounter: Payer: Self-pay | Admitting: Internal Medicine

## 2022-04-21 ENCOUNTER — Other Ambulatory Visit: Payer: Self-pay | Admitting: Internal Medicine

## 2022-04-21 ENCOUNTER — Ambulatory Visit (INDEPENDENT_AMBULATORY_CARE_PROVIDER_SITE_OTHER): Payer: PPO | Admitting: Internal Medicine

## 2022-04-21 VITALS — BP 118/70 | HR 58 | Temp 98.0°F | Ht 63.0 in | Wt 206.6 lb

## 2022-04-21 DIAGNOSIS — R739 Hyperglycemia, unspecified: Secondary | ICD-10-CM | POA: Diagnosis not present

## 2022-04-21 DIAGNOSIS — Z0001 Encounter for general adult medical examination with abnormal findings: Secondary | ICD-10-CM | POA: Diagnosis not present

## 2022-04-21 DIAGNOSIS — E538 Deficiency of other specified B group vitamins: Secondary | ICD-10-CM

## 2022-04-21 DIAGNOSIS — E785 Hyperlipidemia, unspecified: Secondary | ICD-10-CM

## 2022-04-21 DIAGNOSIS — E559 Vitamin D deficiency, unspecified: Secondary | ICD-10-CM

## 2022-04-21 LAB — CBC WITH DIFFERENTIAL/PLATELET
Basophils Absolute: 0 10*3/uL (ref 0.0–0.1)
Basophils Relative: 0.3 % (ref 0.0–3.0)
Eosinophils Absolute: 0.5 10*3/uL (ref 0.0–0.7)
Eosinophils Relative: 6.3 % — ABNORMAL HIGH (ref 0.0–5.0)
HCT: 42.9 % (ref 36.0–46.0)
Hemoglobin: 14.8 g/dL (ref 12.0–15.0)
Lymphocytes Relative: 51.3 % — ABNORMAL HIGH (ref 12.0–46.0)
Lymphs Abs: 3.7 10*3/uL (ref 0.7–4.0)
MCHC: 34.5 g/dL (ref 30.0–36.0)
MCV: 93.2 fl (ref 78.0–100.0)
Monocytes Absolute: 0.4 10*3/uL (ref 0.1–1.0)
Monocytes Relative: 6.1 % (ref 3.0–12.0)
Neutro Abs: 2.6 10*3/uL (ref 1.4–7.7)
Neutrophils Relative %: 36 % — ABNORMAL LOW (ref 43.0–77.0)
Platelets: 231 10*3/uL (ref 150.0–400.0)
RBC: 4.61 Mil/uL (ref 3.87–5.11)
RDW: 14.2 % (ref 11.5–15.5)
WBC: 7.3 10*3/uL (ref 4.0–10.5)

## 2022-04-21 LAB — LIPID PANEL
Cholesterol: 176 mg/dL (ref 0–200)
HDL: 52.3 mg/dL (ref >39.00)
NonHDL: 123.91
Total CHOL/HDL Ratio: 3
Triglycerides: 263 mg/dL — ABNORMAL HIGH (ref 0.0–149.0)
VLDL: 52.6 mg/dL — ABNORMAL HIGH (ref 0.0–40.0)

## 2022-04-21 LAB — HEPATIC FUNCTION PANEL
ALT: 21 U/L (ref 0–35)
AST: 32 U/L (ref 0–37)
Albumin: 4.2 g/dL (ref 3.5–5.2)
Alkaline Phosphatase: 92 U/L (ref 39–117)
Bilirubin, Direct: 0.1 mg/dL (ref 0.0–0.3)
Total Bilirubin: 0.6 mg/dL (ref 0.2–1.2)
Total Protein: 7.6 g/dL (ref 6.0–8.3)

## 2022-04-21 LAB — BASIC METABOLIC PANEL
BUN: 13 mg/dL (ref 6–23)
CO2: 31 mEq/L (ref 19–32)
Calcium: 10.1 mg/dL (ref 8.4–10.5)
Chloride: 93 mEq/L — ABNORMAL LOW (ref 96–112)
Creatinine, Ser: 0.93 mg/dL (ref 0.40–1.20)
GFR: 61.71 mL/min (ref >60.00)
Glucose, Bld: 88 mg/dL (ref 70–99)
Potassium: 2.9 mEq/L — ABNORMAL LOW (ref 3.5–5.1)
Sodium: 132 mEq/L — ABNORMAL LOW (ref 135–145)

## 2022-04-21 LAB — URINALYSIS, ROUTINE W REFLEX MICROSCOPIC
Bilirubin Urine: NEGATIVE
Hgb urine dipstick: NEGATIVE
Ketones, ur: NEGATIVE
Nitrite: POSITIVE — AB
Specific Gravity, Urine: <=1.005 — AB (ref 1.000–1.030)
Total Protein, Urine: NEGATIVE
Urine Glucose: NEGATIVE
Urobilinogen, UA: 1 (ref 0.0–1.0)
pH: 6 (ref 5.0–8.0)

## 2022-04-21 LAB — HEMOGLOBIN A1C: Hgb A1c MFr Bld: 5.3 % (ref 4.6–6.5)

## 2022-04-21 LAB — TSH: TSH: 4.35 u[IU]/mL (ref 0.35–5.50)

## 2022-04-21 LAB — VITAMIN D 25 HYDROXY (VIT D DEFICIENCY, FRACTURES): VITD: 43.07 ng/mL (ref 30.00–100.00)

## 2022-04-21 LAB — VITAMIN B12: Vitamin B-12: 1284 pg/mL — ABNORMAL HIGH (ref 211–911)

## 2022-04-21 LAB — LDL CHOLESTEROL, DIRECT: Direct LDL: 99 mg/dL

## 2022-04-21 MED ORDER — POTASSIUM CHLORIDE ER 10 MEQ PO TBCR: 10.0000 meq | EXTENDED_RELEASE_TABLET | Freq: Every day | ORAL | 3 refills | Status: DC

## 2022-04-21 NOTE — Patient Instructions (Signed)

## 2022-04-21 NOTE — Progress Notes (Signed)
Patient ID: Elizabeth Buckley, female   DOB: 03/05/1951, 71 y.o.   MRN: [id number] ? ? ? ?     Chief Complaint:: wellness exam and hld, hyperglycemia ? ?     HPI:  Elizabeth Buckley is a 71 y.o. female here for wellness exam ; already up to date ?         ?              Also more stress recently with husband ill in feb 2023; Denies worsening depressive symptoms, suicidal ideation, or panic; has ongoing anxiety.  Good compliance with crestor 40 mg, and trying to follow lower chol diet.  Pt denies chest pain, increased sob or doe, wheezing, orthopnea, PND, increased LE swelling, palpitations, dizziness or syncope.   Pt denies polydipsia, polyuria, or new focal neuro s/s.    ?  ?Wt Readings from Last 3 Encounters:  ?04/21/22 206 lb 9.6 oz (93.7 kg)  ?10/15/21 207 lb (93.9 kg)  ?04/07/21 215 lb (97.5 kg)  ? ?BP Readings from Last 3 Encounters:  ?04/21/22 118/70  ?10/15/21 110/72  ?04/07/21 124/76  ? ?Immunization History  ?Administered Date(s) Administered  ? Fluad Quad(high Dose 65+) 10/15/2021  ? H1N1 11/24/2008  ? Influenza Split 11/03/2011, 11/18/2012  ? Influenza Whole 09/21/2010  ? Influenza, High Dose Seasonal PF 08/13/2015, 09/27/2017, 02/05/2019  ? Influenza,inj,Quad PF,6+ Mos 01/22/2014, 08/07/2014  ? PFIZER(Purple Top)SARS-COV-2 Vaccination 02/20/2020, 03/18/2020, 09/21/2020, 01/19/2021  ? Pneumococcal Conjugate-13 08/27/2015  ? Pneumococcal Polysaccharide-23 08/12/2016  ? Td 09/21/2010  ? Tdap 04/07/2021  ? Zoster Recombinat (Shingrix) 01/25/2021, 04/14/2021  ?There are no preventive care reminders to display for this patient. ?  ? ?Past Medical History:  ?Diagnosis Date  ? Anemia   ? ANXIETY   ? BACK PAIN   ? Cataract   ? CONSTIPATION, CHRONIC   ? DEPRESSION   ? GERD (gastroesophageal reflux disease)   ? HYPERLIPIDEMIA   ? HYPERTENSION   ? NASH (NON-ALCOHOLIC STEATOHEPATITIS)   ? Obesity, unspecified   ? OSTEOPENIA   ? Pneumonia   ? x3  ? ?Past Surgical History:  ?Procedure Laterality Date  ? ABDOMINAL  HYSTERECTOMY    ? APPENDECTOMY    ? BLADDER SUSPENSION  01/18/2012  ? Procedure: TRANSVAGINAL TAPE (TVT) PROCEDURE;  Surgeon: Daria Pastures, MD;  Location: [facility];  Service: Gynecology;  Laterality: N/A;  ? BREAST BIOPSY Right   ? CATARACT EXTRACTION    ? both eyes  ? cataract surgery    ? left and right eye  ? CHOLECYSTECTOMY    ? COLONOSCOPY W/ BIOPSIES AND POLYPECTOMY  01/03/2003  ? submucosal lesion  ? CYSTOCELE REPAIR  01/18/2012  ? Procedure: ANTERIOR REPAIR (CYSTOCELE);  Surgeon: Daria Pastures, MD;  Location: [facility];  Service: Gynecology;  Laterality: N/A;  ? CYSTOSCOPY  01/18/2012  ? Procedure: CYSTOSCOPY;  Surgeon: Daria Pastures, MD;  Location: [facility];  Service: Gynecology;  Laterality: N/A;  ? ELECTROCARDIOGRAM  07/27/2006  ? s/p liver biopsy    ? Dr. Carlean Purl  ? s/p lumbar surgery    ? trigger finger repair left thumb    ? TUBAL LIGATION    ? UPPER GASTROINTESTINAL ENDOSCOPY  01/03/2003  ? ? reports that she quit smoking about 21 years ago. Her smoking use included cigarettes. She has never used smokeless tobacco. She reports current alcohol use. She reports that she does not use drugs. ?family history includes Alcohol abuse in an other family member; Heart disease in  her father; Hyperlipidemia in an other family member; Hypertension in an other family member. ?Allergies  ?Allergen Reactions  ? Codeine Nausea Only  ? ?Current Outpatient Medications on File Prior to Visit  ?Medication Sig Dispense Refill  ? albuterol (PROVENTIL HFA;VENTOLIN HFA) 108 (90 Base) MCG/ACT inhaler Inhale 2 puffs into the lungs every 6 (six) hours as needed for wheezing or shortness of breath. 1 Inhaler 2  ? ALPRAZolam (XANAX) 0.25 MG tablet Take 1 tablet (0.25 mg total) by mouth 2 (two) times daily as needed. for anxiety 60 tablet 5  ? aspirin 81 MG tablet Take 81 mg by mouth daily.    ? Cholecalciferol (VITAMIN D3) 1000 UNITS CAPS Take 5,000 Units by mouth daily.    ? ibuprofen (ADVIL,MOTRIN) 200 MG tablet Take 400 mg by  mouth daily.    ? levothyroxine (EUTHYROX) 50 MCG tablet TAKE 1 TABLET BY MOUTH ONCE DAILY 90 tablet 3  ? meclizine (ANTIVERT) 12.5 MG tablet Take 1 tablet (12.5 mg total) by mouth 3 (three) times daily as needed. 30 tablet 2  ? metoprolol succinate (TOPROL-XL) 50 MG 24 hr tablet Take with or immediately following a meal. 90 tablet 3  ? polyethylene glycol (MIRALAX / GLYCOLAX) packet Take 17 g by mouth daily as needed. For constipation 14 each 6  ? rosuvastatin (CRESTOR) 40 MG tablet Take 1 tablet (40 mg total) by mouth daily. 90 tablet 3  ? traMADol (ULTRAM) 50 MG tablet Take 1 tablet (50 mg total) by mouth every 6 (six) hours as needed. 30 tablet 0  ? triamcinolone (NASACORT) 55 MCG/ACT AERO nasal inhaler Place 2 sprays into the nose daily. 1 each 12  ? triamterene-hydrochlorothiazide (MAXZIDE-25) 37.5-25 MG tablet Take 1 tablet by mouth daily. 90 tablet 3  ? venlafaxine XR (EFFEXOR-XR) 150 MG 24 hr capsule Take 1 capsule (150 mg total) by mouth daily with breakfast. 30 capsule 0  ? ?No current facility-administered medications on file prior to visit.  ? ?     ROS:  All others reviewed and negative. ? ?Objective  ? ?     PE:  BP 118/70 (BP Location: Right Arm, Patient Position: Sitting, Cuff Size: Large)   Pulse (!) 58   Temp 98 ?F (36.7 ?C) (Oral)   Ht '5\' 3"'$  (1.6 m)   Wt 206 lb 9.6 oz (93.7 kg)   SpO2 95%   BMI 36.60 kg/m?  ? ?              Constitutional: Pt appears in NAD ?              HENT: Head: NCAT.  ?              Right Ear: External ear normal.   ?              Left Ear: External ear normal.  ?              Eyes: . Pupils are equal, round, and reactive to light. Conjunctivae and EOM are normal ?              Nose: without d/c or deformity ?              Neck: Neck supple. Gross normal ROM ?              Cardiovascular: Normal rate and regular rhythm.   ?              Pulmonary/Chest: Effort normal and  breath sounds without rales or wheezing.  ?              Abd:  Soft, NT, ND, + BS, no  organomegaly ?              Neurological: Pt is alert. At baseline orientation, motor grossly intact ?              Skin: Skin is warm. No rashes, no other new lesions, LE edema - none ?              Psychiatric: Pt behavior is normal without agitation  ? ?Micro: none ? ?Cardiac tracings I have personally interpreted today:  none ? ?Pertinent Radiological findings (summarize): none  ? ?Lab Results  ?Component Value Date  ? WBC 7.3 04/21/2022  ? HGB 14.8 04/21/2022  ? HCT 42.9 04/21/2022  ? PLT 231.0 04/21/2022  ? GLUCOSE 88 04/21/2022  ? CHOL 176 04/21/2022  ? TRIG 263.0 (H) 04/21/2022  ? HDL 52.30 04/21/2022  ? LDLDIRECT 99.0 04/21/2022  ? [place] 63 04/07/2021  ? ALT 21 04/21/2022  ? AST 32 04/21/2022  ? NA 132 (L) 04/21/2022  ? K 2.9 (L) 04/21/2022  ? CL 93 (L) 04/21/2022  ? CREATININE 0.93 04/21/2022  ? BUN 13 04/21/2022  ? CO2 31 04/21/2022  ? TSH 4.35 04/21/2022  ? INR 1.0 11/10/2008  ? HGBA1C 5.3 04/21/2022  ? ?Assessment/Plan:  ?Elizabeth Buckley is a 71 y.o. White or Caucasian [1] female with  has a past medical history of Anemia, ANXIETY, BACK PAIN, Cataract, CONSTIPATION, CHRONIC, DEPRESSION, GERD (gastroesophageal reflux disease), HYPERLIPIDEMIA, HYPERTENSION, NASH (NON-ALCOHOLIC STEATOHEPATITIS), Obesity, unspecified, OSTEOPENIA, and Pneumonia. ? ?Encounter for well adult exam with abnormal findings ?Age and sex appropriate education and counseling updated with regular exercise and diet ?Referrals for preventative services - none needed ?Immunizations addressed - none needed ?Smoking counseling  - none needed ?Evidence for depression or other mood disorder - none significant ?Most recent labs reviewed. ?I have personally reviewed and have noted: ?1) the patient's medical and social history ?2) The patient's current medications and supplements ?3) The patient's height, weight, and BMI have been recorded in the chart ? ? ?Hyperlipidemia ?Lab Results  ?Component Value Date  ? [place] 63 04/07/2021   ? ?Stable, pt to continue current statin crestor 40 ? ? ?Hyperglycemia ?Lab Results  ?Component Value Date  ? HGBA1C 5.3 04/21/2022  ? ?Stable, pt to continue current medical treatment  - diet ? ?Followup: Return in about 6 months (a

## 2022-04-27 ENCOUNTER — Encounter: Payer: Self-pay | Admitting: Internal Medicine

## 2022-04-27 NOTE — Assessment & Plan Note (Signed)

## 2022-04-27 NOTE — Assessment & Plan Note (Signed)
Lab Results  ?Component Value Date  ? [place] 63 04/07/2021  ? ?Stable, pt to continue current statin crestor 40 ? ?

## 2022-04-27 NOTE — Assessment & Plan Note (Signed)
Lab Results  ?Component Value Date  ? HGBA1C 5.3 04/21/2022  ? ?Stable, pt to continue current medical treatment  - diet ? ?

## 2022-05-02 ENCOUNTER — Ambulatory Visit: Payer: PPO

## 2022-05-04 ENCOUNTER — Ambulatory Visit
Admission: RE | Admit: 2022-05-04 | Discharge: 2022-05-04 | Disposition: A | Discharge status: Home / Self Care | Payer: PPO | Source: Ambulatory Visit | Attending: Internal Medicine | Admitting: Internal Medicine

## 2022-05-04 DIAGNOSIS — Z1231 Encounter for screening mammogram for malignant neoplasm of breast: Secondary | ICD-10-CM

## 2022-05-10 ENCOUNTER — Telehealth: Payer: Self-pay | Admitting: Internal Medicine

## 2022-05-10 MED ORDER — VENLAFAXINE HCL ER 150 MG PO CP24: 150.0000 mg | ORAL_CAPSULE | Freq: Every day | ORAL | 1 refills | Status: DC

## 2022-05-10 NOTE — Telephone Encounter (Signed)
Pt up-to-dae w/ appt sent refills to [facility].Marland KitchenJohny Chess

## 2022-05-10 NOTE — Telephone Encounter (Signed)
1.Medication Requested: venlafaxine XR (EFFEXOR-XR) 150 MG 24 hr capsule 2. Pharmacy (Name, Street, [place]): [facility] [address] [place] Phone:  [phone]  Fax:  [phone]     3. On Med List: yes  4. Last Visit with PCP:  5. Next visit date with PCP:   Agent: Please be advised that RX refills may take up to 3 business days. We ask that you follow-up with your pharmacy.

## 2022-06-20 ENCOUNTER — Telehealth: Payer: Self-pay | Admitting: Internal Medicine

## 2022-06-20 NOTE — Telephone Encounter (Signed)
Patient needs the following medications sent to pharmacy:  maxide 25 mg.                                                                                                Levothyroxine 50 mcg                                                                                               Metoprolol  50 mg   Please send to [facility] in [place]   Patient wants 90 days worth of medication

## 2022-06-22 MED ORDER — TRIAMTERENE-HCTZ 37.5-25 MG PO TABS: 1.0000 | ORAL_TABLET | Freq: Every day | ORAL | 3 refills | Status: DC

## 2022-06-22 MED ORDER — METOPROLOL SUCCINATE ER 50 MG PO TB24: ORAL_TABLET | ORAL | 3 refills | Status: DC

## 2022-06-22 MED ORDER — LEVOTHYROXINE SODIUM 50 MCG PO TABS: ORAL_TABLET | ORAL | 3 refills | Status: DC

## 2022-06-22 NOTE — Telephone Encounter (Signed)
Refills sent to pharmacy. 

## 2022-07-05 ENCOUNTER — Other Ambulatory Visit: Payer: Self-pay | Admitting: Internal Medicine

## 2022-07-05 MED ORDER — ROSUVASTATIN CALCIUM 40 MG PO TABS: 40.0000 mg | ORAL_TABLET | Freq: Every day | ORAL | 3 refills | Status: DC

## 2022-10-21 DIAGNOSIS — H04123 Dry eye syndrome of bilateral lacrimal glands: Secondary | ICD-10-CM | POA: Diagnosis not present

## 2022-10-24 ENCOUNTER — Ambulatory Visit: Payer: PPO | Admitting: Internal Medicine

## 2022-10-25 ENCOUNTER — Ambulatory Visit (INDEPENDENT_AMBULATORY_CARE_PROVIDER_SITE_OTHER): Payer: PPO | Admitting: Internal Medicine

## 2022-10-25 VITALS — BP 116/62 | HR 45 | Temp 97.8°F | Ht 63.0 in | Wt 206.0 lb

## 2022-10-25 DIAGNOSIS — E785 Hyperlipidemia, unspecified: Secondary | ICD-10-CM | POA: Diagnosis not present

## 2022-10-25 DIAGNOSIS — R739 Hyperglycemia, unspecified: Secondary | ICD-10-CM | POA: Diagnosis not present

## 2022-10-25 DIAGNOSIS — Z23 Encounter for immunization: Secondary | ICD-10-CM

## 2022-10-25 DIAGNOSIS — Z0001 Encounter for general adult medical examination with abnormal findings: Secondary | ICD-10-CM | POA: Diagnosis not present

## 2022-10-25 DIAGNOSIS — I10 Essential (primary) hypertension: Secondary | ICD-10-CM | POA: Diagnosis not present

## 2022-10-25 DIAGNOSIS — E039 Hypothyroidism, unspecified: Secondary | ICD-10-CM

## 2022-10-25 DIAGNOSIS — R001 Bradycardia, unspecified: Secondary | ICD-10-CM

## 2022-10-25 DIAGNOSIS — K580 Irritable bowel syndrome with diarrhea: Secondary | ICD-10-CM

## 2022-10-25 DIAGNOSIS — N1831 Chronic kidney disease, stage 3a: Secondary | ICD-10-CM | POA: Diagnosis not present

## 2022-10-25 MED ORDER — METOPROLOL SUCCINATE ER 25 MG PO TB24: 25.0000 mg | ORAL_TABLET | Freq: Every day | ORAL | 3 refills | Status: DC

## 2022-10-25 NOTE — Progress Notes (Unsigned)
Patient ID: Elizabeth Buckley, female   DOB: [birth year], 71 y.o.   MRN: [id number]        Chief Complaint: follow up HTN, HLD and hyperglycemia        HPI:  Elizabeth Buckley is a 71 y.o. female here with c/o        Wt Readings from Last 3 Encounters:  10/25/22 206 lb (93.4 kg)  04/21/22 206 lb 9.6 oz (93.7 kg)  10/15/21 207 lb (93.9 kg)   BP Readings from Last 3 Encounters:  10/25/22 116/62  04/21/22 118/70  10/15/21 110/72         Past Medical History:  Diagnosis Date   Anemia    ANXIETY    BACK PAIN    Cataract    CONSTIPATION, CHRONIC    DEPRESSION    GERD (gastroesophageal reflux disease)    HYPERLIPIDEMIA    HYPERTENSION    NASH (NON-ALCOHOLIC STEATOHEPATITIS)    Obesity, unspecified    OSTEOPENIA    Pneumonia    x3   Past Surgical History:  Procedure Laterality Date   ABDOMINAL HYSTERECTOMY     APPENDECTOMY     BLADDER SUSPENSION  01/18/2012   Procedure: TRANSVAGINAL TAPE (TVT) PROCEDURE;  Surgeon: Daria Pastures, MD;  Location: [facility];  Service: Gynecology;  Laterality: N/A;   BREAST BIOPSY Right    CATARACT EXTRACTION     both eyes   cataract surgery     left and right eye   CHOLECYSTECTOMY     COLONOSCOPY W/ BIOPSIES AND POLYPECTOMY  01/03/2003   submucosal lesion   CYSTOCELE REPAIR  01/18/2012   Procedure: ANTERIOR REPAIR (CYSTOCELE);  Surgeon: Daria Pastures, MD;  Location: [facility];  Service: Gynecology;  Laterality: N/A;   CYSTOSCOPY  01/18/2012   Procedure: CYSTOSCOPY;  Surgeon: Daria Pastures, MD;  Location: [facility];  Service: Gynecology;  Laterality: N/A;   ELECTROCARDIOGRAM  07/27/2006   s/p liver biopsy     Dr. Carlean Purl   s/p lumbar surgery     trigger finger repair left thumb     TUBAL LIGATION     UPPER GASTROINTESTINAL ENDOSCOPY  01/03/2003    reports that she quit smoking about 21 years ago. Her smoking use included cigarettes. She has never used smokeless tobacco. She reports current alcohol use. She reports that she does not use  drugs. family history includes Alcohol abuse in an other family member; Heart disease in her father; Hyperlipidemia in an other family member; Hypertension in an other family member. Allergies  Allergen Reactions   Codeine Nausea Only   Current Outpatient Medications on File Prior to Visit  Medication Sig Dispense Refill   albuterol (PROVENTIL HFA;VENTOLIN HFA) 108 (90 Base) MCG/ACT inhaler Inhale 2 puffs into the lungs every 6 (six) hours as needed for wheezing or shortness of breath. 1 Inhaler 2   ALPRAZolam (XANAX) 0.25 MG tablet Take 1 tablet (0.25 mg total) by mouth 2 (two) times daily as needed. for anxiety 60 tablet 5   aspirin 81 MG tablet Take 81 mg by mouth daily.     Cholecalciferol (VITAMIN D3) 1000 UNITS CAPS Take 5,000 Units by mouth daily.     ibuprofen (ADVIL,MOTRIN) 200 MG tablet Take 400 mg by mouth daily.     levothyroxine (EUTHYROX) 50 MCG tablet TAKE 1 TABLET BY MOUTH ONCE DAILY 90 tablet 3   meclizine (ANTIVERT) 12.5 MG tablet Take 1 tablet (12.5 mg total) by mouth 3 (three) times daily as needed.  30 tablet 2   metoprolol succinate (TOPROL-XL) 50 MG 24 hr tablet Take with or immediately following a meal. 90 tablet 3   polyethylene glycol (MIRALAX / GLYCOLAX) packet Take 17 g by mouth daily as needed. For constipation 14 each 6   potassium chloride (KLOR-CON 10) 10 MEQ tablet Take 1 tablet (10 mEq total) by mouth daily. 90 tablet 3   rosuvastatin (CRESTOR) 40 MG tablet Take 1 tablet (40 mg total) by mouth daily. 90 tablet 3   traMADol (ULTRAM) 50 MG tablet Take 1 tablet (50 mg total) by mouth every 6 (six) hours as needed. 30 tablet 0   triamcinolone (NASACORT) 55 MCG/ACT AERO nasal inhaler Place 2 sprays into the nose daily. 1 each 12   triamterene-hydrochlorothiazide (MAXZIDE-25) 37.5-25 MG tablet Take 1 tablet by mouth daily. 90 tablet 3   venlafaxine XR (EFFEXOR-XR) 150 MG 24 hr capsule Take 1 capsule (150 mg total) by mouth daily with breakfast. 90 capsule 1   No  current facility-administered medications on file prior to visit.        ROS:  All others reviewed and negative.  Objective        PE:  BP 116/62 (BP Location: Right Arm, Patient Position: Sitting, Cuff Size: Large)   Pulse (!) 45   Temp 97.8 F (36.6 C) (Oral)   Ht '5\' 3"'$  (1.6 m)   Wt 206 lb (93.4 kg)   SpO2 91%   BMI 36.49 kg/m                 Constitutional: Pt appears in NAD               HENT: Head: NCAT.                Right Ear: External ear normal.                 Left Ear: External ear normal.                Eyes: . Pupils are equal, round, and reactive to light. Conjunctivae and EOM are normal               Nose: without d/c or deformity               Neck: Neck supple. Gross normal ROM               Cardiovascular: Normal rate and regular rhythm.                 Pulmonary/Chest: Effort normal and breath sounds without rales or wheezing.                Abd:  Soft, NT, ND, + BS, no organomegaly               Neurological: Pt is alert. At baseline orientation, motor grossly intact               Skin: Skin is warm. No rashes, no other new lesions, LE edema - ***               Psychiatric: Pt behavior is normal without agitation   Micro: none  Cardiac tracings I have personally interpreted today:  none  Pertinent Radiological findings (summarize): none   Lab Results  Component Value Date   WBC 7.3 04/21/2022   HGB 14.8 04/21/2022   HCT 42.9 04/21/2022   PLT 231.0 04/21/2022   GLUCOSE 88 04/21/2022   CHOL 176  04/21/2022   TRIG 263.0 (H) 04/21/2022   HDL 52.30 04/21/2022   LDLDIRECT 99.0 04/21/2022   LDLCALC 63 04/07/2021   ALT 21 04/21/2022   AST 32 04/21/2022   NA 132 (L) 04/21/2022   K 2.9 (L) 04/21/2022   CL 93 (L) 04/21/2022   CREATININE 0.93 04/21/2022   BUN 13 04/21/2022   CO2 31 04/21/2022   TSH 4.35 04/21/2022   INR 1.0 11/10/2008   HGBA1C 5.3 04/21/2022   Assessment/Plan:  Elizabeth Buckley is a 71 y.o. White or Caucasian [1] female with  has a  past medical history of Anemia, ANXIETY, BACK PAIN, Cataract, CONSTIPATION, CHRONIC, DEPRESSION, GERD (gastroesophageal reflux disease), HYPERLIPIDEMIA, HYPERTENSION, NASH (NON-ALCOHOLIC STEATOHEPATITIS), Obesity, unspecified, OSTEOPENIA, and Pneumonia.  No problem-specific Assessment & Plan notes found for this encounter.  Followup: No follow-ups on file.  Cathlean Cower, MD 10/25/2022 4:25 PM [facility]

## 2022-10-25 NOTE — Patient Instructions (Signed)
You had the flu shot today  Ok to decrease the toprol xl to 25 mg per day  Please continue to monitor your Blood Pressure, as a good goal would be to be less than 130/80  Ok to take OTC Immodium for going out of the home  Please continue all other medications as before, and refills have been done if requested.  Please have the pharmacy call with any other refills you may need.  Please continue your efforts at being more active, low cholesterol diet, and weight control.  You are otherwise up to date with prevention measures today.  Please keep your appointments with your specialists as you may have planned  Please make an Appointment to return in 6 months, or sooner if needed

## 2022-10-26 ENCOUNTER — Encounter: Payer: Self-pay | Admitting: Internal Medicine

## 2022-10-26 ENCOUNTER — Ambulatory Visit (INDEPENDENT_AMBULATORY_CARE_PROVIDER_SITE_OTHER): Payer: PPO

## 2022-10-26 VITALS — Ht 63.0 in | Wt 206.0 lb

## 2022-10-26 DIAGNOSIS — Z0000 Encounter for general adult medical examination without abnormal findings: Secondary | ICD-10-CM | POA: Diagnosis not present

## 2022-10-26 DIAGNOSIS — R001 Bradycardia, unspecified: Secondary | ICD-10-CM | POA: Insufficient documentation

## 2022-10-26 DIAGNOSIS — K589 Irritable bowel syndrome without diarrhea: Secondary | ICD-10-CM | POA: Insufficient documentation

## 2022-10-26 NOTE — Assessment & Plan Note (Signed)
Lab Results  Component Value Date   LDLCALC 63 04/07/2021   Stable, pt to continue current statin crestor 40 mg qd

## 2022-10-26 NOTE — Progress Notes (Signed)
Virtual Visit via Telephone Note  I connected with  Elizabeth MANTERNACH on 10/26/22 at  3:30 PM EST by telephone and verified that I am speaking with the correct person using two identifiers.  Location: Patient: Home Provider: [place] Persons participating in the virtual visit: [place]   I discussed the limitations, risks, security and privacy concerns of performing an evaluation and management service by telephone and the availability of in person appointments. The patient expressed understanding and agreed to proceed.  Interactive audio and video telecommunications were attempted between this nurse and patient, however failed, due to patient having technical difficulties OR patient did not have access to video capability.  We continued and completed visit with audio only.  Some vital signs may be absent or patient reported.   Sheral Flow, LPN  Subjective:   Elizabeth Buckley is a 71 y.o. female who presents for Medicare Annual (Subsequent) preventive examination.  Review of Systems     Cardiac Risk Factors include: advanced age (>38mn, >>73women);dyslipidemia;hypertension;family history of premature cardiovascular disease;obesity (BMI >30kg/m2);sedentary lifestyle     Objective:    Today's Vitals   10/26/22 1532  Weight: 206 lb (93.4 kg)  Height: '5\' 3"'$  (1.6 m)  PainSc: 0-No pain   Body mass index is 36.49 kg/m.     10/26/2022    3:35 PM 01/09/2012   10:52 AM  Advanced Directives  Does Patient Have a Medical Advance Directive? No Patient does not have advance directive  Would patient like information on creating a medical advance directive? No - Patient declined     Current Medications (verified) Outpatient Encounter Medications as of 10/26/2022  Medication Sig   albuterol (PROVENTIL HFA;VENTOLIN HFA) 108 (90 Base) MCG/ACT inhaler Inhale 2 puffs into the lungs every 6 (six) hours as needed for wheezing or shortness of breath.    ALPRAZolam (XANAX) 0.25 MG tablet Take 1 tablet (0.25 mg total) by mouth 2 (two) times daily as needed. for anxiety   aspirin 81 MG tablet Take 81 mg by mouth daily.   Cholecalciferol (VITAMIN D3) 1000 UNITS CAPS Take 5,000 Units by mouth daily.   ibuprofen (ADVIL,MOTRIN) 200 MG tablet Take 400 mg by mouth daily.   levothyroxine (EUTHYROX) 50 MCG tablet TAKE 1 TABLET BY MOUTH ONCE DAILY   meclizine (ANTIVERT) 12.5 MG tablet Take 1 tablet (12.5 mg total) by mouth 3 (three) times daily as needed.   metoprolol succinate (TOPROL XL) 25 MG 24 hr tablet Take 1 tablet (25 mg total) by mouth daily.   polyethylene glycol (MIRALAX / GLYCOLAX) packet Take 17 g by mouth daily as needed. For constipation   potassium chloride (KLOR-CON 10) 10 MEQ tablet Take 1 tablet (10 mEq total) by mouth daily.   rosuvastatin (CRESTOR) 40 MG tablet Take 1 tablet (40 mg total) by mouth daily.   traMADol (ULTRAM) 50 MG tablet Take 1 tablet (50 mg total) by mouth every 6 (six) hours as needed.   triamcinolone (NASACORT) 55 MCG/ACT AERO nasal inhaler Place 2 sprays into the nose daily.   triamterene-hydrochlorothiazide (MAXZIDE-25) 37.5-25 MG tablet Take 1 tablet by mouth daily.   venlafaxine XR (EFFEXOR-XR) 150 MG 24 hr capsule Take 1 capsule (150 mg total) by mouth daily with breakfast.   No facility-administered encounter medications on file as of 10/26/2022.    Allergies (verified) Codeine   History: Past Medical History:  Diagnosis Date   Anemia    ANXIETY    BACK PAIN    Cataract  CONSTIPATION, CHRONIC    DEPRESSION    GERD (gastroesophageal reflux disease)    HYPERLIPIDEMIA    HYPERTENSION    NASH (NON-ALCOHOLIC STEATOHEPATITIS)    Obesity, unspecified    OSTEOPENIA    Pneumonia    x3   Past Surgical History:  Procedure Laterality Date   ABDOMINAL HYSTERECTOMY     APPENDECTOMY     BLADDER SUSPENSION  01/18/2012   Procedure: TRANSVAGINAL TAPE (TVT) PROCEDURE;  Surgeon: Daria Pastures, MD;   Location: [facility];  Service: Gynecology;  Laterality: N/A;   BREAST BIOPSY Right    CATARACT EXTRACTION     both eyes   cataract surgery     left and right eye   CHOLECYSTECTOMY     COLONOSCOPY W/ BIOPSIES AND POLYPECTOMY  01/03/2003   submucosal lesion   CYSTOCELE REPAIR  01/18/2012   Procedure: ANTERIOR REPAIR (CYSTOCELE);  Surgeon: Daria Pastures, MD;  Location: [facility];  Service: Gynecology;  Laterality: N/A;   CYSTOSCOPY  01/18/2012   Procedure: CYSTOSCOPY;  Surgeon: Daria Pastures, MD;  Location: [facility];  Service: Gynecology;  Laterality: N/A;   ELECTROCARDIOGRAM  07/27/2006   s/p liver biopsy     Dr. Carlean Purl   s/p lumbar surgery     trigger finger repair left thumb     TUBAL LIGATION     UPPER GASTROINTESTINAL ENDOSCOPY  01/03/2003   Family History  Problem Relation Age of Onset   Heart disease Father    Alcohol abuse Other    Hyperlipidemia Other    Hypertension Other    Colon polyps Neg Hx    Esophageal cancer Neg Hx    Stomach cancer Neg Hx    Rectal cancer Neg Hx    Social History   Socioeconomic History   Marital status: Married    Spouse name: Not on file   Number of children: 1   Years of education: Not on file   Highest education level: Not on file  Occupational History   Occupation: disabled packager  Tobacco Use   Smoking status: Former    Types: Cigarettes    Quit date: 01/08/2001    Years since quitting: 21.8   Smokeless tobacco: Never   Tobacco comments:    stopped 8 years ago  Substance and Sexual Activity   Alcohol use: Yes    Comment: occasion 3-4 x yr   Drug use: No   Sexual activity: Not on file  Other Topics Concern   Not on file  Social History Narrative   Daily caffeine, 2 cups daily   Patient does not get regular exercise   Social Determinants of Health   Financial Resource Strain: Low Risk  (10/26/2022)   Overall Financial Resource Strain (CARDIA)    Difficulty of Paying Living Expenses: Not hard at all  Food Insecurity: No  Food Insecurity (10/26/2022)   Hunger Vital Sign    Worried About Running Out of Food in the Last Year: Never true    Ran Out of Food in the Last Year: Never true  Transportation Needs: No Transportation Needs (10/26/2022)   PRAPARE - Hydrologist (Medical): No    Lack of Transportation (Non-Medical): No  Physical Activity: Inactive (10/26/2022)   Exercise Vital Sign    Days of Exercise per Week: 0 days    Minutes of Exercise per Session: 0 min  Stress: No Stress Concern Present (10/26/2022)   [place]  Feeling of Stress : Not at all  Social Connections: Unknown (10/26/2022)   Social Connection and Isolation Panel [NHANES]    Frequency of Communication with Friends and Family: More than three times a week    Frequency of Social Gatherings with Friends and Family: Three times a week    Attends Religious Services: Patient refused    Active Member of Clubs or Organizations: Patient refused    Attends Archivist Meetings: Patient refused    Marital Status: Married    Tobacco Counseling Counseling given: Not Answered Tobacco comments: stopped 8 years ago   Clinical Intake:  Pre-visit preparation completed: Yes  Pain : No/denies pain Pain Score: 0-No pain     BMI - recorded: 36.49 Nutritional Risks: None Diabetes: No  How often do you need to have someone help you when you read instructions, pamphlets, or other written materials from your doctor or pharmacy?: 1 - Never What is the last grade level you completed in school?: HSG  Diabetic? No  Interpreter Needed?: No  Information entered by :: Lisette Abu, LPN.   Activities of Daily Living    10/26/2022    3:39 PM  In your present state of health, do you have any difficulty performing the following activities:  Hearing? 0  Vision? 0  Difficulty concentrating or making decisions? 0  Walking or climbing stairs?  0  Dressing or bathing? 0  Doing errands, shopping? 0  Preparing Food and eating ? N  Using the Toilet? N  In the past six months, have you accidently leaked urine? N  Do you have problems with loss of bowel control? N  Managing your Medications? N  Managing your Finances? N  Housekeeping or managing your Housekeeping? N    Patient Care Team: Biagio Borg, MD as PCP - General  Indicate any recent Medical Services you may have received from other than [facility] providers in the past year (date may be approximate).     Assessment:   This is a routine wellness examination for [place].  Hearing/Vision screen Hearing Screening - Comments:: Denies hearing difficulties   Vision Screening - Comments:: Wears rx glasses - up to date with routine eye exams with Jola Schmidt, MD.   Dietary issues and exercise activities discussed: Current Exercise Habits: The patient does not participate in regular exercise at present, Exercise limited by: orthopedic condition(s)   Goals Addressed             This Visit's Progress    To rejoin [employer]ne[employer] Program to get more physical exercise.        Depression Screen    10/26/2022    3:39 PM 10/25/2022    4:29 PM 10/25/2022    3:54 PM 04/21/2022    1:48 PM 04/21/2022    1:35 PM 10/15/2021    1:51 PM 04/07/2021    3:47 PM  PHQ 2/9 Scores  PHQ - 2 Score 1 1 0 '1 2 1 '$ 0  PHQ- 9 Score 1  0  4      Fall Risk    10/26/2022    3:36 PM 10/25/2022    4:29 PM 10/25/2022    3:54 PM 04/21/2022    1:48 PM 04/21/2022    1:34 PM  [place] in the past year? 0 0 0 0 0  Number falls in past yr: 0 0  0 0  Injury with Fall? 0 0  0 0  Risk for fall due to :  No Fall Risks  No Fall Risks    Follow up Falls prevention discussed  Falls evaluation completed      FALL RISK PREVENTION PERTAINING TO THE HOME:  Any stairs in or around the home? Yes  If so, are there any without handrails? No  Home free of loose throw rugs in walkways, pet beds, electrical  cords, etc? Yes  Adequate lighting in your home to reduce risk of falls? Yes   ASSISTIVE DEVICES UTILIZED TO PREVENT FALLS:  Life alert? No  Use of a cane, walker or w/c? No  Grab bars in the bathroom? Yes  Shower chair or bench in shower? Yes  Elevated toilet seat or a handicapped toilet? Yes   TIMED UP AND GO:  Was the test performed? No . Phone Visit  Cognitive Function:        10/26/2022    3:36 PM  6CIT Screen  What Year? 0 points  What month? 0 points  What time? 0 points  Count back from 20 0 points  Months in reverse 0 points  Repeat phrase 0 points  Total Score 0 points    Immunizations Immunization History  Administered Date(s) Administered   Fluad Quad(high Dose 65+) 10/15/2021, 10/25/2022   H1N1 11/24/2008   Influenza Split 11/03/2011, 11/18/2012   Influenza Whole 09/21/2010   Influenza, High Dose Seasonal PF 08/13/2015, 09/27/2017, 02/05/2019   Influenza,inj,Quad PF,6+ Mos 01/22/2014, 08/07/2014   PFIZER(Purple Top)SARS-COV-2 Vaccination 02/20/2020, 03/18/2020, 09/21/2020, 01/19/2021   Pneumococcal Conjugate-13 08/27/2015   Pneumococcal Polysaccharide-23 08/12/2016   Td 09/21/2010   Tdap 04/07/2021   Zoster Recombinat (Shingrix) 01/25/2021, 04/14/2021    TDAP status: Up to date  Flu Vaccine status: Up to date  Pneumococcal vaccine status: Up to date  Covid-19 vaccine status: Completed vaccines  Qualifies for Shingles Vaccine? Yes   Zostavax completed Yes   Shingrix Completed?: Yes  Screening Tests Health Maintenance  Topic Date Due   COLONOSCOPY (Pts 45-37yr Insurance coverage will need to be confirmed)  01/24/2023   Medicare Annual Wellness (AWV)  10/27/2023   MAMMOGRAM  05/04/2024   TETANUS/TDAP  04/08/2031   Pneumonia Vaccine 72 Years old  Completed   INFLUENZA VACCINE  Completed   DEXA SCAN  Completed   Hepatitis C Screening  Completed   Zoster Vaccines- Shingrix  Completed   HPV VACCINES  Aged Out   COVID-19 Vaccine   Discontinued    Health Maintenance  There are no preventive care reminders to display for this patient.   Colorectal cancer screening: Type of screening: Colonoscopy. Completed 01/24/2013. Repeat every 10 years  Mammogram status: Completed 05/04/2022. Repeat every year  Bone Density status: Completed 08/13/2015. Results reflect: Bone density results: OSTEOPENIA. Repeat every 2-3 years.  Lung Cancer Screening: (Low Dose CT Chest recommended if Age 72-80years, 30 pack-year currently smoking OR have quit w/in 15years.) does not qualify.   Lung Cancer Screening Referral: no  Additional Screening:  Hepatitis C Screening: does qualify; Completed 04/22/2008  Vision Screening: Recommended annual ophthalmology exams for early detection of glaucoma and other disorders of the eye. Is the patient up to date with their annual eye exam?  Yes  Who is the provider or what is the name of the office in which the patient attends annual eye exams? BJola Schmidt MD. If pt is not established with a provider, would they like to be referred to a provider to establish care? No .   Dental Screening: Recommended annual dental exams for proper oral hygiene  Community Resource Referral / Chronic Care Management: CRR required this visit?  No   CCM required this visit?  No      Plan:     I have personally reviewed and noted the following in the patient's chart:   Medical and social history Use of alcohol, tobacco or illicit drugs  Current medications and supplements including opioid prescriptions. Patient is not currently taking opioid prescriptions. Functional ability and status Nutritional status Physical activity Advanced directives List of other physicians Hospitalizations, surgeries, and ER visits in previous 12 months Vitals Screenings to include cognitive, depression, and falls Referrals and appointments  In addition, I have reviewed and discussed with patient certain preventive protocols,  quality metrics, and best practice recommendations. A written personalized care plan for preventive services as well as general preventive health recommendations were provided to patient.     Sheral Flow, LPN   [id number]   Nurse Notes: N/A

## 2022-10-26 NOTE — Assessment & Plan Note (Signed)
Lab Results  Component Value Date   CREATININE 0.93 04/21/2022   Stable overall, cont to avoid nephrotoxins

## 2022-10-26 NOTE — Assessment & Plan Note (Signed)
Pt asymptomatic and unaware, will need to decrease toprol XL 25 mg qd

## 2022-10-26 NOTE — Progress Notes (Signed)
Patient ID: Elizabeth Buckley, female   DOB: 1951/08/24, 71 y.o.   MRN: [id number]         Chief Complaint:: wellness exam and bradycardia, IBS with diarrhea, hyeprglycemia, hld, htn, ckd3a       HPI:  Elizabeth Buckley is a 71 y.o. female here for wellness exam; a;ready up to date except for flu shot                        Also Pt denies chest pain, increased sob or doe, wheezing, orthopnea, PND, increased LE swelling, palpitations, dizziness or syncope.  Denies worsening reflux, abd pain, dysphagia, n/v, or blood but has ongoing IBS and loose stools with accidents that are not good when she leaves the house.   Pt denies polydipsia, polyuria, or new focal neuro s/s.    Pt denies fever, wt loss, night sweats, loss of appetite, or other constitutional symptoms   Denies hyper or hypo thyroid symptoms such as voice, skin or hair change. Wt Readings from Last 3 Encounters:  10/26/22 206 lb (93.4 kg)  10/25/22 206 lb (93.4 kg)  04/21/22 206 lb 9.6 oz (93.7 kg)   BP Readings from Last 3 Encounters:  10/25/22 116/62  04/21/22 118/70  10/15/21 110/72   Immunization History  Administered Date(s) Administered   Fluad Quad(high Dose 65+) 10/15/2021, 10/25/2022   H1N1 11/24/2008   Influenza Split 11/03/2011, 11/18/2012   Influenza Whole 09/21/2010   Influenza, High Dose Seasonal PF 08/13/2015, 09/27/2017, 02/05/2019   Influenza,inj,Quad PF,6+ Mos 01/22/2014, 08/07/2014   PFIZER(Purple Top)SARS-COV-2 Vaccination 02/20/2020, 03/18/2020, 09/21/2020, 01/19/2021   Pneumococcal Conjugate-13 08/27/2015   Pneumococcal Polysaccharide-23 08/12/2016   Td 09/21/2010   Tdap 04/07/2021   Zoster Recombinat (Shingrix) 01/25/2021, 04/14/2021  There are no preventive care reminders to display for this patient.    Past Medical History:  Diagnosis Date   Anemia    ANXIETY    BACK PAIN    Cataract    CONSTIPATION, CHRONIC    DEPRESSION    GERD (gastroesophageal reflux disease)    HYPERLIPIDEMIA     HYPERTENSION    NASH (NON-ALCOHOLIC STEATOHEPATITIS)    Obesity, unspecified    OSTEOPENIA    Pneumonia    x3   Past Surgical History:  Procedure Laterality Date   ABDOMINAL HYSTERECTOMY     APPENDECTOMY     BLADDER SUSPENSION  01/18/2012   Procedure: TRANSVAGINAL TAPE (TVT) PROCEDURE;  Surgeon: Daria Pastures, MD;  Location: [facility];  Service: Gynecology;  Laterality: N/A;   BREAST BIOPSY Right    CATARACT EXTRACTION     both eyes   cataract surgery     left and right eye   CHOLECYSTECTOMY     COLONOSCOPY W/ BIOPSIES AND POLYPECTOMY  01/03/2003   submucosal lesion   CYSTOCELE REPAIR  01/18/2012   Procedure: ANTERIOR REPAIR (CYSTOCELE);  Surgeon: Daria Pastures, MD;  Location: [facility];  Service: Gynecology;  Laterality: N/A;   CYSTOSCOPY  01/18/2012   Procedure: CYSTOSCOPY;  Surgeon: Daria Pastures, MD;  Location: [facility];  Service: Gynecology;  Laterality: N/A;   ELECTROCARDIOGRAM  07/27/2006   s/p liver biopsy     Dr. Carlean Purl   s/p lumbar surgery     trigger finger repair left thumb     TUBAL LIGATION     UPPER GASTROINTESTINAL ENDOSCOPY  01/03/2003    reports that she quit smoking about 21 years ago. Her smoking use included cigarettes. She has  never used smokeless tobacco. She reports current alcohol use. She reports that she does not use drugs. family history includes Alcohol abuse in an other family member; Heart disease in her father; Hyperlipidemia in an other family member; Hypertension in an other family member. Allergies  Allergen Reactions   Codeine Nausea Only   Current Outpatient Medications on File Prior to Visit  Medication Sig Dispense Refill   albuterol (PROVENTIL HFA;VENTOLIN HFA) 108 (90 Base) MCG/ACT inhaler Inhale 2 puffs into the lungs every 6 (six) hours as needed for wheezing or shortness of breath. 1 Inhaler 2   ALPRAZolam (XANAX) 0.25 MG tablet Take 1 tablet (0.25 mg total) by mouth 2 (two) times daily as needed. for anxiety 60 tablet 5    aspirin 81 MG tablet Take 81 mg by mouth daily.     Cholecalciferol (VITAMIN D3) 1000 UNITS CAPS Take 5,000 Units by mouth daily.     ibuprofen (ADVIL,MOTRIN) 200 MG tablet Take 400 mg by mouth daily.     levothyroxine (EUTHYROX) 50 MCG tablet TAKE 1 TABLET BY MOUTH ONCE DAILY 90 tablet 3   meclizine (ANTIVERT) 12.5 MG tablet Take 1 tablet (12.5 mg total) by mouth 3 (three) times daily as needed. 30 tablet 2   polyethylene glycol (MIRALAX / GLYCOLAX) packet Take 17 g by mouth daily as needed. For constipation 14 each 6   potassium chloride (KLOR-CON 10) 10 MEQ tablet Take 1 tablet (10 mEq total) by mouth daily. 90 tablet 3   rosuvastatin (CRESTOR) 40 MG tablet Take 1 tablet (40 mg total) by mouth daily. 90 tablet 3   traMADol (ULTRAM) 50 MG tablet Take 1 tablet (50 mg total) by mouth every 6 (six) hours as needed. 30 tablet 0   triamcinolone (NASACORT) 55 MCG/ACT AERO nasal inhaler Place 2 sprays into the nose daily. 1 each 12   triamterene-hydrochlorothiazide (MAXZIDE-25) 37.5-25 MG tablet Take 1 tablet by mouth daily. 90 tablet 3   venlafaxine XR (EFFEXOR-XR) 150 MG 24 hr capsule Take 1 capsule (150 mg total) by mouth daily with breakfast. 90 capsule 1   No current facility-administered medications on file prior to visit.        ROS:  All others reviewed and negative.  Objective        PE:  BP 116/62 (BP Location: Right Arm, Patient Position: Sitting, Cuff Size: Large)   Pulse (!) 45   Temp 97.8 F (36.6 C) (Oral)   Ht '5\' 3"'$  (1.6 m)   Wt 206 lb (93.4 kg)   SpO2 91%   BMI 36.49 kg/m                 Constitutional: Pt appears in NAD               HENT: Head: NCAT.                Right Ear: External ear normal.                 Left Ear: External ear normal.                Eyes: . Pupils are equal, round, and reactive to light. Conjunctivae and EOM are normal               Nose: without d/c or deformity               Neck: Neck supple. Gross normal ROM  Cardiovascular:  Normal rate and regular rhythm.                 Pulmonary/Chest: Effort normal and breath sounds without rales or wheezing.                Abd:  Soft, NT, ND, + BS, no organomegaly               Neurological: Pt is alert. At baseline orientation, motor grossly intact               Skin: Skin is warm. No rashes, no other new lesions, LE edema - none               Psychiatric: Pt behavior is normal without agitation   Micro: none  Cardiac tracings I have personally interpreted today:  none  Pertinent Radiological findings (summarize): none   Lab Results  Component Value Date   WBC 7.3 04/21/2022   HGB 14.8 04/21/2022   HCT 42.9 04/21/2022   PLT 231.0 04/21/2022   GLUCOSE 88 04/21/2022   CHOL 176 04/21/2022   TRIG 263.0 (H) 04/21/2022   HDL 52.30 04/21/2022   LDLDIRECT 99.0 04/21/2022   LDLCALC 63 04/07/2021   ALT 21 04/21/2022   AST 32 04/21/2022   NA 132 (L) 04/21/2022   K 2.9 (L) 04/21/2022   CL 93 (L) 04/21/2022   CREATININE 0.93 04/21/2022   BUN 13 04/21/2022   CO2 31 04/21/2022   TSH 4.35 04/21/2022   INR 1.0 11/10/2008   HGBA1C 5.3 04/21/2022   Assessment/Plan:  Elizabeth Buckley is a 71 y.o. White or Caucasian [1] female with  has a past medical history of Anemia, ANXIETY, BACK PAIN, Cataract, CONSTIPATION, CHRONIC, DEPRESSION, GERD (gastroesophageal reflux disease), HYPERLIPIDEMIA, HYPERTENSION, NASH (NON-ALCOHOLIC STEATOHEPATITIS), Obesity, unspecified, OSTEOPENIA, and Pneumonia.  Encounter for well adult exam with abnormal findings Age and sex appropriate education and counseling updated with regular exercise and diet Referrals for preventative services - none needed Immunizations addressed - for flu shot Smoking counseling  - none needed Evidence for depression or other mood disorder - none significant Most recent labs reviewed. I have personally reviewed and have noted: 1) the patient's medical and social history 2) The patient's current medications and  supplements 3) The patient's height, weight, and BMI have been recorded in the chart   Bradycardia Pt asymptomatic and unaware, will need to decrease toprol XL 25 mg qd  Hypothyroidism Lab Results  Component Value Date   TSH 4.35 04/21/2022   Stable, pt to continue levothyroxine 50 mcg qd  Hyperlipidemia Lab Results  Component Value Date   LDLCALC 63 04/07/2021   Stable, pt to continue current statin crestor 40 mg qd   Hyperglycemia Lab Results  Component Value Date   HGBA1C 5.3 04/21/2022   Stable, pt to continue current medical treatment - diet, wt control, excercise   Essential hypertension BP Readings from Last 3 Encounters:  10/25/22 116/62  04/21/22 118/70  10/15/21 110/72   Low normal uncontrolled, pt to continue medical treatment maxide 1 qd, and decreased toprol xl 25 qd   CKD (chronic kidney disease) Lab Results  Component Value Date   CREATININE 0.93 04/21/2022   Stable overall, cont to avoid nephrotoxins   IBS (irritable bowel syndrome) Pt ok for prn immodium OTC,  to f/u any worsening symptoms or concerns  Followup: Return in about 6 months (around 04/25/2023).  Cathlean Cower, MD 10/26/2022 7:45 PM [facility]

## 2022-10-26 NOTE — Assessment & Plan Note (Signed)
Pt ok for prn immodium OTC,  to f/u any worsening symptoms or concerns

## 2022-10-26 NOTE — Assessment & Plan Note (Addendum)
Age and sex appropriate education and counseling updated with regular exercise and diet Referrals for preventative services - none needed Immunizations addressed - for flu shot Smoking counseling  - none needed Evidence for depression or other mood disorder - none significant Most recent labs reviewed. I have personally reviewed and have noted: 1) the patient's medical and social history 2) The patient's current medications and supplements 3) The patient's height, weight, and BMI have been recorded in the chart

## 2022-10-26 NOTE — Assessment & Plan Note (Signed)
BP Readings from Last 3 Encounters:  10/25/22 116/62  04/21/22 118/70  10/15/21 110/72   Low normal uncontrolled, pt to continue medical treatment maxide 1 qd, and decreased toprol xl 25 qd

## 2022-10-26 NOTE — Assessment & Plan Note (Signed)
Lab Results  Component Value Date   TSH 4.35 04/21/2022   Stable, pt to continue levothyroxine 50 mcg qd

## 2022-10-26 NOTE — Patient Instructions (Signed)
Elizabeth Buckley , Thank you for taking time to come for your Medicare Wellness Visit. I appreciate your ongoing commitment to your health goals. Please review the following plan we discussed and let me know if I can assist you in the future.   These are the goals we discussed:  Goals      To rejoin [employer] Program to get more physical exercise.        This is a list of the screening recommended for you and due dates:  Health Maintenance  Topic Date Due   Colon Cancer Screening  01/24/2023   Medicare Annual Wellness Visit  10/27/2023   Mammogram  05/04/2024   Tetanus Vaccine  04/08/2031   Pneumonia Vaccine  Completed   Flu Shot  Completed   DEXA scan (bone density measurement)  Completed   Hepatitis C Screening: USPSTF Recommendation to screen - Ages 79-79 yo.  Completed   Zoster (Shingles) Vaccine  Completed   HPV Vaccine  Aged Out   COVID-19 Vaccine  Discontinued    Advanced directives: No  Conditions/risks identified: Yes  Next appointment: Follow up in one year for your annual wellness visit.   Preventive Care 72 Years and Older, Female Preventive care refers to lifestyle choices and visits with your health care provider that can promote health and wellness. What does preventive care include? A yearly physical exam. This is also called an annual well check. Dental exams once or twice a year. Routine eye exams. Ask your health care provider how often you should have your eyes checked. Personal lifestyle choices, including: Daily care of your teeth and gums. Regular physical activity. Eating a healthy diet. Avoiding tobacco and drug use. Limiting alcohol use. Practicing safe sex. Taking low-dose aspirin every day. Taking vitamin and mineral supplements as recommended by your health care provider. What happens during an annual well check? The services and screenings done by your health care provider during your annual well check will depend on your age, overall  health, lifestyle risk factors, and family history of disease. Counseling  Your health care provider may ask you questions about your: Alcohol use. Tobacco use. Drug use. Emotional well-being. Home and relationship well-being. Sexual activity. Eating habits. History of falls. Memory and ability to understand (cognition). Work and work Statistician. Reproductive health. Screening  You may have the following tests or measurements: Height, weight, and BMI. Blood pressure. Lipid and cholesterol levels. These may be checked every 5 years, or more frequently if you are over 62 years old. Skin check. Lung cancer screening. You may have this screening every year starting at age 49 if you have a 30-pack-year history of smoking and currently smoke or have quit within the past 15 years. Fecal occult blood test (FOBT) of the stool. You may have this test every year starting at age 11. Flexible sigmoidoscopy or colonoscopy. You may have a sigmoidoscopy every 5 years or a colonoscopy every 10 years starting at age 3. Hepatitis C blood test. Hepatitis B blood test. Sexually transmitted disease (STD) testing. Diabetes screening. This is done by checking your blood sugar (glucose) after you have not eaten for a while (fasting). You may have this done every 1-3 years. Bone density scan. This is done to screen for osteoporosis. You may have this done starting at age 59. Mammogram. This may be done every 1-2 years. Talk to your health care provider about how often you should have regular mammograms. Talk with your health care provider about your test results,  treatment options, and if necessary, the need for more tests. Vaccines  Your health care provider may recommend certain vaccines, such as: Influenza vaccine. This is recommended every year. Tetanus, diphtheria, and acellular pertussis (Tdap, Td) vaccine. You may need a Td booster every 10 years. Zoster vaccine. You may need this after age  67. Pneumococcal 13-valent conjugate (PCV13) vaccine. One dose is recommended after age 24. Pneumococcal polysaccharide (PPSV23) vaccine. One dose is recommended after age 79. Talk to your health care provider about which screenings and vaccines you need and how often you need them. This information is not intended to replace advice given to you by your health care provider. Make sure you discuss any questions you have with your health care provider. Document Released: 01/01/2016 Document Revised: 08/24/2016 Document Reviewed: 10/06/2015 Elsevier Interactive Patient Education  2017 [facility] Prevention in the Home Falls can cause injuries. They can happen to people of all ages. There are many things you can do to make your home safe and to help prevent falls. What can I do on the outside of my home? Regularly fix the edges of walkways and driveways and fix any cracks. Remove anything that might make you trip as you walk through a door, such as a raised step or threshold. Trim any bushes or trees on the path to your home. Use bright outdoor lighting. Clear any walking paths of anything that might make someone trip, such as rocks or tools. Regularly check to see if handrails are loose or broken. Make sure that both sides of any steps have handrails. Any raised decks and porches should have guardrails on the edges. Have any leaves, snow, or ice cleared regularly. Use sand or salt on walking paths during winter. Clean up any spills in your garage right away. This includes oil or grease spills. What can I do in the bathroom? Use night lights. Install grab bars by the toilet and in the tub and shower. Do not use towel bars as grab bars. Use non-skid mats or decals in the tub or shower. If you need to sit down in the shower, use a plastic, non-slip stool. Keep the floor dry. Clean up any water that spills on the floor as soon as it happens. Remove soap buildup in the tub or shower  regularly. Attach bath mats securely with double-sided non-slip rug tape. Do not have throw rugs and other things on the floor that can make you trip. What can I do in the bedroom? Use night lights. Make sure that you have a light by your bed that is easy to reach. Do not use any sheets or blankets that are too big for your bed. They should not hang down onto the floor. Have a firm chair that has side arms. You can use this for support while you get dressed. Do not have throw rugs and other things on the floor that can make you trip. What can I do in the kitchen? Clean up any spills right away. Avoid walking on wet floors. Keep items that you use a lot in easy-to-reach places. If you need to reach something above you, use a strong step stool that has a grab bar. Keep electrical cords out of the way. Do not use floor polish or wax that makes floors slippery. If you must use wax, use non-skid floor wax. Do not have throw rugs and other things on the floor that can make you trip. What can I do with my stairs? Do  not leave any items on the stairs. Make sure that there are handrails on both sides of the stairs and use them. Fix handrails that are broken or loose. Make sure that handrails are as long as the stairways. Check any carpeting to make sure that it is firmly attached to the stairs. Fix any carpet that is loose or worn. Avoid having throw rugs at the top or bottom of the stairs. If you do have throw rugs, attach them to the floor with carpet tape. Make sure that you have a light switch at the top of the stairs and the bottom of the stairs. If you do not have them, ask someone to add them for you. What else can I do to help prevent falls? Wear shoes that: Do not have high heels. Have rubber bottoms. Are comfortable and fit you well. Are closed at the toe. Do not wear sandals. If you use a stepladder: Make sure that it is fully opened. Do not climb a closed stepladder. Make sure that  both sides of the stepladder are locked into place. Ask someone to hold it for you, if possible. Clearly mark and make sure that you can see: Any grab bars or handrails. First and last steps. Where the edge of each step is. Use tools that help you move around (mobility aids) if they are needed. These include: Canes. Walkers. Scooters. Crutches. Turn on the lights when you go into a dark area. Replace any light bulbs as soon as they burn out. Set up your furniture so you have a clear path. Avoid moving your furniture around. If any of your floors are uneven, fix them. If there are any pets around you, be aware of where they are. Review your medicines with your doctor. Some medicines can make you feel dizzy. This can increase your chance of falling. Ask your doctor what other things that you can do to help prevent falls. This information is not intended to replace advice given to you by your health care provider. Make sure you discuss any questions you have with your health care provider. Document Released: 10/01/2009 Document Revised: 05/12/2016 Document Reviewed: 01/09/2015 Elsevier Interactive Patient Education  2017 [employer].

## 2022-10-26 NOTE — Assessment & Plan Note (Signed)
Lab Results  Component Value Date   HGBA1C 5.3 04/21/2022   Stable, pt to continue current medical treatment - diet, wt control, excercise

## 2022-11-07 ENCOUNTER — Telehealth: Payer: Self-pay | Admitting: Internal Medicine

## 2022-11-07 NOTE — Telephone Encounter (Signed)
Caller & Relationship to patient: Elizabeth Buckley   Call back [phone]   Date of last office visit:10/25/22   Date of next office visit:04/25/23   Medication(s) to be refilled:venlafaxine XR (EFFEXOR-XR) 150 MG 24 hr capsule.  Patient said she is completely out and really needs today        Preferred Pharmacy: [facility] [address] [place] [address]

## 2022-11-08 NOTE — Telephone Encounter (Signed)
Patient called back and wants the cma to call her.

## 2022-11-08 NOTE — Telephone Encounter (Signed)
Pt says [facility] told her the doctor has rejected refill of EFFEXOR-XR 150 MG 24hr capsule.  Pt asking why PCP rejected refill EFFEXOR XR  Call pt [phone] to confirm when done.

## 2022-11-09 MED ORDER — VENLAFAXINE HCL ER 150 MG PO CP24: 150.0000 mg | ORAL_CAPSULE | Freq: Every day | ORAL | 1 refills | Status: DC

## 2022-11-09 NOTE — Telephone Encounter (Signed)
Pt has called again to check status of RX refill. Pt has been out of medication for several days and is very distressed, crying and not sleeping.   RX request is in [facility] [address] folder, please refill RX ASAP.  Pt is also requesting a call from [facility] CMA [phone]

## 2022-11-09 NOTE — Telephone Encounter (Signed)
Ok done erx 

## 2023-03-01 ENCOUNTER — Encounter: Payer: Self-pay | Admitting: Internal Medicine

## 2023-03-24 ENCOUNTER — Encounter: Payer: Self-pay | Admitting: Internal Medicine

## 2023-04-06 ENCOUNTER — Ambulatory Visit (AMBULATORY_SURGERY_CENTER): Payer: Medicare Other

## 2023-04-06 ENCOUNTER — Other Ambulatory Visit: Payer: Self-pay | Admitting: Internal Medicine

## 2023-04-06 ENCOUNTER — Encounter: Payer: Self-pay | Admitting: Internal Medicine

## 2023-04-06 VITALS — Ht 63.0 in | Wt 205.0 lb

## 2023-04-06 DIAGNOSIS — Z1231 Encounter for screening mammogram for malignant neoplasm of breast: Secondary | ICD-10-CM

## 2023-04-06 DIAGNOSIS — Z1211 Encounter for screening for malignant neoplasm of colon: Secondary | ICD-10-CM

## 2023-04-06 MED ORDER — NA SULFATE-K SULFATE-MG SULF 17.5-3.13-1.6 GM/177ML PO SOLN: 1.0000 | Freq: Once | ORAL | 0 refills | Status: AC

## 2023-04-06 NOTE — Progress Notes (Signed)
No egg or soy allergy known to patient  No issues known to pt with past sedation with any surgeries or procedures Patient denies ever being told they had issues or difficulty with intubation  No FH of Malignant Hyperthermia Pt is not on diet pills Pt is not on  home 02  Pt is not on blood thinners  Pt denies issues with constipation  No A fib or A flutter Have any cardiac testing pending--no Pt instructed to use [email or web address] or GoodRx for a price reduction on prep   

## 2023-04-24 ENCOUNTER — Telehealth: Payer: Self-pay | Admitting: Internal Medicine

## 2023-04-24 NOTE — Telephone Encounter (Signed)
Prescription Request  04/24/2023  LOV: 10/25/2022  What is the name of the medication or equipment? venlafaxine XR (EFFEXOR-XR) 150 MG 24 hr capsule   Have you contacted your pharmacy to request a refill? No   Which pharmacy would you like this sent to?  [facility] [address] [place] - [address] [place] [address] Phone: [phone] Fax: [phone]    Patient notified that their request is being sent to the clinical staff for review and that they should receive a response within 2 business days.   Please advise at Mobile [phone] (mobile)

## 2023-04-24 NOTE — Telephone Encounter (Signed)
Pt not due for refill until 05/22.

## 2023-04-25 ENCOUNTER — Ambulatory Visit: Payer: PPO | Admitting: Internal Medicine

## 2023-04-28 ENCOUNTER — Other Ambulatory Visit: Payer: Self-pay | Admitting: Internal Medicine

## 2023-05-01 ENCOUNTER — Ambulatory Visit: Payer: Medicare Other | Admitting: Internal Medicine

## 2023-05-02 ENCOUNTER — Encounter: Payer: Self-pay | Admitting: Internal Medicine

## 2023-05-02 ENCOUNTER — Telehealth: Payer: Self-pay | Admitting: Internal Medicine

## 2023-05-02 ENCOUNTER — Ambulatory Visit (AMBULATORY_SURGERY_CENTER): Payer: Medicare Other | Admitting: Internal Medicine

## 2023-05-02 VITALS — BP 142/80 | HR 67 | Temp 96.6°F | Resp 16 | Ht 63.0 in | Wt 205.0 lb

## 2023-05-02 DIAGNOSIS — D123 Benign neoplasm of transverse colon: Secondary | ICD-10-CM | POA: Diagnosis not present

## 2023-05-02 DIAGNOSIS — Z1211 Encounter for screening for malignant neoplasm of colon: Secondary | ICD-10-CM

## 2023-05-02 MED ORDER — SODIUM CHLORIDE 0.9 % IV SOLN: 500.0000 mL | Freq: Once | INTRAVENOUS | Status: DC

## 2023-05-02 NOTE — Progress Notes (Signed)
[facility] History and Physical   Primary Care Physician:  Corwin Levins, MD   Reason for Procedure:   CRCA screening  Plan:    colonoscopy     HPI: Elizabeth Buckley is a 72 y.o. female for screening exam   Past Medical History:  Diagnosis Date   Anemia    ANXIETY    BACK PAIN    Cataract    CONSTIPATION, CHRONIC    DEPRESSION    GERD (gastroesophageal reflux disease)    HYPERLIPIDEMIA    HYPERTENSION    NASH (NON-ALCOHOLIC STEATOHEPATITIS)    Obesity, unspecified    OSTEOPENIA    Pneumonia    x3    Past Surgical History:  Procedure Laterality Date   ABDOMINAL HYSTERECTOMY     APPENDECTOMY     BLADDER SUSPENSION  01/18/2012   Procedure: TRANSVAGINAL TAPE (TVT) PROCEDURE;  Surgeon: Loney Laurence, MD;  Location: [facility];  Service: Gynecology;  Laterality: N/A;   BREAST BIOPSY Right    CATARACT EXTRACTION     both eyes   cataract surgery     left and right eye   CHOLECYSTECTOMY     COLONOSCOPY W/ BIOPSIES AND POLYPECTOMY  01/03/2003   submucosal lesion   CYSTOCELE REPAIR  01/18/2012   Procedure: ANTERIOR REPAIR (CYSTOCELE);  Surgeon: Loney Laurence, MD;  Location: [facility];  Service: Gynecology;  Laterality: N/A;   CYSTOSCOPY  01/18/2012   Procedure: CYSTOSCOPY;  Surgeon: Loney Laurence, MD;  Location: [facility];  Service: Gynecology;  Laterality: N/A;   ELECTROCARDIOGRAM  07/27/2006   s/p liver biopsy     Dr. Leone Payor   s/p lumbar surgery     trigger finger repair left thumb     TUBAL LIGATION     UPPER GASTROINTESTINAL ENDOSCOPY  01/03/2003    Prior to Admission medications   Medication Sig Start Date End Date Taking? Authorizing Provider  Cholecalciferol (VITAMIN D3) 1000 UNITS CAPS Take 5,000 Units by mouth daily.   Yes [provider]  ibuprofen (ADVIL,MOTRIN) 200 MG tablet Take 400 mg by mouth daily.   Yes [provider]  levothyroxine (EUTHYROX) 50 MCG tablet TAKE 1 TABLET BY MOUTH ONCE DAILY 06/22/22  Yes Corwin Levins, MD  meclizine (ANTIVERT) 12.5 MG tablet Take 1 tablet (12.5 mg total) by mouth 3 (three) times daily as needed. 09/27/17  Yes Corwin Levins, MD  metoprolol succinate (TOPROL XL) 25 MG 24 hr tablet Take 1 tablet (25 mg total) by mouth daily. 10/25/22  Yes Corwin Levins, MD  potassium chloride (KLOR-CON 10) 10 MEQ tablet Take 1 tablet (10 mEq total) by mouth daily. 04/21/22  Yes Corwin Levins, MD  rosuvastatin (CRESTOR) 40 MG tablet Take 1 tablet (40 mg total) by mouth daily. 07/05/22  Yes Corwin Levins, MD  traMADol (ULTRAM) 50 MG tablet Take 1 tablet (50 mg total) by mouth every 6 (six) hours as needed. 02/05/19  Yes Corwin Levins, MD  triamterene-hydrochlorothiazide (MAXZIDE-25) 37.5-25 MG tablet Take 1 tablet by mouth daily. 06/22/22  Yes Corwin Levins, MD  venlafaxine XR (EFFEXOR-XR) 150 MG 24 hr capsule Take 1 capsule (150 mg total) by mouth daily with breakfast. 11/09/22  Yes Corwin Levins, MD  albuterol (PROVENTIL HFA;VENTOLIN HFA) 108 (90 Base) MCG/ACT inhaler Inhale 2 puffs into the lungs every 6 (six) hours as needed for wheezing or shortness of breath. 09/27/17   Corwin Levins, MD  ALPRAZolam Prudy Feeler) 0.25 MG tablet  Take 1 tablet (0.25 mg total) by mouth 2 (two) times daily as needed. for anxiety 04/06/20   Corwin Levins, MD  aspirin 81 MG tablet Take 81 mg by mouth daily. Patient not taking: Reported on 04/06/2023    [provider]  polyethylene glycol (MIRALAX / GLYCOLAX) packet Take 17 g by mouth daily as needed. For constipation Patient not taking: Reported on 04/06/2023 09/27/17   Corwin Levins, MD  triamcinolone (NASACORT) 55 MCG/ACT AERO nasal inhaler Place 2 sprays into the nose daily. Patient not taking: Reported on 04/06/2023 04/07/21   Corwin Levins, MD    Current Outpatient Medications  Medication Sig Dispense Refill   Cholecalciferol (VITAMIN D3) 1000 UNITS CAPS Take 5,000 Units by mouth daily.     ibuprofen (ADVIL,MOTRIN) 200 MG tablet Take 400 mg by mouth daily.      levothyroxine (EUTHYROX) 50 MCG tablet TAKE 1 TABLET BY MOUTH ONCE DAILY 90 tablet 3   meclizine (ANTIVERT) 12.5 MG tablet Take 1 tablet (12.5 mg total) by mouth 3 (three) times daily as needed. 30 tablet 2   metoprolol succinate (TOPROL XL) 25 MG 24 hr tablet Take 1 tablet (25 mg total) by mouth daily. 90 tablet 3   potassium chloride (KLOR-CON 10) 10 MEQ tablet Take 1 tablet (10 mEq total) by mouth daily. 90 tablet 3   rosuvastatin (CRESTOR) 40 MG tablet Take 1 tablet (40 mg total) by mouth daily. 90 tablet 3   traMADol (ULTRAM) 50 MG tablet Take 1 tablet (50 mg total) by mouth every 6 (six) hours as needed. 30 tablet 0   triamterene-hydrochlorothiazide (MAXZIDE-25) 37.5-25 MG tablet Take 1 tablet by mouth daily. 90 tablet 3   venlafaxine XR (EFFEXOR-XR) 150 MG 24 hr capsule Take 1 capsule (150 mg total) by mouth daily with breakfast. 90 capsule 1   albuterol (PROVENTIL HFA;VENTOLIN HFA) 108 (90 Base) MCG/ACT inhaler Inhale 2 puffs into the lungs every 6 (six) hours as needed for wheezing or shortness of breath. 1 Inhaler 2   ALPRAZolam (XANAX) 0.25 MG tablet Take 1 tablet (0.25 mg total) by mouth 2 (two) times daily as needed. for anxiety 60 tablet 5   aspirin 81 MG tablet Take 81 mg by mouth daily. (Patient not taking: Reported on 04/06/2023)     polyethylene glycol (MIRALAX / GLYCOLAX) packet Take 17 g by mouth daily as needed. For constipation (Patient not taking: Reported on 04/06/2023) 14 each 6   triamcinolone (NASACORT) 55 MCG/ACT AERO nasal inhaler Place 2 sprays into the nose daily. (Patient not taking: Reported on 04/06/2023) 1 each 12   Current Facility-Administered Medications  Medication Dose Route Frequency Provider Last Rate Last Admin   0.9 %  sodium chloride infusion  500 mL Intravenous Once Iva Boop, MD        Allergies as of 05/02/2023 - Review Complete 05/02/2023  Allergen Reaction Noted   Codeine Nausea Only     Family History  Problem Relation Age of Onset    Heart disease Father    Alcohol abuse Other    Hyperlipidemia Other    Hypertension Other    Colon polyps Neg Hx    Esophageal cancer Neg Hx    Stomach cancer Neg Hx    Rectal cancer Neg Hx     Social History   Socioeconomic History   Marital status: Married    Spouse name: Not on file   Number of children: 1   Years of education: Not on file  Highest education level: Not on file  Occupational History   Occupation: disabled packager  Tobacco Use   Smoking status: Former    Types: Cigarettes    Quit date: 01/08/2001    Years since quitting: 22.3   Smokeless tobacco: Never  Vaping Use   Vaping Use: Never used  Substance and Sexual Activity   Alcohol use: Yes    Comment: occasion 3-4 x yr   Drug use: No   Sexual activity: Not on file  Other Topics Concern   Not on file  Social History Narrative   Daily caffeine, 2 cups daily   Patient does not get regular exercise   Social Determinants of Health   Financial Resource Strain: Low Risk  (10/26/2022)   Overall Financial Resource Strain (CARDIA)    Difficulty of Paying Living Expenses: Not hard at all  Food Insecurity: No Food Insecurity (10/26/2022)   Hunger Vital Sign    Worried About Running Out of Food in the Last Year: Never true    Ran Out of Food in the Last Year: Never true  Transportation Needs: No Transportation Needs (10/26/2022)   PRAPARE - Administrator, Civil Service (Medical): No    Lack of Transportation (Non-Medical): No  Physical Activity: Inactive (10/26/2022)   Exercise Vital Sign    Days of Exercise per Week: 0 days    Minutes of Exercise per Session: 0 min  Stress: No Stress Concern Present (10/26/2022)   [employer] of Occupational Health - Occupational Stress Questionnaire    Feeling of Stress : Not at all  Social Connections: Unknown (10/26/2022)   Social Connection and Isolation Panel [NHANES]    Frequency of Communication with Friends and Family: More than three times a week     Frequency of Social Gatherings with Friends and Family: Three times a week    Attends Religious Services: Patient declined    Active Member of Clubs or Organizations: Patient declined    Attends Banker Meetings: Patient declined    Marital Status: Married  Catering manager Violence: Not At Risk (10/26/2022)   Humiliation, Afraid, Rape, and Kick questionnaire    Fear of Current or Ex-Partner: No    Emotionally Abused: No    Physically Abused: No    Sexually Abused: No    Review of Systems:  All other review of systems negative except as mentioned in the HPI.  Physical Exam: Vital signs BP (!) 102/59   Pulse 82   Temp (!) 96.6 F (35.9 C)   Ht 5\' 3"  (1.6 m)   Wt 205 lb (93 kg)   SpO2 92%   BMI 36.31 kg/m   General:   Alert,  Well-developed, well-nourished, pleasant and cooperative in NAD Lungs:  Clear throughout to auscultation.   Heart:  Regular rate and rhythm; no murmurs, clicks, rubs,  or gallops. Abdomen:  Soft, nontender and nondistended. Normal bowel sounds.   Neuro/Psych:  Alert and cooperative. Normal mood and affect. A and O x 3   @Roshard Rezabek  Sena Slate, MD, [facility] [phone] (pager) 05/02/2023 11:24 AM@

## 2023-05-02 NOTE — Telephone Encounter (Signed)
Spoke with pt. She is concerned because she is still going to the bathroom and has soiled multiple undergarments. Reassured pt that the BMs should slow down soon, but that it is not uncommon for pt's to still be going to the bathroom on arrival. Suggested to pt that she use a depends or pads in her underwear and to put towels down in the seat of her care. Pt verbalized understanding.

## 2023-05-02 NOTE — Progress Notes (Signed)
Pt's states no medical or surgical changes since previsit or office visit. 

## 2023-05-02 NOTE — Progress Notes (Signed)
Vss nad trans to pacu 

## 2023-05-02 NOTE — Op Note (Signed)
[facility] Patient Name: Elizabeth Buckley Procedure Date: 05/02/2023 11:42 AM MRN: [id number] Endoscopist: Iva Boop , MD, [id number] Age: 72 Referring MD:  Date of Birth: [birth year] Gender: Female Account #: [id number] Procedure:                Colonoscopy Indications:              Screening for colorectal malignant neoplasm, Last                            colonoscopy: 2014 Medicines:                Monitored Anesthesia Care Procedure:                Pre-Anesthesia Assessment:                           - Prior to the procedure, a History and Physical                            was performed, and patient medications and                            allergies were reviewed. The patient's tolerance of                            previous anesthesia was also reviewed. The risks                            and benefits of the procedure and the sedation                            options and risks were discussed with the patient.                            All questions were answered, and informed consent                            was obtained. Prior Anticoagulants: The patient has                            taken no anticoagulant or antiplatelet agents. ASA                            Grade Assessment: II - A patient with mild systemic                            disease. After reviewing the risks and benefits,                            the patient was deemed in satisfactory condition to                            undergo the procedure.  After obtaining informed consent, the colonoscope                            was passed under direct vision. Throughout the                            procedure, the patient's blood pressure, pulse, and                            oxygen saturations were monitored continuously. The                            CF [id number] #[id number] was introduced through the anus                            and advanced to the the cecum,  identified by                            appendiceal orifice and ileocecal valve. The                            colonoscopy was somewhat difficult due to                            significant looping. Successful completion of the                            procedure was aided by straightening and shortening                            the scope to obtain bowel loop reduction and using                            scope torsion. The patient tolerated the procedure                            well. The quality of the bowel preparation was                            good. The ileocecal valve, appendiceal orifice, and                            rectum were photographed. The bowel preparation                            used was SUPREP via split dose instruction. Scope In: 11:58:05 AM Scope Out: 12:18:34 PM Scope Withdrawal Time: 0 hours 11 minutes 27 seconds  Total Procedure Duration: 0 hours 20 minutes 29 seconds  Findings:                 The perianal and digital rectal examinations were                            normal.  A diminutive polyp was found in the distal                            transverse colon. The polyp was sessile. The polyp                            was removed with a cold snare. Resection and                            retrieval were complete. Verification of patient                            identification for the specimen was done. Estimated                            blood loss was minimal.                           Multiple diverticula were found in the sigmoid                            colon.                           The exam was otherwise without abnormality on                            direct and retroflexion views. Complications:            No immediate complications. Estimated Blood Loss:     Estimated blood loss was minimal. Impression:               - One diminutive polyp in the distal transverse                            colon,  removed with a cold snare. Resected and                            retrieved.                           - Diverticulosis in the sigmoid colon.                           - The examination was otherwise normal on direct                            and retroflexion views. Recommendation:           - Patient has a contact number available for                            emergencies. The signs and symptoms of potential                            delayed complications were discussed with the  patient. Return to normal activities tomorrow.                            Written discharge instructions were provided to the                            patient.                           - Resume previous diet.                           - Continue present medications.                           - No recommendation at this time regarding repeat                            colonoscopy due to age.                           - Await pathology results. Iva Boop, MD 05/02/2023 12:25:42 PM This report has been signed electronically.

## 2023-05-02 NOTE — Progress Notes (Signed)
Called to room to assist during endoscopic procedure.  Patient ID and intended procedure confirmed with present staff. Received instructions for my participation in the procedure from the performing physician.  

## 2023-05-02 NOTE — Telephone Encounter (Signed)
Inbound call from patient states she is having a hard time with her bowls this morning. Patient is scheduled to come in for a procedure at 10:30, please advise.  Thank you

## 2023-05-02 NOTE — Patient Instructions (Addendum)
I found and removed one tiny colon polyp that looks benign.  You also have a condition called diverticulosis - common and not usually a problem. Please read the handout provided.  I will let you know pathology results and if you should  have another routine colonoscopy by mail and/or My Chart.  I appreciate the opportunity to care for you. Iva Boop, MD, [facility]   Discharge instructions given. Handouts on polyps and diverticulosis. Resume previous medications. YOU HAD AN ENDOSCOPIC PROCEDURE TODAY AT THE [facility]:   Refer to the procedure report that was given to you for any specific questions about what was found during the examination.  If the procedure report does not answer your questions, please call your gastroenterologist to clarify.  If you requested that your care partner not be given the details of your procedure findings, then the procedure report has been included in a sealed envelope for you to review at your convenience later.  YOU SHOULD EXPECT: Some feelings of bloating in the abdomen. Passage of more gas than usual.  Walking can help get rid of the air that was put into your GI tract during the procedure and reduce the bloating. If you had a lower endoscopy (such as a colonoscopy or flexible sigmoidoscopy) you may notice spotting of blood in your stool or on the toilet paper. If you underwent a bowel prep for your procedure, you may not have a normal bowel movement for a few days.  Please Note:  You might notice some irritation and congestion in your nose or some drainage.  This is from the oxygen used during your procedure.  There is no need for concern and it should clear up in a day or so.  SYMPTOMS TO REPORT IMMEDIATELY:  Following lower endoscopy (colonoscopy or flexible sigmoidoscopy):  Excessive amounts of blood in the stool  Significant tenderness or worsening of abdominal pains  Swelling of the abdomen that is new, acute  Fever of 100F or  higher   For urgent or emergent issues, a gastroenterologist can be reached at any hour by calling [phone]. Do not use MyChart messaging for urgent concerns.    DIET:  We do recommend a small meal at first, but then you may proceed to your regular diet.  Drink plenty of fluids but you should avoid alcoholic beverages for 24 hours.  ACTIVITY:  You should plan to take it easy for the rest of today and you should NOT DRIVE or use heavy machinery until tomorrow (because of the sedation medicines used during the test).    FOLLOW UP: Our staff will call the number listed on your records the next business day following your procedure.  We will call around 7:15- 8:00 am to check on you and address any questions or concerns that you may have regarding the information given to you following your procedure. If we do not reach you, we will leave a message.     If any biopsies were taken you will be contacted by phone or by letter within the next 1-3 weeks.  Please call us at [phone] if you have not heard about the biopsies in 3 weeks.    SIGNATURES/CONFIDENTIALITY: You and/or your care partner have signed paperwork which will be entered into your electronic medical record.  These signatures attest to the fact that that the information above on your After Visit Summary has been reviewed and is understood.  Full responsibility of the confidentiality of this discharge information  lies with you and/or your care-partner.

## 2023-05-03 ENCOUNTER — Telehealth: Payer: Self-pay | Admitting: *Deleted

## 2023-05-03 NOTE — Telephone Encounter (Signed)
Post procedure follow up phone call. No answer at number given.  Left message on voicemail.  

## 2023-05-03 NOTE — Telephone Encounter (Signed)
Patient returned call, stated she had no questions or concerns.

## 2023-05-08 ENCOUNTER — Encounter: Payer: Self-pay | Admitting: Internal Medicine

## 2023-05-08 ENCOUNTER — Ambulatory Visit (INDEPENDENT_AMBULATORY_CARE_PROVIDER_SITE_OTHER): Payer: Medicare Other | Admitting: Internal Medicine

## 2023-05-08 VITALS — BP 126/78 | HR 88 | Temp 98.3°F | Ht 63.0 in | Wt 208.0 lb

## 2023-05-08 DIAGNOSIS — E559 Vitamin D deficiency, unspecified: Secondary | ICD-10-CM | POA: Diagnosis not present

## 2023-05-08 DIAGNOSIS — F32A Depression, unspecified: Secondary | ICD-10-CM | POA: Diagnosis not present

## 2023-05-08 DIAGNOSIS — E785 Hyperlipidemia, unspecified: Secondary | ICD-10-CM | POA: Diagnosis not present

## 2023-05-08 DIAGNOSIS — N1831 Chronic kidney disease, stage 3a: Secondary | ICD-10-CM | POA: Diagnosis not present

## 2023-05-08 DIAGNOSIS — D500 Iron deficiency anemia secondary to blood loss (chronic): Secondary | ICD-10-CM | POA: Diagnosis not present

## 2023-05-08 DIAGNOSIS — Z0001 Encounter for general adult medical examination with abnormal findings: Secondary | ICD-10-CM | POA: Diagnosis not present

## 2023-05-08 DIAGNOSIS — E538 Deficiency of other specified B group vitamins: Secondary | ICD-10-CM

## 2023-05-08 DIAGNOSIS — R739 Hyperglycemia, unspecified: Secondary | ICD-10-CM

## 2023-05-08 DIAGNOSIS — E039 Hypothyroidism, unspecified: Secondary | ICD-10-CM | POA: Diagnosis not present

## 2023-05-08 DIAGNOSIS — R413 Other amnesia: Secondary | ICD-10-CM

## 2023-05-08 LAB — BASIC METABOLIC PANEL
BUN: 16 mg/dL (ref 6–23)
CO2: 28 mEq/L (ref 19–32)
Calcium: 10.5 mg/dL (ref 8.4–10.5)
Chloride: 101 mEq/L (ref 96–112)
Creatinine, Ser: 1.21 mg/dL — ABNORMAL HIGH (ref 0.40–1.20)
GFR: 44.67 mL/min — ABNORMAL LOW (ref >60.00)
Glucose, Bld: 105 mg/dL — ABNORMAL HIGH (ref 70–99)
Potassium: 3.2 mEq/L — ABNORMAL LOW (ref 3.5–5.1)
Sodium: 140 mEq/L (ref 135–145)

## 2023-05-08 LAB — CBC WITH DIFFERENTIAL/PLATELET
Basophils Absolute: 0 10*3/uL (ref 0.0–0.1)
Basophils Relative: 0.3 % (ref 0.0–3.0)
Eosinophils Absolute: 0.4 10*3/uL (ref 0.0–0.7)
Eosinophils Relative: 5.3 % — ABNORMAL HIGH (ref 0.0–5.0)
HCT: 43.6 % (ref 36.0–46.0)
Hemoglobin: 14.8 g/dL (ref 12.0–15.0)
Lymphocytes Relative: 37.9 % (ref 12.0–46.0)
Lymphs Abs: 3.1 10*3/uL (ref 0.7–4.0)
MCHC: 34 g/dL (ref 30.0–36.0)
MCV: 90.5 fl (ref 78.0–100.0)
Monocytes Absolute: 0.5 10*3/uL (ref 0.1–1.0)
Monocytes Relative: 6.5 % (ref 3.0–12.0)
Neutro Abs: 4.1 10*3/uL (ref 1.4–7.7)
Neutrophils Relative %: 50 % (ref 43.0–77.0)
Platelets: 263 10*3/uL (ref 150.0–400.0)
RBC: 4.82 Mil/uL (ref 3.87–5.11)
RDW: 14.3 % (ref 11.5–15.5)
WBC: 8.1 10*3/uL (ref 4.0–10.5)

## 2023-05-08 LAB — URINALYSIS, ROUTINE W REFLEX MICROSCOPIC
Hgb urine dipstick: NEGATIVE
Ketones, ur: 15 — AB
Nitrite: POSITIVE — AB
Specific Gravity, Urine: 1.02 (ref 1.000–1.030)
Total Protein, Urine: 30 — AB
Urine Glucose: NEGATIVE
Urobilinogen, UA: 1 (ref 0.0–1.0)
pH: 6 (ref 5.0–8.0)

## 2023-05-08 LAB — MICROALBUMIN / CREATININE URINE RATIO
Creatinine,U: 314 mg/dL
Microalb Creat Ratio: 2.3 mg/g (ref 0.0–30.0)
Microalb, Ur: 7.2 mg/dL — ABNORMAL HIGH (ref 0.0–1.9)

## 2023-05-08 LAB — HEPATIC FUNCTION PANEL
ALT: 25 U/L (ref 0–35)
AST: 35 U/L (ref 0–37)
Albumin: 4.1 g/dL (ref 3.5–5.2)
Alkaline Phosphatase: 70 U/L (ref 39–117)
Bilirubin, Direct: 0.1 mg/dL (ref 0.0–0.3)
Total Bilirubin: 0.7 mg/dL (ref 0.2–1.2)
Total Protein: 7.6 g/dL (ref 6.0–8.3)

## 2023-05-08 LAB — IBC PANEL
Iron: 81 ug/dL (ref 42–145)
Saturation Ratios: 17.9 % — ABNORMAL LOW (ref 20.0–50.0)
TIBC: 452.2 ug/dL — ABNORMAL HIGH (ref 250.0–450.0)
Transferrin: 323 mg/dL (ref 212.0–360.0)

## 2023-05-08 LAB — LIPID PANEL
Cholesterol: 185 mg/dL (ref 0–200)
HDL: 58.7 mg/dL (ref >39.00)
NonHDL: 126.27
Total CHOL/HDL Ratio: 3
Triglycerides: 280 mg/dL — ABNORMAL HIGH (ref 0.0–149.0)
VLDL: 56 mg/dL — ABNORMAL HIGH (ref 0.0–40.0)

## 2023-05-08 LAB — LDL CHOLESTEROL, DIRECT: Direct LDL: 94 mg/dL

## 2023-05-08 LAB — HEMOGLOBIN A1C: Hgb A1c MFr Bld: 5.3 % (ref 4.6–6.5)

## 2023-05-08 MED ORDER — VENLAFAXINE HCL ER 150 MG PO CP24: 150.0000 mg | ORAL_CAPSULE | Freq: Every day | ORAL | 3 refills | Status: DC

## 2023-05-08 NOTE — Patient Instructions (Signed)
Please continue all other medications as before, and refills have been done if requested.  Please have the pharmacy call with any other refills you may need.  Please continue your efforts at being more active, low cholesterol diet, and weight control.  You are otherwise up to date with prevention measures today.  Please keep your appointments with your specialists as you may have planned  You will be contacted regarding the referral for: MRI, and neurology referral  Please go to the LAB at the blood drawing area for the tests to be done  You will be contacted by phone if any changes need to be made immediately.  Otherwise, you will receive a letter about your results with an explanation, but please check with MyChart first.  Please remember to sign up for MyChart if you have not done so, as this will be important to you in the future with finding out test results, communicating by private email, and scheduling acute appointments online when needed.  Please make an Appointment to return in 6 months, or sooner if needed

## 2023-05-08 NOTE — Assessment & Plan Note (Signed)
Etiology unclear, ? Early dementia vs pseudodementia, for MRI brain, and neurology referral

## 2023-05-08 NOTE — Assessment & Plan Note (Signed)
Lab Results  Component Value Date   CREATININE 0.93 04/21/2022   Stable overall, cont to avoid nephrotoxins, for f/u lab today

## 2023-05-08 NOTE — Assessment & Plan Note (Signed)
Lab Results  Component Value Date   HGBA1C 5.3 04/21/2022   Stable, pt to continue current medical treatment  - diet, wt control

## 2023-05-08 NOTE — Assessment & Plan Note (Signed)
Lab Results  Component Value Date   LDLCALC 63 04/07/2021   Stable, pt to continue current statin crestor 40 qd

## 2023-05-08 NOTE — Assessment & Plan Note (Signed)

## 2023-05-08 NOTE — Assessment & Plan Note (Signed)
Lab Results  Component Value Date   TSH 4.35 04/21/2022   Stable, pt to continue levothyroxine 50 mcg qd 

## 2023-05-08 NOTE — Progress Notes (Signed)
Patient ID: Elizabeth Buckley, female   DOB: [birth year], 72 y.o.   MRN: [id number]         Chief Complaint:: wellness exam and memory change, depression anxiety, low thyroid, hld, hyperglycemia, ckd3a       HPI:  Elizabeth Buckley is a 72 y.o. female here for wellness exam. Up to date                        Also Denies worsening depressive symptoms, suicidal ideation, or panic; has ongoing anxiety,   Has had worsening memory with name recall as well as losing train of thought often, Husband notices and has tohelp her remember things now.  Pt denies chest pain, increased sob or doe, wheezing, orthopnea, PND, increased LE swelling, palpitations, dizziness or syncope.   Pt denies polydipsia, polyuria, or new focal neuro s/s.    Pt denies fever, wt loss, night sweats, loss of appetite, or other constitutional symptoms     Wt Readings from Last 3 Encounters:  05/08/23 208 lb (94.3 kg)  05/02/23 205 lb (93 kg)  04/06/23 205 lb (93 kg)   BP Readings from Last 3 Encounters:  05/08/23 126/78  05/02/23 (!) 142/80  10/25/22 116/62   Immunization History  Administered Date(s) Administered   Fluad Quad(high Dose 65+) 10/15/2021, 10/25/2022   H1N1 11/24/2008   Influenza Split 11/03/2011, 11/18/2012   Influenza Whole 09/21/2010   Influenza, High Dose Seasonal PF 08/13/2015, 09/27/2017, 02/05/2019   Influenza,inj,Quad PF,6+ Mos 01/22/2014, 08/07/2014   PFIZER(Purple Top)SARS-COV-2 Vaccination 02/20/2020, 03/18/2020, 09/21/2020, 01/19/2021   Pneumococcal Conjugate-13 08/27/2015   Pneumococcal Polysaccharide-23 08/12/2016   Td 09/21/2010   Tdap 04/07/2021   Zoster Recombinat (Shingrix) 01/25/2021, 04/14/2021  There are no preventive care reminders to display for this patient.    Past Medical History:  Diagnosis Date   Anemia    ANXIETY    BACK PAIN    Cataract    CONSTIPATION, CHRONIC    DEPRESSION    GERD (gastroesophageal reflux disease)    HYPERLIPIDEMIA    HYPERTENSION    NASH  (NON-ALCOHOLIC STEATOHEPATITIS)    Obesity, unspecified    OSTEOPENIA    Pneumonia    x3   Past Surgical History:  Procedure Laterality Date   ABDOMINAL HYSTERECTOMY     APPENDECTOMY     BLADDER SUSPENSION  01/18/2012   Procedure: TRANSVAGINAL TAPE (TVT) PROCEDURE;  Surgeon: Loney Laurence, MD;  Location: [facility];  Service: Gynecology;  Laterality: N/A;   BREAST BIOPSY Right    CATARACT EXTRACTION     both eyes   cataract surgery     left and right eye   CHOLECYSTECTOMY     COLONOSCOPY W/ BIOPSIES AND POLYPECTOMY  01/03/2003   submucosal lesion   CYSTOCELE REPAIR  01/18/2012   Procedure: ANTERIOR REPAIR (CYSTOCELE);  Surgeon: Loney Laurence, MD;  Location: [facility];  Service: Gynecology;  Laterality: N/A;   CYSTOSCOPY  01/18/2012   Procedure: CYSTOSCOPY;  Surgeon: Loney Laurence, MD;  Location: [facility];  Service: Gynecology;  Laterality: N/A;   ELECTROCARDIOGRAM  07/27/2006   s/p liver biopsy     Dr. Leone Payor   s/p lumbar surgery     trigger finger repair left thumb     TUBAL LIGATION     UPPER GASTROINTESTINAL ENDOSCOPY  01/03/2003    reports that she quit smoking about 22 years ago. Her smoking use included cigarettes. She has never used smokeless tobacco. She reports  current alcohol use. She reports that she does not use drugs. family history includes Alcohol abuse in an other family member; Heart disease in her father; Hyperlipidemia in an other family member; Hypertension in an other family member. Allergies  Allergen Reactions   Codeine Nausea Only   Current Outpatient Medications on File Prior to Visit  Medication Sig Dispense Refill   albuterol (PROVENTIL HFA;VENTOLIN HFA) 108 (90 Base) MCG/ACT inhaler Inhale 2 puffs into the lungs every 6 (six) hours as needed for wheezing or shortness of breath. 1 Inhaler 2   ALPRAZolam (XANAX) 0.25 MG tablet Take 1 tablet (0.25 mg total) by mouth 2 (two) times daily as needed. for anxiety 60 tablet 5   aspirin 81 MG tablet Take 81  mg by mouth daily.     Cholecalciferol (VITAMIN D3) 1000 UNITS CAPS Take 5,000 Units by mouth daily.     ibuprofen (ADVIL,MOTRIN) 200 MG tablet Take 400 mg by mouth daily.     levothyroxine (EUTHYROX) 50 MCG tablet TAKE 1 TABLET BY MOUTH ONCE DAILY 90 tablet 3   meclizine (ANTIVERT) 12.5 MG tablet Take 1 tablet (12.5 mg total) by mouth 3 (three) times daily as needed. 30 tablet 2   metoprolol succinate (TOPROL XL) 25 MG 24 hr tablet Take 1 tablet (25 mg total) by mouth daily. 90 tablet 3   polyethylene glycol (MIRALAX / GLYCOLAX) packet Take 17 g by mouth daily as needed. For constipation 14 each 6   potassium chloride (KLOR-CON 10) 10 MEQ tablet Take 1 tablet (10 mEq total) by mouth daily. 90 tablet 3   rosuvastatin (CRESTOR) 40 MG tablet Take 1 tablet (40 mg total) by mouth daily. 90 tablet 3   traMADol (ULTRAM) 50 MG tablet Take 1 tablet (50 mg total) by mouth every 6 (six) hours as needed. 30 tablet 0   triamcinolone (NASACORT) 55 MCG/ACT AERO nasal inhaler Place 2 sprays into the nose daily. 1 each 12   triamterene-hydrochlorothiazide (MAXZIDE-25) 37.5-25 MG tablet Take 1 tablet by mouth daily. 90 tablet 3   No current facility-administered medications on file prior to visit.        ROS:  All others reviewed and negative.  Objective        PE:  BP 126/78 (BP Location: Left Arm, Patient Position: Sitting, Cuff Size: Normal)   Pulse 88   Temp 98.3 F (36.8 C) (Oral)   Ht 5\' 3"  (1.6 m)   Wt 208 lb (94.3 kg)   SpO2 95%   BMI 36.85 kg/m                 Constitutional: Pt appears in NAD               HENT: Head: NCAT.                Right Ear: External ear normal.                 Left Ear: External ear normal.                Eyes: . Pupils are equal, round, and reactive to light. Conjunctivae and EOM are normal               Nose: without d/c or deformity               Neck: Neck supple. Gross normal ROM               Cardiovascular: Normal rate and regular  rhythm.                  Pulmonary/Chest: Effort normal and breath sounds without rales or wheezing.                Abd:  Soft, NT, ND, + BS, no organomegaly               Neurological: Pt is alert. At baseline orientation, motor grossly intact, has some short term recall difficutly today               Skin: Skin is warm. No rashes, no other new lesions, LE edema - none               Psychiatric: Pt behavior is normal without agitation   Micro: none  Cardiac tracings I have personally interpreted today:  none  Pertinent Radiological findings (summarize): none   Lab Results  Component Value Date   WBC 7.3 04/21/2022   HGB 14.8 04/21/2022   HCT 42.9 04/21/2022   PLT 231.0 04/21/2022   GLUCOSE 88 04/21/2022   CHOL 176 04/21/2022   TRIG 263.0 (H) 04/21/2022   HDL 52.30 04/21/2022   LDLDIRECT 99.0 04/21/2022   LDLCALC 63 04/07/2021   ALT 21 04/21/2022   AST 32 04/21/2022   NA 132 (L) 04/21/2022   K 2.9 (L) 04/21/2022   CL 93 (L) 04/21/2022   CREATININE 0.93 04/21/2022   BUN 13 04/21/2022   CO2 31 04/21/2022   TSH 4.35 04/21/2022   INR 1.0 11/10/2008   HGBA1C 5.3 04/21/2022   Assessment/Plan:  Elizabeth Buckley is a 73 y.o. White or Caucasian [1] female with  has a past medical history of Anemia, ANXIETY, BACK PAIN, Cataract, CONSTIPATION, CHRONIC, DEPRESSION, GERD (gastroesophageal reflux disease), HYPERLIPIDEMIA, HYPERTENSION, NASH (NON-ALCOHOLIC STEATOHEPATITIS), Obesity, unspecified, OSTEOPENIA, and Pneumonia.  Encounter for well adult exam with abnormal findings Age and sex appropriate education and counseling updated with regular exercise and diet Referrals for preventative services - none needed Immunizations addressed - none needed Smoking counseling  - none needed Evidence for depression or other mood disorder - none significant Most recent labs reviewed. I have personally reviewed and have noted: 1) the patient's medical and social history 2) The patient's current medications and  supplements 3) The patient's height, weight, and BMI have been recorded in the chart   Memory change Etiology unclear, ? Early dementia vs pseudodementia, for MRI brain, and neurology referral  Depression Stable oversall, for effexor refill  Hypothyroidism Lab Results  Component Value Date   TSH 4.35 04/21/2022   Stable, pt to continue levothyroxine 50 mcg qd   Hyperlipidemia Lab Results  Component Value Date   LDLCALC 63 04/07/2021   Stable, pt to continue current statin crestor 40 qd   Hyperglycemia Lab Results  Component Value Date   HGBA1C 5.3 04/21/2022   Stable, pt to continue current medical treatment  - diet, wt control   CKD (chronic kidney disease) Lab Results  Component Value Date   CREATININE 0.93 04/21/2022   Stable overall, cont to avoid nephrotoxins, for f/u lab today  Followup: Return in about 6 months (around 11/08/2023).  Oliver Barre, MD 05/08/2023 2:31 PM [facility]

## 2023-05-08 NOTE — Assessment & Plan Note (Signed)
Stable oversall, for effexor refill

## 2023-05-09 ENCOUNTER — Other Ambulatory Visit: Payer: Self-pay | Admitting: Internal Medicine

## 2023-05-09 LAB — VITAMIN B12: Vitamin B-12: 1113 pg/mL — ABNORMAL HIGH (ref 211–911)

## 2023-05-09 LAB — VITAMIN D 25 HYDROXY (VIT D DEFICIENCY, FRACTURES): VITD: 52.82 ng/mL (ref 30.00–100.00)

## 2023-05-09 LAB — FERRITIN: Ferritin: 12.5 ng/mL (ref 10.0–291.0)

## 2023-05-09 LAB — TSH: TSH: 5.61 u[IU]/mL — ABNORMAL HIGH (ref 0.35–5.50)

## 2023-05-09 MED ORDER — POTASSIUM CHLORIDE ER 10 MEQ PO TBCR: 20.0000 meq | EXTENDED_RELEASE_TABLET | Freq: Every day | ORAL | 3 refills | Status: DC

## 2023-05-11 ENCOUNTER — Encounter: Payer: Self-pay | Admitting: Internal Medicine

## 2023-05-16 ENCOUNTER — Telehealth: Payer: Self-pay

## 2023-05-16 DIAGNOSIS — E876 Hypokalemia: Secondary | ICD-10-CM

## 2023-05-16 NOTE — Telephone Encounter (Signed)
Not sure what to say as the potassium should not be causing this, but we can recheck the bmp please - I have placed the orders

## 2023-05-16 NOTE — Addendum Note (Signed)
Addended by: Corwin Levins on: 05/16/2023 12:46 PM   Modules accepted: Orders

## 2023-05-16 NOTE — Telephone Encounter (Signed)
Pt is asking to speak with Dr. Jonny Ruiz or his nurse as she started taking her potassium at double dose equaling 20 MEQ and pt states when doing so she has gotten dizzy, weak and fluttering of her heart. Pt is wanting Dr. Jonny Ruiz to inform her on what to do next.  Please call pt at [phone]

## 2023-05-16 NOTE — Telephone Encounter (Signed)
Called and let Pt know

## 2023-05-17 ENCOUNTER — Other Ambulatory Visit (INDEPENDENT_AMBULATORY_CARE_PROVIDER_SITE_OTHER): Payer: Medicare Other

## 2023-05-17 ENCOUNTER — Ambulatory Visit
Admission: RE | Admit: 2023-05-17 | Discharge: 2023-05-17 | Disposition: A | Discharge status: Home / Self Care | Payer: Medicare Other | Source: Ambulatory Visit | Attending: Internal Medicine | Admitting: Internal Medicine

## 2023-05-17 DIAGNOSIS — Z1231 Encounter for screening mammogram for malignant neoplasm of breast: Secondary | ICD-10-CM | POA: Diagnosis not present

## 2023-05-17 DIAGNOSIS — E876 Hypokalemia: Secondary | ICD-10-CM | POA: Diagnosis not present

## 2023-05-17 LAB — BASIC METABOLIC PANEL
BUN: 17 mg/dL (ref 6–23)
CO2: 27 mEq/L (ref 19–32)
Calcium: 10.5 mg/dL (ref 8.4–10.5)
Chloride: 96 mEq/L (ref 96–112)
Creatinine, Ser: 1.25 mg/dL — ABNORMAL HIGH (ref 0.40–1.20)
GFR: 42.95 mL/min — ABNORMAL LOW (ref >60.00)
Glucose, Bld: 115 mg/dL — ABNORMAL HIGH (ref 70–99)
Potassium: 2.9 mEq/L — ABNORMAL LOW (ref 3.5–5.1)
Sodium: 135 mEq/L (ref 135–145)

## 2023-05-19 ENCOUNTER — Other Ambulatory Visit: Payer: Self-pay | Admitting: Internal Medicine

## 2023-05-19 MED ORDER — POTASSIUM CHLORIDE ER 10 MEQ PO TBCR: 30.0000 meq | EXTENDED_RELEASE_TABLET | Freq: Every day | ORAL | 3 refills | Status: DC

## 2023-05-22 MED ORDER — LOSARTAN POTASSIUM 50 MG PO TABS
50.0000 mg | ORAL_TABLET | Freq: Every day | ORAL | 3 refills | Status: DC
Start: 2023-05-22 — End: 2023-11-15

## 2023-05-22 NOTE — Progress Notes (Signed)
Ok  - so if ok with pt, to avoid the low potassium then we can stop the potassium, and change the fluid pill to Losartan 50 mg per day which does not have the low K problem  Ok to d/c the potassium and triamterene-hct medication-   Ok to start losartan 50 mg per day - I will send 

## 2023-05-22 NOTE — Progress Notes (Signed)
Ok  - so if ok with pt, to avoid the low potassium then we can stop the potassium, and change the fluid pill to Losartan 50 mg per day which does not have the low K problem  Ok to d/c the potassium and triamterene-hct medication-   Ok to start losartan 50 mg per day - I will send

## 2023-05-29 ENCOUNTER — Other Ambulatory Visit: Payer: Self-pay | Admitting: Internal Medicine

## 2023-06-05 ENCOUNTER — Ambulatory Visit
Admission: RE | Admit: 2023-06-05 | Discharge: 2023-06-05 | Disposition: A | Discharge status: Home / Self Care | Payer: Medicare Other | Source: Ambulatory Visit | Attending: Internal Medicine | Admitting: Internal Medicine

## 2023-06-05 DIAGNOSIS — R413 Other amnesia: Secondary | ICD-10-CM

## 2023-06-05 DIAGNOSIS — R296 Repeated falls: Secondary | ICD-10-CM | POA: Diagnosis not present

## 2023-06-05 DIAGNOSIS — G319 Degenerative disease of nervous system, unspecified: Secondary | ICD-10-CM | POA: Diagnosis not present

## 2023-08-15 ENCOUNTER — Ambulatory Visit (INDEPENDENT_AMBULATORY_CARE_PROVIDER_SITE_OTHER): Payer: Medicare Other | Admitting: Diagnostic Neuroimaging

## 2023-08-15 ENCOUNTER — Encounter: Payer: Self-pay | Admitting: Diagnostic Neuroimaging

## 2023-08-15 VITALS — BP 130/81 | HR 73 | Ht 63.0 in | Wt 205.0 lb

## 2023-08-15 DIAGNOSIS — R413 Other amnesia: Secondary | ICD-10-CM | POA: Diagnosis not present

## 2023-08-15 NOTE — Progress Notes (Signed)
[facility]  PATIENT: Elizabeth Buckley DOB: 1951/06/06  REFERRING CLINICIAN: Corwin Levins, MD HISTORY FROM: patient  REASON FOR VISIT: new consult   HISTORICAL  CHIEF COMPLAINT:  Chief Complaint  Patient presents with   New Patient (Initial Visit)    Rm 7, here alone Pt is here for memory changes. Pt states she is forgetting, names and things. States if she is interrupted in the middle of conversation she wont remember what she was talking about.     HISTORY OF PRESENT ILLNESS:   72 year old female here for evaluation of mild memory loss.  The past 4 to 5 years has had some mild word finding difficulties, names of people, forgetting words.  Sometimes she uses associations in the mild devices to remember these words.  Otherwise she maintains all of her ADLs.  She is high functioning.  She has a good organizational schedule at home.  She is attentive to her nutrition, exercise, sleep and stress management.  Patient request evaluation mainly for evaluation of prevention.   REVIEW OF SYSTEMS: Full 14 system review of systems performed and negative with exception of: as per HPI.  ALLERGIES: Allergies  Allergen Reactions   Codeine Nausea Only    HOME MEDICATIONS: Outpatient Medications Prior to Visit  Medication Sig Dispense Refill   albuterol (PROVENTIL HFA;VENTOLIN HFA) 108 (90 Base) MCG/ACT inhaler Inhale 2 puffs into the lungs every 6 (six) hours as needed for wheezing or shortness of breath. 1 Inhaler 2   ALPRAZolam (XANAX) 0.25 MG tablet Take 1 tablet (0.25 mg total) by mouth 2 (two) times daily as needed. for anxiety 60 tablet 5   aspirin 81 MG tablet Take 81 mg by mouth daily.     Cholecalciferol (VITAMIN D3) 1000 UNITS CAPS Take 5,000 Units by mouth daily.     ibuprofen (ADVIL,MOTRIN) 200 MG tablet Take 400 mg by mouth daily.     levothyroxine (EUTHYROX) 50 MCG tablet TAKE 1 TABLET BY MOUTH ONCE DAILY 90 tablet 3   losartan (COZAAR) 50 MG tablet  Take 1 tablet (50 mg total) by mouth daily. 90 tablet 3   meclizine (ANTIVERT) 12.5 MG tablet Take 1 tablet (12.5 mg total) by mouth 3 (three) times daily as needed. 30 tablet 2   metoprolol succinate (TOPROL XL) 25 MG 24 hr tablet Take 1 tablet (25 mg total) by mouth daily. 90 tablet 3   polyethylene glycol (MIRALAX / GLYCOLAX) packet Take 17 g by mouth daily as needed. For constipation 14 each 6   potassium chloride (KLOR-CON 10) 10 MEQ tablet Take 3 tablets (30 mEq total) by mouth daily. 270 tablet 3   traMADol (ULTRAM) 50 MG tablet Take 1 tablet (50 mg total) by mouth every 6 (six) hours as needed. 30 tablet 0   triamcinolone (NASACORT) 55 MCG/ACT AERO nasal inhaler Place 2 sprays into the nose daily. 1 each 12   venlafaxine XR (EFFEXOR-XR) 150 MG 24 hr capsule Take 1 capsule (150 mg total) by mouth daily with breakfast. 90 capsule 3   rosuvastatin (CRESTOR) 40 MG tablet Take 1 tablet (40 mg total) by mouth daily. (Patient not taking: Reported on 08/15/2023) 90 tablet 3   No facility-administered medications prior to visit.    PAST MEDICAL HISTORY: Past Medical History:  Diagnosis Date   Anemia    ANXIETY    BACK PAIN    Cataract    CONSTIPATION, CHRONIC    DEPRESSION    GERD (gastroesophageal reflux disease)  HYPERLIPIDEMIA    HYPERTENSION    NASH (NON-ALCOHOLIC STEATOHEPATITIS)    Obesity, unspecified    OSTEOPENIA    Pneumonia    x3    PAST SURGICAL HISTORY: Past Surgical History:  Procedure Laterality Date   ABDOMINAL HYSTERECTOMY     APPENDECTOMY     BLADDER SUSPENSION  01/18/2012   Procedure: TRANSVAGINAL TAPE (TVT) PROCEDURE;  Surgeon: Loney Laurence, MD;  Location: [facility];  Service: Gynecology;  Laterality: N/A;   BREAST BIOPSY Right    CATARACT EXTRACTION     both eyes   cataract surgery     left and right eye   CHOLECYSTECTOMY     COLONOSCOPY W/ BIOPSIES AND POLYPECTOMY  01/03/2003   submucosal lesion   CYSTOCELE REPAIR  01/18/2012   Procedure: ANTERIOR  REPAIR (CYSTOCELE);  Surgeon: Loney Laurence, MD;  Location: [facility];  Service: Gynecology;  Laterality: N/A;   CYSTOSCOPY  01/18/2012   Procedure: CYSTOSCOPY;  Surgeon: Loney Laurence, MD;  Location: [facility];  Service: Gynecology;  Laterality: N/A;   ELECTROCARDIOGRAM  07/27/2006   s/p liver biopsy     Dr. Leone Payor   s/p lumbar surgery     trigger finger repair left thumb     TUBAL LIGATION     UPPER GASTROINTESTINAL ENDOSCOPY  01/03/2003    FAMILY HISTORY: Family History  Problem Relation Age of Onset   Heart disease Father    Alcohol abuse Other    Hyperlipidemia Other    Hypertension Other    Colon polyps Neg Hx    Esophageal cancer Neg Hx    Stomach cancer Neg Hx    Rectal cancer Neg Hx     SOCIAL HISTORY: Social History   Socioeconomic History   Marital status: Married    Spouse name: Not on file   Number of children: 1   Years of education: Not on file   Highest education level: Not on file  Occupational History   Occupation: disabled Systems developer  Tobacco Use   Smoking status: Former    Current packs/day: 0.00    Types: Cigarettes    Quit date: 01/08/2001    Years since quitting: 22.6   Smokeless tobacco: Never  Vaping Use   Vaping status: Never Used  Substance and Sexual Activity   Alcohol use: Yes    Comment: occasion 3-4 x yr   Drug use: No   Sexual activity: Not on file  Other Topics Concern   Not on file  Social History Narrative   Daily caffeine, 2 cups daily   Patient does not get regular exercise   Social Determinants of Health   Financial Resource Strain: Low Risk  (10/26/2022)   Overall Financial Resource Strain (CARDIA)    Difficulty of Paying Living Expenses: Not hard at all  Food Insecurity: No Food Insecurity (10/26/2022)   Hunger Vital Sign    Worried About Running Out of Food in the Last Year: Never true    Ran Out of Food in the Last Year: Never true  Transportation Needs: No Transportation Needs (10/26/2022)   PRAPARE -  Administrator, Civil Service (Medical): No    Lack of Transportation (Non-Medical): No  Physical Activity: Inactive (10/26/2022)   Exercise Vital Sign    Days of Exercise per Week: 0 days    Minutes of Exercise per Session: 0 min  Stress: No Stress Concern Present (10/26/2022)   [employer] of Occupational Health - Occupational Stress Questionnaire  Feeling of Stress : Not at all  Social Connections: Unknown (10/26/2022)   Social Connection and Isolation Panel [NHANES]    Frequency of Communication with Friends and Family: More than three times a week    Frequency of Social Gatherings with Friends and Family: Three times a week    Attends Religious Services: Patient declined    Active Member of Clubs or Organizations: Patient declined    Attends Banker Meetings: Patient declined    Marital Status: Married  Catering manager Violence: Not At Risk (10/26/2022)   Humiliation, Afraid, Rape, and Kick questionnaire    Fear of Current or Ex-Partner: No    Emotionally Abused: No    Physically Abused: No    Sexually Abused: No     PHYSICAL EXAM  GENERAL EXAM/CONSTITUTIONAL: Vitals:  Vitals:   08/15/23 1150  BP: 130/81  Pulse: 73  Weight: 205 lb (93 kg)  Height: 5\' 3"  (1.6 m)   Body mass index is 36.31 kg/m. Wt Readings from Last 3 Encounters:  08/15/23 205 lb (93 kg)  05/08/23 208 lb (94.3 kg)  05/02/23 205 lb (93 kg)   Patient is in no distress; well developed, nourished and groomed; neck is supple  CARDIOVASCULAR: Examination of carotid arteries is normal; no carotid bruits Regular rate and rhythm, no murmurs Examination of peripheral vascular system by observation and palpation is normal  EYES: Ophthalmoscopic exam of optic discs and posterior segments is normal; no papilledema or hemorrhages No results found.  MUSCULOSKELETAL: Gait, strength, tone, movements noted in Neurologic exam below  NEUROLOGIC: MENTAL STATUS:     08/15/2023    11:53 AM  MMSE - Mini Mental State Exam  Orientation to time 5  Orientation to Place 5  Registration 3  Attention/ Calculation 5  Recall 3  Language- name 2 objects 2  Language- repeat 1  Language- follow 3 step command 2  Language- read & follow direction 1  Write a sentence 1  Copy design 0  Total score 28   awake, alert, oriented to person, place and time recent and remote memory intact normal attention and concentration language fluent, comprehension intact, naming intact fund of knowledge appropriate  CRANIAL NERVE:  2nd - no papilledema on fundoscopic exam 2nd, 3rd, 4th, 6th - pupils equal and reactive to light, visual fields full to confrontation, extraocular muscles intact, no nystagmus 5th - facial sensation symmetric 7th - facial strength symmetric 8th - hearing intact 9th - palate elevates symmetrically, uvula midline 11th - shoulder shrug symmetric 12th - tongue protrusion midline  MOTOR:  normal bulk and tone, full strength in the BUE, BLE  SENSORY:  normal and symmetric to light touch, temperature, vibration  COORDINATION:  finger-nose-finger, fine finger movements normal  REFLEXES:  deep tendon reflexes present and symmetric  GAIT/STATION:  narrow based gait     DIAGNOSTIC DATA (LABS, IMAGING, TESTING) - I reviewed patient records, labs, notes, testing and imaging myself where available.  Lab Results  Component Value Date   WBC 8.1 05/08/2023   HGB 14.8 05/08/2023   HCT 43.6 05/08/2023   MCV 90.5 05/08/2023   PLT 263.0 05/08/2023      Component Value Date/Time   NA 135 05/17/2023 1419   K 2.9 (L) 05/17/2023 1419   CL 96 05/17/2023 1419   CO2 27 05/17/2023 1419   GLUCOSE 115 (H) 05/17/2023 1419   BUN 17 05/17/2023 1419   CREATININE 1.25 (H) 05/17/2023 1419   CALCIUM 10.5 05/17/2023 1419  PROT 7.6 05/08/2023 1433   ALBUMIN 4.1 05/08/2023 1433   AST 35 05/08/2023 1433   ALT 25 05/08/2023 1433   ALKPHOS 70 05/08/2023 1433    BILITOT 0.7 05/08/2023 1433   GFRNONAA 90 (L) 01/09/2012 1108   GFRAA >90 01/09/2012 1108   Lab Results  Component Value Date   CHOL 185 05/08/2023   HDL 58.70 05/08/2023   LDLCALC 63 04/07/2021   LDLDIRECT 94.0 05/08/2023   TRIG 280.0 (H) 05/08/2023   CHOLHDL 3 05/08/2023   Lab Results  Component Value Date   HGBA1C 5.3 05/08/2023   Lab Results  Component Value Date   VITAMINB12 1,113 (H) 05/08/2023   Lab Results  Component Value Date   TSH 5.61 (H) 05/08/2023    06/05/23 MRI brain (without) [I reviewed images myself and agree with interpretation. Mild chronic small vessel ischemic disease. -VRP]  1.  No evidence of an acute intracranial abnormality. 2. Moderate chronic small vessel ischemic changes within the cerebral white matter. 3. Punctate chronic microhemorrhage within the callosal splenium. 4. Mild generalized cerebral atrophy.    ASSESSMENT AND PLAN  72 y.o. year old female here with:   Dx:  1. Mild memory disturbance     PLAN:  MILD MEMORY LOSS / WORD FINDING DIFF (since ~2020; no changes in ADLs) - likely normal aging; no signs of dementia - daily physical activity / exercise (at least 15-30 minutes) - eat more plants / vegetables - increase social activities, brain stimulation, games, puzzles, hobbies, crafts, arts, music - aim for at least 7-8 hours sleep per night (or more) - avoid smoking and alcohol  Return for pending if symptoms worsen or fail to improve, pending test results.    Suanne Marker, MD 08/15/2023, 12:39 PM Certified in Neurology, Neurophysiology and Neuroimaging  [facility] [place] [facility] [address] [place], [place] [address] [phone]

## 2023-08-15 NOTE — Patient Instructions (Signed)
  MILD MEMORY LOSS / WORD FINDING DIFF - likely normal aging; no signs of dementia - daily physical activity / exercise (at least 15-30 minutes) - eat more plants / vegetables - increase social activities, brain stimulation, games, puzzles, hobbies, crafts, arts, music - aim for at least 7-8 hours sleep per night (or more) - avoid smoking and alcohol

## 2023-09-04 ENCOUNTER — Other Ambulatory Visit: Payer: Self-pay | Admitting: Internal Medicine

## 2023-09-04 ENCOUNTER — Other Ambulatory Visit: Payer: Self-pay

## 2023-09-05 ENCOUNTER — Other Ambulatory Visit: Payer: Self-pay | Admitting: Internal Medicine

## 2023-09-07 DIAGNOSIS — H524 Presbyopia: Secondary | ICD-10-CM | POA: Diagnosis not present

## 2023-09-07 DIAGNOSIS — H04123 Dry eye syndrome of bilateral lacrimal glands: Secondary | ICD-10-CM | POA: Diagnosis not present

## 2023-09-12 ENCOUNTER — Other Ambulatory Visit: Payer: Self-pay | Admitting: Internal Medicine

## 2023-09-15 DIAGNOSIS — Z0000 Encounter for general adult medical examination without abnormal findings: Secondary | ICD-10-CM

## 2023-09-15 NOTE — Progress Notes (Signed)
This encounter was created in error - please disregard. I called and patient said that she asked for this visit to be rescheduled due to a death in her family.  She will call office back to reschedule visit.

## 2023-09-27 ENCOUNTER — Telehealth: Payer: Self-pay | Admitting: Internal Medicine

## 2023-09-27 MED ORDER — TRAMADOL HCL 50 MG PO TABS
50.0000 mg | ORAL_TABLET | Freq: Four times a day (QID) | ORAL | 2 refills | Status: AC | PRN
Start: 2023-09-27 — End: ?

## 2023-09-27 MED ORDER — ALPRAZOLAM 0.25 MG PO TABS: 0.2500 mg | ORAL_TABLET | Freq: Two times a day (BID) | ORAL | 2 refills | Status: AC | PRN

## 2023-09-27 NOTE — Telephone Encounter (Signed)
Done erx 

## 2023-09-27 NOTE — Telephone Encounter (Signed)
Potassium refill too soon.

## 2023-09-27 NOTE — Telephone Encounter (Signed)
Prescription Request  09/27/2023  LOV: 05/08/2023  What is the name of the medication or equipment?  potassium chloride (KLOR-CON 10) 10 MEQ tablet  traMADol (ULTRAM) 50 MG tablet  ALPRAZolam (XANAX) 0.25 MG tablet   Have you contacted your pharmacy to request a refill? No   Which pharmacy would you like this sent to?  [facility] [address] [place] - [address] [place] [address] Phone: [phone] Fax: [phone]    Patient notified that their request is being sent to the clinical staff for review and that they should receive a response within 2 business days.   Please advise at Mobile [phone] (mobile)

## 2023-11-08 ENCOUNTER — Other Ambulatory Visit: Payer: Self-pay

## 2023-11-08 ENCOUNTER — Ambulatory Visit: Payer: Medicare Other | Admitting: Internal Medicine

## 2023-11-08 ENCOUNTER — Telehealth: Payer: Self-pay | Admitting: Internal Medicine

## 2023-11-08 MED ORDER — METOPROLOL SUCCINATE ER 25 MG PO TB24: 25.0000 mg | ORAL_TABLET | Freq: Every day | ORAL | 3 refills | Status: DC

## 2023-11-08 NOTE — Telephone Encounter (Signed)
Spoke with Pt and sent In medication refill.

## 2023-11-08 NOTE — Telephone Encounter (Signed)
Pt has question about her blood pills and wants to speak with a Nurse. Please advise.

## 2023-11-15 ENCOUNTER — Other Ambulatory Visit: Payer: Self-pay | Admitting: Internal Medicine

## 2023-11-15 ENCOUNTER — Ambulatory Visit (INDEPENDENT_AMBULATORY_CARE_PROVIDER_SITE_OTHER): Payer: Medicare Other | Admitting: Internal Medicine

## 2023-11-15 ENCOUNTER — Encounter: Payer: Self-pay | Admitting: Internal Medicine

## 2023-11-15 VITALS — BP 132/84 | HR 60 | Temp 98.1°F | Ht 63.0 in | Wt 203.0 lb

## 2023-11-15 DIAGNOSIS — N1831 Chronic kidney disease, stage 3a: Secondary | ICD-10-CM | POA: Diagnosis not present

## 2023-11-15 DIAGNOSIS — E785 Hyperlipidemia, unspecified: Secondary | ICD-10-CM | POA: Diagnosis not present

## 2023-11-15 DIAGNOSIS — E876 Hypokalemia: Secondary | ICD-10-CM

## 2023-11-15 DIAGNOSIS — Z23 Encounter for immunization: Secondary | ICD-10-CM

## 2023-11-15 DIAGNOSIS — E039 Hypothyroidism, unspecified: Secondary | ICD-10-CM | POA: Diagnosis not present

## 2023-11-15 DIAGNOSIS — J309 Allergic rhinitis, unspecified: Secondary | ICD-10-CM | POA: Diagnosis not present

## 2023-11-15 DIAGNOSIS — I10 Essential (primary) hypertension: Secondary | ICD-10-CM

## 2023-11-15 DIAGNOSIS — R739 Hyperglycemia, unspecified: Secondary | ICD-10-CM

## 2023-11-15 LAB — HEPATIC FUNCTION PANEL
ALT: 30 U/L (ref 0–35)
AST: 30 U/L (ref 0–37)
Albumin: 4.1 g/dL (ref 3.5–5.2)
Alkaline Phosphatase: 100 U/L (ref 39–117)
Bilirubin, Direct: 0 mg/dL (ref 0.0–0.3)
Total Bilirubin: 0.7 mg/dL (ref 0.2–1.2)
Total Protein: 7.5 g/dL (ref 6.0–8.3)

## 2023-11-15 LAB — BASIC METABOLIC PANEL
BUN: 18 mg/dL (ref 6–23)
CO2: 27 meq/L (ref 19–32)
Calcium: 10.3 mg/dL (ref 8.4–10.5)
Chloride: 107 meq/L (ref 96–112)
Creatinine, Ser: 1.08 mg/dL (ref 0.40–1.20)
GFR: 51.01 mL/min — ABNORMAL LOW (ref >60.00)
Glucose, Bld: 85 mg/dL (ref 70–99)
Potassium: 3.6 meq/L (ref 3.5–5.1)
Sodium: 141 meq/L (ref 135–145)

## 2023-11-15 LAB — LIPID PANEL
Cholesterol: 300 mg/dL — ABNORMAL HIGH (ref 0–200)
HDL: 43.8 mg/dL (ref >39.00)
Total CHOL/HDL Ratio: 7
Triglycerides: 404 mg/dL — ABNORMAL HIGH (ref 0.0–149.0)

## 2023-11-15 LAB — LDL CHOLESTEROL, DIRECT: Direct LDL: 200 mg/dL

## 2023-11-15 LAB — HEMOGLOBIN A1C: Hgb A1c MFr Bld: 5.1 % (ref 4.6–6.5)

## 2023-11-15 MED ORDER — LOSARTAN POTASSIUM 50 MG PO TABS: 50.0000 mg | ORAL_TABLET | Freq: Every day | ORAL | 3 refills | Status: DC

## 2023-11-15 MED ORDER — POTASSIUM CHLORIDE ER 10 MEQ PO TBCR: 30.0000 meq | EXTENDED_RELEASE_TABLET | Freq: Every day | ORAL | 3 refills | Status: DC

## 2023-11-15 MED ORDER — METOPROLOL SUCCINATE ER 25 MG PO TB24: 25.0000 mg | ORAL_TABLET | Freq: Every day | ORAL | 3 refills | Status: DC

## 2023-11-15 MED ORDER — ALBUTEROL SULFATE HFA 108 (90 BASE) MCG/ACT IN AERS: 2.0000 | INHALATION_SPRAY | Freq: Four times a day (QID) | RESPIRATORY_TRACT | 2 refills | Status: AC | PRN

## 2023-11-15 MED ORDER — ROSUVASTATIN CALCIUM 40 MG PO TABS: 40.0000 mg | ORAL_TABLET | Freq: Every day | ORAL | 3 refills | Status: DC

## 2023-11-15 MED ORDER — VENLAFAXINE HCL ER 150 MG PO CP24: 150.0000 mg | ORAL_CAPSULE | Freq: Every day | ORAL | 3 refills | Status: DC

## 2023-11-15 MED ORDER — LEVOTHYROXINE SODIUM 50 MCG PO TABS: ORAL_TABLET | ORAL | 3 refills | Status: DC

## 2023-11-15 NOTE — Progress Notes (Signed)
Patient ID: Elizabeth Buckley, female   DOB: [birth year], 72 y.o.   MRN: [id number]        Chief Complaint: follow up HTN, HLD and hyperglycemia , low K, ckd3a       HPI:  Elizabeth Buckley is a 72 y.o. female here overall doing ok, Pt denies chest pain, increased sob or doe, wheezing, orthopnea, PND, increased LE swelling, palpitations, dizziness or syncope.   Pt denies polydipsia, polyuria, or new focal neuro s/s.    Pt denies fever, wt loss, night sweats, loss of appetite, or other constitutional symptoms  Due for flu shot  Denies hyper or hypo thyroid symptoms such as voice, skin or hair change.  Does have several wks ongoing nasal allergy symptoms with clearish congestion, itch and sneezing, without fever, pain, ST, cough, swelling or wheezing. Wt Readings from Last 3 Encounters:  11/15/23 203 lb (92.1 kg)  08/15/23 205 lb (93 kg)  05/08/23 208 lb (94.3 kg)   BP Readings from Last 3 Encounters:  11/15/23 132/84  08/15/23 130/81  05/08/23 126/78         Past Medical History:  Diagnosis Date   Anemia    ANXIETY    BACK PAIN    Cataract    CONSTIPATION, CHRONIC    DEPRESSION    GERD (gastroesophageal reflux disease)    HYPERLIPIDEMIA    HYPERTENSION    NASH (NON-ALCOHOLIC STEATOHEPATITIS)    Obesity, unspecified    OSTEOPENIA    Pneumonia    x3   Past Surgical History:  Procedure Laterality Date   ABDOMINAL HYSTERECTOMY     APPENDECTOMY     BLADDER SUSPENSION  01/18/2012   Procedure: TRANSVAGINAL TAPE (TVT) PROCEDURE;  Surgeon: Loney Laurence, MD;  Location: [facility];  Service: Gynecology;  Laterality: N/A;   BREAST BIOPSY Right    CATARACT EXTRACTION     both eyes   cataract surgery     left and right eye   CHOLECYSTECTOMY     COLONOSCOPY W/ BIOPSIES AND POLYPECTOMY  01/03/2003   submucosal lesion   CYSTOCELE REPAIR  01/18/2012   Procedure: ANTERIOR REPAIR (CYSTOCELE);  Surgeon: Loney Laurence, MD;  Location: [facility];  Service: Gynecology;  Laterality: N/A;    CYSTOSCOPY  01/18/2012   Procedure: CYSTOSCOPY;  Surgeon: Loney Laurence, MD;  Location: [facility];  Service: Gynecology;  Laterality: N/A;   ELECTROCARDIOGRAM  07/27/2006   s/p liver biopsy     Dr. Leone Payor   s/p lumbar surgery     trigger finger repair left thumb     TUBAL LIGATION     UPPER GASTROINTESTINAL ENDOSCOPY  01/03/2003    reports that she quit smoking about 22 years ago. Her smoking use included cigarettes. She has never used smokeless tobacco. She reports current alcohol use. She reports that she does not use drugs. family history includes Alcohol abuse in an other family member; Heart disease in her father; Hyperlipidemia in an other family member; Hypertension in an other family member. Allergies  Allergen Reactions   Codeine Nausea Only   Current Outpatient Medications on File Prior to Visit  Medication Sig Dispense Refill   ALPRAZolam (XANAX) 0.25 MG tablet Take 1 tablet (0.25 mg total) by mouth 2 (two) times daily as needed. for anxiety 60 tablet 2   aspirin 81 MG tablet Take 81 mg by mouth daily.     Cholecalciferol (VITAMIN D3) 1000 UNITS CAPS Take 5,000 Units by mouth daily.  ibuprofen (ADVIL,MOTRIN) 200 MG tablet Take 400 mg by mouth daily.     meclizine (ANTIVERT) 12.5 MG tablet Take 1 tablet (12.5 mg total) by mouth 3 (three) times daily as needed. 30 tablet 2   polyethylene glycol (MIRALAX / GLYCOLAX) packet Take 17 g by mouth daily as needed. For constipation 14 each 6   traMADol (ULTRAM) 50 MG tablet Take 1 tablet (50 mg total) by mouth every 6 (six) hours as needed. 30 tablet 2   triamcinolone (NASACORT) 55 MCG/ACT AERO nasal inhaler Place 2 sprays into the nose daily. 1 each 12   No current facility-administered medications on file prior to visit.        ROS:  All others reviewed and negative.  Objective        PE:  BP 132/84 (BP Location: Right Arm, Patient Position: Sitting, Cuff Size: Normal)   Pulse 60   Temp 98.1 F (36.7 C) (Oral)   Ht 5\' 3"   (1.6 m)   Wt 203 lb (92.1 kg)   SpO2 96%   BMI 35.96 kg/m                 Constitutional: Pt appears in NAD               HENT: Head: NCAT.                Right Ear: External ear normal.                 Left Ear: External ear normal.                Eyes: . Pupils are equal, round, and reactive to light. Conjunctivae and EOM are normal               Nose: without d/c or deformity               Neck: Neck supple. Gross normal ROM               Cardiovascular: Normal rate and regular rhythm.                 Pulmonary/Chest: Effort normal and breath sounds without rales or wheezing.                Abd:  Soft, NT, ND, + BS, no organomegaly               Neurological: Pt is alert. At baseline orientation, motor grossly intact               Skin: Skin is warm. No rashes, no other new lesions, LE edema - none               Psychiatric: Pt behavior is normal without agitation   Micro: none  Cardiac tracings I have personally interpreted today:  none  Pertinent Radiological findings (summarize): none   Lab Results  Component Value Date   WBC 8.1 05/08/2023   HGB 14.8 05/08/2023   HCT 43.6 05/08/2023   PLT 263.0 05/08/2023   GLUCOSE 85 11/15/2023   CHOL 300 (H) 11/15/2023   TRIG (H) 11/15/2023    404.0 Triglyceride is over 400; calculations on Lipids are invalid.   HDL 43.80 11/15/2023   LDLDIRECT 200.0 11/15/2023   LDLCALC 63 04/07/2021   ALT 30 11/15/2023   AST 30 11/15/2023   NA 141 11/15/2023   K 3.6 11/15/2023   CL 107 11/15/2023   CREATININE 1.08 11/15/2023  BUN 18 11/15/2023   CO2 27 11/15/2023   TSH 5.61 (H) 05/08/2023   INR 1.0 11/10/2008   HGBA1C 5.1 11/15/2023   MICROALBUR 7.2 (H) 05/08/2023   Assessment/Plan:  Elizabeth Buckley is a 72 y.o. White or Caucasian [1] female with  has a past medical history of Anemia, ANXIETY, BACK PAIN, Cataract, CONSTIPATION, CHRONIC, DEPRESSION, GERD (gastroesophageal reflux disease), HYPERLIPIDEMIA, HYPERTENSION, NASH  (NON-ALCOHOLIC STEATOHEPATITIS), Obesity, unspecified, OSTEOPENIA, and Pneumonia.  Hypokalemia Lab Results  Component Value Date   K 3.6 11/15/2023    Improved, cont currrent med tx  Hypothyroidism Lab Results  Component Value Date   TSH 5.61 (H) 05/08/2023   uncontrolled, pt to continue levothyroxine 50 mcg every day, delcines other change for now   Hyperlipidemia Lab Results  Component Value Date   LDLCALC 63 04/07/2021   Stable, pt to continue current statin crestor 40 qd   Hyperglycemia Lab Results  Component Value Date   HGBA1C 5.1 11/15/2023   Stable, pt to continue current medical treatment - diet, wt control   Essential hypertension BP Readings from Last 3 Encounters:  11/15/23 132/84  08/15/23 130/81  05/08/23 126/78   Stable, pt to continue medical treatment losartan 50 qqd, toprol xl 25 qd   CKD (chronic kidney disease) Lab Results  Component Value Date   CREATININE 1.08 11/15/2023   Stable overall, cont to avoid nephrotoxins   Allergic rhinitis Mild to mod, for otc nasacort asd,  to f/u any worsening symptoms or concerns Followup: Return in about 6 months (around 05/14/2024).  Oliver Barre, MD 11/18/2023 1:36 PM [facility]

## 2023-11-15 NOTE — Patient Instructions (Addendum)
You had the flu shot today  Please continue all other medications as before, and refills have been done if requested.  Please have the pharmacy call with any other refills you may need.  Please keep your appointments with your specialists as you may have planned  Please go to the LAB at the blood drawing area for the tests to be done  You will be contacted by phone if any changes need to be made immediately.  Otherwise, you will receive a letter about your results with an explanation, but please check with MyChart first.

## 2023-11-18 ENCOUNTER — Encounter: Payer: Self-pay | Admitting: Internal Medicine

## 2023-11-18 NOTE — Assessment & Plan Note (Signed)
Lab Results  Component Value Date   HGBA1C 5.1 11/15/2023   Stable, pt to continue current medical treatment - diet, wt control

## 2023-11-18 NOTE — Assessment & Plan Note (Signed)
Lab Results  Component Value Date   CREATININE 1.08 11/15/2023   Stable overall, cont to avoid nephrotoxins

## 2023-11-18 NOTE — Assessment & Plan Note (Signed)
Lab Results  Component Value Date   LDLCALC 63 04/07/2021   Stable, pt to continue current statin crestor 40 qd

## 2023-11-18 NOTE — Assessment & Plan Note (Signed)
BP Readings from Last 3 Encounters:  11/15/23 132/84  08/15/23 130/81  05/08/23 126/78   Stable, pt to continue medical treatment losartan 50 qqd, toprol xl 25 qd

## 2023-11-18 NOTE — Assessment & Plan Note (Signed)
Mild to mod, for otc nasacort asd, to f/u any worsening symptoms or concerns ?

## 2023-11-18 NOTE — Assessment & Plan Note (Signed)
Lab Results  Component Value Date   TSH 5.61 (H) 05/08/2023   uncontrolled, pt to continue levothyroxine 50 mcg every day, delcines other change for now

## 2023-11-18 NOTE — Assessment & Plan Note (Signed)
Lab Results  Component Value Date   K 3.6 11/15/2023    Improved, cont currrent med tx

## 2023-12-08 ENCOUNTER — Ambulatory Visit (INDEPENDENT_AMBULATORY_CARE_PROVIDER_SITE_OTHER): Payer: Medicare Other

## 2023-12-08 DIAGNOSIS — Z0000 Encounter for general adult medical examination without abnormal findings: Secondary | ICD-10-CM | POA: Diagnosis not present

## 2023-12-08 NOTE — Progress Notes (Signed)
Subjective:   Elizabeth Buckley is a 72 y.o. female who presents for Medicare Annual (Subsequent) preventive examination.  Visit Complete: Virtual I connected with  Elizabeth Buckley on 12/08/23 by a audio enabled telemedicine application and verified that I am speaking with the correct person using two identifiers.  Patient Location: Home  Provider Location: Office/Clinic  I discussed the limitations of evaluation and management by telemedicine. The patient expressed understanding and agreed to proceed.  Vital Signs: Because this visit was a virtual/telehealth visit, some criteria may be missing or patient reported. Any vitals not documented were not able to be obtained and vitals that have been documented are patient reported.    Cardiac Risk Factors include: advanced age (>51men, >63 women);dyslipidemia;hypertension     Objective:    Today's Vitals   12/08/23 1426  PainSc: 3    There is no height or weight on file to calculate BMI.     12/08/2023    2:43 PM 10/26/2022    3:35 PM 01/09/2012   10:52 AM  Advanced Directives  Does Patient Have a Medical Advance Directive? No No Patient does not have advance directive  Would patient like information on creating a medical advance directive?  No - Patient declined     Current Medications (verified) Outpatient Encounter Medications as of 12/08/2023  Medication Sig   albuterol (VENTOLIN HFA) 108 (90 Base) MCG/ACT inhaler Inhale 2 puffs into the lungs every 6 (six) hours as needed for wheezing or shortness of breath.   ALPRAZolam (XANAX) 0.25 MG tablet Take 1 tablet (0.25 mg total) by mouth 2 (two) times daily as needed. for anxiety   Cholecalciferol (VITAMIN D3) 1000 UNITS CAPS Take 5,000 Units by mouth daily.   ibuprofen (ADVIL,MOTRIN) 200 MG tablet Take 400 mg by mouth daily.   levothyroxine (SYNTHROID) 50 MCG tablet TAKE 1 TABLET BY MOUTH ONCE DAILY   losartan (COZAAR) 50 MG tablet Take 1 tablet (50 mg total) by mouth daily.    meclizine (ANTIVERT) 12.5 MG tablet Take 1 tablet (12.5 mg total) by mouth 3 (three) times daily as needed.   metoprolol succinate (TOPROL XL) 25 MG 24 hr tablet Take 1 tablet (25 mg total) by mouth daily.   polyethylene glycol (MIRALAX / GLYCOLAX) packet Take 17 g by mouth daily as needed. For constipation   potassium chloride (KLOR-CON 10) 10 MEQ tablet Take 3 tablets (30 mEq total) by mouth daily.   traMADol (ULTRAM) 50 MG tablet Take 1 tablet (50 mg total) by mouth every 6 (six) hours as needed.   venlafaxine XR (EFFEXOR-XR) 150 MG 24 hr capsule Take 1 capsule (150 mg total) by mouth daily with breakfast.   aspirin 81 MG tablet Take 81 mg by mouth daily. (Patient not taking: Reported on 12/08/2023)   rosuvastatin (CRESTOR) 40 MG tablet Take 1 tablet (40 mg total) by mouth daily. (Patient not taking: Reported on 12/08/2023)   triamcinolone (NASACORT) 55 MCG/ACT AERO nasal inhaler Place 2 sprays into the nose daily. (Patient not taking: Reported on 12/08/2023)   No facility-administered encounter medications on file as of 12/08/2023.    Allergies (verified) Codeine   History: Past Medical History:  Diagnosis Date   Anemia    ANXIETY    BACK PAIN    Cataract    CONSTIPATION, CHRONIC    DEPRESSION    GERD (gastroesophageal reflux disease)    HYPERLIPIDEMIA    HYPERTENSION    NASH (NON-ALCOHOLIC STEATOHEPATITIS)    Obesity, unspecified  OSTEOPENIA    Pneumonia    x3   Past Surgical History:  Procedure Laterality Date   ABDOMINAL HYSTERECTOMY     APPENDECTOMY     BLADDER SUSPENSION  01/18/2012   Procedure: TRANSVAGINAL TAPE (TVT) PROCEDURE;  Surgeon: Loney Laurence, MD;  Location: [facility];  Service: Gynecology;  Laterality: N/A;   BREAST BIOPSY Right    CATARACT EXTRACTION     both eyes   cataract surgery     left and right eye   CHOLECYSTECTOMY     COLONOSCOPY W/ BIOPSIES AND POLYPECTOMY  01/03/2003   submucosal lesion   CYSTOCELE REPAIR  01/18/2012   Procedure:  ANTERIOR REPAIR (CYSTOCELE);  Surgeon: Loney Laurence, MD;  Location: [facility];  Service: Gynecology;  Laterality: N/A;   CYSTOSCOPY  01/18/2012   Procedure: CYSTOSCOPY;  Surgeon: Loney Laurence, MD;  Location: [facility];  Service: Gynecology;  Laterality: N/A;   ELECTROCARDIOGRAM  07/27/2006   s/p liver biopsy     Dr. Leone Payor   s/p lumbar surgery     trigger finger repair left thumb     TUBAL LIGATION     UPPER GASTROINTESTINAL ENDOSCOPY  01/03/2003   Family History  Problem Relation Age of Onset   Heart disease Father    Alcohol abuse Other    Hyperlipidemia Other    Hypertension Other    Colon polyps Neg Hx    Esophageal cancer Neg Hx    Stomach cancer Neg Hx    Rectal cancer Neg Hx    Social History   Socioeconomic History   Marital status: Married    Spouse name: Not on file   Number of children: 1   Years of education: Not on file   Highest education level: Not on file  Occupational History   Occupation: disabled Systems developer  Tobacco Use   Smoking status: Former    Current packs/day: 0.00    Types: Cigarettes    Quit date: 01/08/2001    Years since quitting: 22.9   Smokeless tobacco: Never  Vaping Use   Vaping status: Never Used  Substance and Sexual Activity   Alcohol use: Not Currently    Comment: occasion 3-4 x yr   Drug use: No   Sexual activity: Not on file  Other Topics Concern   Not on file  Social History Narrative   Daily caffeine, 2 cups daily   Patient does not get regular exercise   Social Drivers of Health   Financial Resource Strain: Low Risk  (12/08/2023)   Overall Financial Resource Strain (CARDIA)    Difficulty of Paying Living Expenses: Not hard at all  Food Insecurity: No Food Insecurity (12/08/2023)   Hunger Vital Sign    Worried About Running Out of Food in the Last Year: Never true    Ran Out of Food in the Last Year: Never true  Transportation Needs: No Transportation Needs (12/08/2023)   PRAPARE - Scientist, research (physical sciences) (Medical): No    Lack of Transportation (Non-Medical): No  Physical Activity: Inactive (12/08/2023)   Exercise Vital Sign    Days of Exercise per Week: 0 days    Minutes of Exercise per Session: 0 min  Stress: No Stress Concern Present (12/08/2023)   [employer] of Occupational Health - Occupational Stress Questionnaire    Feeling of Stress : Only a little  Social Connections: Unknown (12/08/2023)   Social Connection and Isolation Panel [NHANES]    Frequency of Communication with  Friends and Family: Twice a week    Frequency of Social Gatherings with Friends and Family: Not on file    Attends Religious Services: Never    Database administrator or Organizations: No    Attends Engineer, structural: Never    Marital Status: Married    Tobacco Counseling Counseling given: Not Answered   Clinical Intake:  Pre-visit preparation completed: Yes  Pain : 0-10 Pain Score: 3  Pain Type: Chronic pain Pain Location: Back Pain Orientation: Lower Pain Descriptors / Indicators: Aching Pain Onset: More than a month ago Pain Frequency: Constant     Nutritional Risks: None Diabetes: No  How often do you need to have someone help you when you read instructions, pamphlets, or other written materials from your doctor or pharmacy?: 1 - Never  Interpreter Needed?: No  Information entered by :: NAllen LPN   Activities of Daily Living    12/08/2023    2:28 PM  In your present state of health, do you have any difficulty performing the following activities:  Hearing? 0  Vision? 0  Difficulty concentrating or making decisions? 1  Comment trouble with names  Walking or climbing stairs? 1  Comment due to back  Dressing or bathing? 0  Doing errands, shopping? 0  Preparing Food and eating ? N  Using the Toilet? N  In the past six months, have you accidently leaked urine? Y  Do you have problems with loss of bowel control? Y  Comment ocassionally  Managing  your Medications? N  Managing your Finances? N  Housekeeping or managing your Housekeeping? N    Patient Care Team: Corwin Levins, MD as PCP - General  Indicate any recent Medical Services you may have received from other than [facility] providers in the past year (date may be approximate).     Assessment:   This is a routine wellness examination for Elizabeth Buckley.  Hearing/Vision screen Hearing Screening - Comments:: Denies hearing issues Vision Screening - Comments:: Regular eye exams,  Opth   Goals Addressed             This Visit's Progress    Patient Stated       12/08/2023, wants to lose weight       Depression Screen    12/08/2023    2:46 PM 11/15/2023    2:18 PM 05/08/2023    2:06 PM 10/26/2022    3:39 PM 10/25/2022    4:29 PM 10/25/2022    3:54 PM 04/21/2022    1:48 PM  PHQ 2/9 Scores  PHQ - 2 Score 0 0 0 1 1 0 1  PHQ- 9 Score   2 1  0     Fall Risk    12/08/2023    2:45 PM 11/15/2023    2:19 PM 11/15/2023    2:18 PM 05/08/2023    2:06 PM 10/26/2022    3:36 PM  Fall Risk   Falls in the past year? 1 0 0 0 0  Comment gets dizzy      Number falls in past yr: 1 0 0 0 0  Injury with Fall? 0 0 0 0 0  Risk for fall due to : Medication side effect;Impaired mobility;Impaired balance/gait No Fall Risks No Fall Risks No Fall Risks No Fall Risks  Follow up Falls prevention discussed;Falls evaluation completed Falls evaluation completed Falls evaluation completed Falls evaluation completed Falls prevention discussed    MEDICARE RISK AT HOME: Medicare Risk at Home Any  stairs in or around the home?: Yes If so, are there any without handrails?: No Home free of loose throw rugs in walkways, pet beds, electrical cords, etc?: Yes Adequate lighting in your home to reduce risk of falls?: Yes Life alert?: No Use of a cane, walker or w/c?: No Grab bars in the bathroom?: Yes Shower chair or bench in shower?: No Elevated toilet seat or a handicapped toilet?: Yes  TIMED  UP AND GO:  Was the test performed?  No    Cognitive Function:    08/15/2023   11:53 AM  MMSE - Mini Mental State Exam  Orientation to time 5  Orientation to Place 5  Registration 3  Attention/ Calculation 5  Recall 3  Language- name 2 objects 2  Language- repeat 1  Language- follow 3 step command 2  Language- read & follow direction 1  Write a sentence 1  Copy design 0  Total score 28        12/08/2023    2:46 PM 10/26/2022    3:36 PM  6CIT Screen  What Year? 0 points 0 points  What month? 0 points 0 points  What time? 0 points 0 points  Count back from 20 0 points 0 points  Months in reverse 2 points 0 points  Repeat phrase 8 points 0 points  Total Score 10 points 0 points    Immunizations Immunization History  Administered Date(s) Administered   Fluad Quad(high Dose 65+) 10/15/2021, 10/25/2022   Fluad Trivalent(High Dose 65+) 11/15/2023   H1N1 11/24/2008   Influenza Split 11/03/2011, 11/18/2012   Influenza Whole 09/21/2010   Influenza, High Dose Seasonal PF 08/13/2015, 09/27/2017, 02/05/2019   Influenza,inj,Quad PF,6+ Mos 01/22/2014, 08/07/2014   PFIZER(Purple Top)SARS-COV-2 Vaccination 02/20/2020, 03/18/2020, 09/21/2020, 01/19/2021   Pneumococcal Conjugate-13 08/27/2015   Pneumococcal Polysaccharide-23 08/12/2016   Td 09/21/2010   Tdap 04/07/2021   Zoster Recombinant(Shingrix) 01/25/2021, 04/14/2021    TDAP status: Up to date  Flu Vaccine status: Up to date  Pneumococcal vaccine status: Up to date  Covid-19 vaccine status: Information provided on how to obtain vaccines.   Qualifies for Shingles Vaccine? Yes   Zostavax completed Yes   Shingrix Completed?: Yes  Screening Tests Health Maintenance  Topic Date Due   Medicare Annual Wellness (AWV)  12/07/2024   MAMMOGRAM  05/16/2025   DTaP/Tdap/Td (3 - Td or Tdap) 04/08/2031   Pneumonia Vaccine 1+ Years old  Completed   INFLUENZA VACCINE  Completed   DEXA SCAN  Completed   Hepatitis C Screening   Completed   Zoster Vaccines- Shingrix  Completed   HPV VACCINES  Aged Out   Colonoscopy  Discontinued   COVID-19 Vaccine  Discontinued    Health Maintenance  There are no preventive care reminders to display for this patient.  Colorectal cancer screening: No longer required.   Mammogram status: Completed 05/17/2023. Repeat every year  Bone Density status: Completed 08/13/2015.   Lung Cancer Screening: (Low Dose CT Chest recommended if Age 81-80 years, 20 pack-year currently smoking OR have quit w/in 15years.) does not qualify.   Lung Cancer Screening Referral: no  Additional Screening:  Hepatitis C Screening: does qualify; Completed 04/22/2008  Vision Screening: Recommended annual ophthalmology exams for early detection of glaucoma and other disorders of the eye. Is the patient up to date with their annual eye exam?  Yes  Who is the provider or what is the name of the office in which the patient attends annual eye exams? [facility] If pt  is not established with a provider, would they like to be referred to a provider to establish care? No .   Dental Screening: Recommended annual dental exams for proper oral hygiene  Diabetic Foot Exam: n/a  Community Resource Referral / Chronic Care Management: CRR required this visit?  No   CCM required this visit?  No     Plan:     I have personally reviewed and noted the following in the patient's chart:   Medical and social history Use of alcohol, tobacco or illicit drugs  Current medications and supplements including opioid prescriptions. Patient is not currently taking opioid prescriptions. Functional ability and status Nutritional status Physical activity Advanced directives List of other physicians Hospitalizations, surgeries, and ER visits in previous 12 months Vitals Screenings to include cognitive, depression, and falls Referrals and appointments  In addition, I have reviewed and discussed with patient certain  preventive protocols, quality metrics, and best practice recommendations. A written personalized care plan for preventive services as well as general preventive health recommendations were provided to patient.     Barb Merino, LPN   09/81/1914   After Visit Summary: (MyChart) Due to this being a telephonic visit, the after visit summary with patients personalized plan was offered to patient via MyChart   Nurse Notes: none

## 2023-12-08 NOTE — Patient Instructions (Signed)
Elizabeth Buckley , Thank you for taking time to come for your Medicare Wellness Visit. I appreciate your ongoing commitment to your health goals. Please review the following plan we discussed and let me know if I can assist you in the future.   Referrals/Orders/Follow-Ups/Clinician Recommendations: none  This is a list of the screening recommended for you and due dates:  Health Maintenance  Topic Date Due   Medicare Annual Wellness Visit  12/07/2024   Mammogram  05/16/2025   DTaP/Tdap/Td vaccine (3 - Td or Tdap) 04/08/2031   Pneumonia Vaccine  Completed   Flu Shot  Completed   DEXA scan (bone density measurement)  Completed   Hepatitis C Screening  Completed   Zoster (Shingles) Vaccine  Completed   HPV Vaccine  Aged Out   Colon Cancer Screening  Discontinued   COVID-19 Vaccine  Discontinued    Advanced directives: (ACP Link)Information on Advanced Care Planning can be found at [facility] Advance Health Care Directives Advance Health Care Directives ([email or web address]   Next Medicare Annual Wellness Visit scheduled for next year: Yes  Insert Preventive Care attachment Insert FALL PREVENTION attachment if needed

## 2024-04-18 ENCOUNTER — Other Ambulatory Visit: Payer: Self-pay | Admitting: Internal Medicine

## 2024-04-18 DIAGNOSIS — Z1231 Encounter for screening mammogram for malignant neoplasm of breast: Secondary | ICD-10-CM

## 2024-04-18 NOTE — Telephone Encounter (Signed)
 Copied from CRM [id number]. Topic: Clinical - Medication Refill >> Apr 18, 2024  2:47 PM Howard Macho wrote: Most Recent Primary Care Visit:  Provider: ALLEN, NICKEAH E  Department: [facility] [place]  Visit Type: MEDICARE AWV, SEQUENTIAL  Date: 12/08/2023  Medication: venlafaxine  XR (EFFEXOR -XR) 150 MG 24 hr capsule  Has the patient contacted their pharmacy? Yes (Agent: If no, request that the patient contact the pharmacy for the refill. If patient does not wish to contact the pharmacy document the reason why and proceed with request.) (Agent: If yes, when and what did the pharmacy advise?)  Is this the correct pharmacy for this prescription? Yes If no, delete pharmacy and type the correct one.  This is the patient's preferred pharmacy:  [id number] - [place], [place] - [address] [place] [address] Phone: [phone] Fax: [phone]   Has the prescription been filled recently? No  Is the patient out of the medication? Yes  Has the patient been seen for an appointment in the last year OR does the patient have an upcoming appointment? Yes  Can we respond through MyChart? Yes  Agent: Please be advised that Rx refills may take up to 3 business days. We ask that you follow-up with your pharmacy.

## 2024-04-19 MED ORDER — VENLAFAXINE HCL ER 150 MG PO CP24: 150.0000 mg | ORAL_CAPSULE | Freq: Every day | ORAL | 3 refills | Status: DC

## 2024-05-14 ENCOUNTER — Ambulatory Visit: Payer: Medicare Other | Admitting: Internal Medicine

## 2024-05-17 ENCOUNTER — Encounter (HOSPITAL_BASED_OUTPATIENT_CLINIC_OR_DEPARTMENT_OTHER): Payer: Self-pay

## 2024-05-17 ENCOUNTER — Ambulatory Visit
Admission: RE | Admit: 2024-05-17 | Discharge: 2024-05-17 | Disposition: A | Discharge status: Home / Self Care | Source: Ambulatory Visit | Attending: Internal Medicine | Admitting: Internal Medicine

## 2024-05-17 ENCOUNTER — Emergency Department (HOSPITAL_BASED_OUTPATIENT_CLINIC_OR_DEPARTMENT_OTHER)
Admission: EM | Admit: 2024-05-17 | Discharge: 2024-05-17 | Disposition: A | Discharge status: Home / Self Care | Attending: Emergency Medicine | Admitting: Emergency Medicine

## 2024-05-17 ENCOUNTER — Other Ambulatory Visit: Payer: Self-pay

## 2024-05-17 ENCOUNTER — Emergency Department (HOSPITAL_BASED_OUTPATIENT_CLINIC_OR_DEPARTMENT_OTHER)

## 2024-05-17 ENCOUNTER — Emergency Department (HOSPITAL_BASED_OUTPATIENT_CLINIC_OR_DEPARTMENT_OTHER): Admitting: Radiology

## 2024-05-17 DIAGNOSIS — Z7982 Long term (current) use of aspirin: Secondary | ICD-10-CM | POA: Insufficient documentation

## 2024-05-17 DIAGNOSIS — R0789 Other chest pain: Secondary | ICD-10-CM | POA: Diagnosis not present

## 2024-05-17 DIAGNOSIS — M549 Dorsalgia, unspecified: Secondary | ICD-10-CM | POA: Diagnosis not present

## 2024-05-17 DIAGNOSIS — I6782 Cerebral ischemia: Secondary | ICD-10-CM | POA: Diagnosis not present

## 2024-05-17 DIAGNOSIS — I10 Essential (primary) hypertension: Secondary | ICD-10-CM | POA: Diagnosis not present

## 2024-05-17 DIAGNOSIS — S0990XA Unspecified injury of head, initial encounter: Secondary | ICD-10-CM | POA: Diagnosis not present

## 2024-05-17 DIAGNOSIS — Y92410 Unspecified street and highway as the place of occurrence of the external cause: Secondary | ICD-10-CM | POA: Diagnosis not present

## 2024-05-17 DIAGNOSIS — R079 Chest pain, unspecified: Secondary | ICD-10-CM | POA: Diagnosis not present

## 2024-05-17 DIAGNOSIS — Z1231 Encounter for screening mammogram for malignant neoplasm of breast: Secondary | ICD-10-CM

## 2024-05-17 DIAGNOSIS — S199XXA Unspecified injury of neck, initial encounter: Secondary | ICD-10-CM | POA: Diagnosis not present

## 2024-05-17 DIAGNOSIS — R9089 Other abnormal findings on diagnostic imaging of central nervous system: Secondary | ICD-10-CM | POA: Diagnosis not present

## 2024-05-17 LAB — CBC
HCT: 44.2 % (ref 36.0–46.0)
Hemoglobin: 14.9 g/dL (ref 12.0–15.0)
MCH: 31.3 pg (ref 26.0–34.0)
MCHC: 33.7 g/dL (ref 30.0–36.0)
MCV: 92.9 fL (ref 80.0–100.0)
Platelets: 182 10*3/uL (ref 150–400)
RBC: 4.76 MIL/uL (ref 3.87–5.11)
RDW: 13.6 % (ref 11.5–15.5)
WBC: 6.6 10*3/uL (ref 4.0–10.5)
nRBC: 0 % (ref 0.0–0.2)
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
## 2024-05-21 ENCOUNTER — Ambulatory Visit: Admitting: Internal Medicine

## 2024-06-04 ENCOUNTER — Ambulatory Visit: Admitting: Internal Medicine

## 2024-06-06 ENCOUNTER — Encounter: Payer: Self-pay | Admitting: Internal Medicine

## 2024-06-06 ENCOUNTER — Ambulatory Visit (INDEPENDENT_AMBULATORY_CARE_PROVIDER_SITE_OTHER): Admitting: Internal Medicine

## 2024-06-06 ENCOUNTER — Telehealth: Payer: Self-pay

## 2024-06-06 VITALS — BP 128/72 | HR 85 | Temp 98.2°F | Ht 63.0 in | Wt 199.0 lb

## 2024-06-06 DIAGNOSIS — F32A Depression, unspecified: Secondary | ICD-10-CM | POA: Diagnosis not present

## 2024-06-06 DIAGNOSIS — R079 Chest pain, unspecified: Secondary | ICD-10-CM | POA: Diagnosis not present

## 2024-06-06 DIAGNOSIS — E559 Vitamin D deficiency, unspecified: Secondary | ICD-10-CM

## 2024-06-06 DIAGNOSIS — I10 Essential (primary) hypertension: Secondary | ICD-10-CM | POA: Diagnosis not present

## 2024-06-06 DIAGNOSIS — D500 Iron deficiency anemia secondary to blood loss (chronic): Secondary | ICD-10-CM | POA: Diagnosis not present

## 2024-06-06 DIAGNOSIS — E538 Deficiency of other specified B group vitamins: Secondary | ICD-10-CM

## 2024-06-06 DIAGNOSIS — Z0001 Encounter for general adult medical examination with abnormal findings: Secondary | ICD-10-CM

## 2024-06-06 DIAGNOSIS — E785 Hyperlipidemia, unspecified: Secondary | ICD-10-CM

## 2024-06-06 DIAGNOSIS — J439 Emphysema, unspecified: Secondary | ICD-10-CM

## 2024-06-06 DIAGNOSIS — R739 Hyperglycemia, unspecified: Secondary | ICD-10-CM

## 2024-06-06 DIAGNOSIS — E876 Hypokalemia: Secondary | ICD-10-CM

## 2024-06-06 LAB — CBC WITH DIFFERENTIAL/PLATELET
Basophils Absolute: 0 10*3/uL (ref 0.0–0.1)
Basophils Relative: 0.3 % (ref 0.0–3.0)
Eosinophils Absolute: 0.3 10*3/uL (ref 0.0–0.7)
Eosinophils Relative: 4.2 % (ref 0.0–5.0)
HCT: 45.1 % (ref 36.0–46.0)
Hemoglobin: 15.4 g/dL — ABNORMAL HIGH (ref 12.0–15.0)
Lymphocytes Relative: 36.5 % (ref 12.0–46.0)
Lymphs Abs: 2.7 10*3/uL (ref 0.7–4.0)
MCHC: 34.1 g/dL (ref 30.0–36.0)
MCV: 92.3 fl (ref 78.0–100.0)
Monocytes Absolute: 0.5 10*3/uL (ref 0.1–1.0)
Monocytes Relative: 6.6 % (ref 3.0–12.0)
Neutro Abs: 3.9 10*3/uL (ref 1.4–7.7)
Neutrophils Relative %: 52.4 % (ref 43.0–77.0)
Platelets: 251 10*3/uL (ref 150.0–400.0)
RBC: 4.88 Mil/uL (ref 3.87–5.11)
RDW: 14.5 % (ref 11.5–15.5)
WBC: 7.4 10*3/uL (ref 4.0–10.5)

## 2024-06-06 LAB — URINALYSIS, ROUTINE W REFLEX MICROSCOPIC
Bilirubin Urine: NEGATIVE
Hgb urine dipstick: NEGATIVE
Nitrite: NEGATIVE
Specific Gravity, Urine: 1.02 (ref 1.000–1.030)
Urine Glucose: NEGATIVE
Urobilinogen, UA: 2 — AB (ref 0.0–1.0)
pH: 6 (ref 5.0–8.0)

## 2024-06-06 LAB — HEPATIC FUNCTION PANEL
ALT: 26 U/L (ref 0–35)
AST: 36 U/L (ref 0–37)
Albumin: 4.1 g/dL (ref 3.5–5.2)
Alkaline Phosphatase: 131 U/L — ABNORMAL HIGH (ref 39–117)
Bilirubin, Direct: 0.2 mg/dL (ref 0.0–0.3)
Total Bilirubin: 0.6 mg/dL (ref 0.2–1.2)
Total Protein: 7.7 g/dL (ref 6.0–8.3)

## 2024-06-06 LAB — LIPID PANEL
Cholesterol: 272 mg/dL — ABNORMAL HIGH (ref 0–200)
HDL: 45.5 mg/dL (ref >39.00)
LDL Cholesterol: 178 mg/dL — ABNORMAL HIGH (ref 0–99)
NonHDL: 226.29
Total CHOL/HDL Ratio: 6
Triglycerides: 243 mg/dL — ABNORMAL HIGH (ref 0.0–149.0)
VLDL: 48.6 mg/dL — ABNORMAL HIGH (ref 0.0–40.0)

## 2024-06-06 LAB — BASIC METABOLIC PANEL WITH GFR
BUN: 12 mg/dL (ref 6–23)
CO2: 29 meq/L (ref 19–32)
Calcium: 10.3 mg/dL (ref 8.4–10.5)
Chloride: 104 meq/L (ref 96–112)
Creatinine, Ser: 0.98 mg/dL (ref 0.40–1.20)
GFR: 57.09 mL/min — ABNORMAL LOW (ref >60.00)
Glucose, Bld: 110 mg/dL — ABNORMAL HIGH (ref 70–99)
Potassium: 3.6 meq/L (ref 3.5–5.1)
Sodium: 139 meq/L (ref 135–145)

## 2024-06-06 LAB — VITAMIN B12: Vitamin B-12: >1500 pg/mL — ABNORMAL HIGH (ref 211–911)

## 2024-06-06 LAB — HEMOGLOBIN A1C: Hgb A1c MFr Bld: 5.2 % (ref 4.6–6.5)

## 2024-06-06 LAB — VITAMIN D 25 HYDROXY (VIT D DEFICIENCY, FRACTURES): VITD: 30.42 ng/mL (ref 30.00–100.00)

## 2024-06-06 LAB — TSH: TSH: 3.69 u[IU]/mL (ref 0.35–5.50)

## 2024-06-06 NOTE — Telephone Encounter (Signed)
 No sorry we try not to go stronger than tramadol  for pain, though I can refer to Pain Management if she wants, such as the local [facility].  thanks

## 2024-06-06 NOTE — Progress Notes (Unsigned)
 Patient ID: Elizabeth Buckley, female   DOB: [birth year], 73 y.o.   MRN: [id number]         Chief Complaint:: wellness exam and Medical Management of Chronic Issues (6 month follow up)  ***       HPI:  Elizabeth Buckley is a 73 y.o. female here for wellness exam                        Also***   Wt Readings from Last 3 Encounters:  06/06/24 199 lb (90.3 kg)  05/17/24 197 lb (89.4 kg)  11/15/23 203 lb (92.1 kg)   BP Readings from Last 3 Encounters:  06/06/24 128/72  05/17/24 (!) 156/93  11/15/23 132/84   Immunization History  Administered Date(s) Administered   Fluad Quad(high Dose 65+) 10/15/2021, 10/25/2022   Fluad Trivalent(High Dose 65+) 11/15/2023   H1N1 11/24/2008   Influenza Split 11/03/2011, 11/18/2012   Influenza Whole 09/21/2010   Influenza, High Dose Seasonal PF 08/13/2015, 09/27/2017, 02/05/2019   Influenza,inj,Quad PF,6+ Mos 01/22/2014, 08/07/2014   PFIZER(Purple Top)SARS-COV-2 Vaccination 02/20/2020, 03/18/2020, 09/21/2020, 01/19/2021   Pneumococcal Conjugate-13 08/27/2015   Pneumococcal Polysaccharide-23 08/12/2016   Td 09/21/2010   Tdap 04/07/2021   Zoster Recombinant(Shingrix) 01/25/2021, 04/14/2021  There are no preventive care reminders to display for this patient.    Past Medical History:  Diagnosis Date   Anemia    ANXIETY    BACK PAIN    Cataract    CONSTIPATION, CHRONIC    DEPRESSION    GERD (gastroesophageal reflux disease)    HYPERLIPIDEMIA    HYPERTENSION    NASH (NON-ALCOHOLIC STEATOHEPATITIS)    Obesity, unspecified    OSTEOPENIA    Pneumonia    x3   Past Surgical History:  Procedure Laterality Date   ABDOMINAL HYSTERECTOMY     APPENDECTOMY     BLADDER SUSPENSION  01/18/2012   Procedure: TRANSVAGINAL TAPE (TVT) PROCEDURE;  Surgeon: Oddis Bench, MD;  Location: [facility];  Service: Gynecology;  Laterality: N/A;   BREAST BIOPSY Right    CATARACT EXTRACTION     both eyes   cataract surgery     left and right eye   CHOLECYSTECTOMY      COLONOSCOPY W/ BIOPSIES AND POLYPECTOMY  01/03/2003   submucosal lesion   CYSTOCELE REPAIR  01/18/2012   Procedure: ANTERIOR REPAIR (CYSTOCELE);  Surgeon: Oddis Bench, MD;  Location: [facility];  Service: Gynecology;  Laterality: N/A;   CYSTOSCOPY  01/18/2012   Procedure: CYSTOSCOPY;  Surgeon: Oddis Bench, MD;  Location: [facility];  Service: Gynecology;  Laterality: N/A;   ELECTROCARDIOGRAM  07/27/2006   s/p liver biopsy     Dr. Willy Harvest   s/p lumbar surgery     trigger finger repair left thumb     TUBAL LIGATION     UPPER GASTROINTESTINAL ENDOSCOPY  01/03/2003    reports that she quit smoking about 23 years ago. Her smoking use included cigarettes. She has never used smokeless tobacco. She reports that she does not currently use alcohol. She reports that she does not use drugs. family history includes Alcohol abuse in an other family member; Heart disease in her father; Hyperlipidemia in an other family member; Hypertension in an other family member. Allergies  Allergen Reactions   Codeine Nausea Only   Current Outpatient Medications on File Prior to Visit  Medication Sig Dispense Refill   albuterol  (VENTOLIN  HFA) 108 (90 Base) MCG/ACT inhaler Inhale 2 puffs into the  lungs every 6 (six) hours as needed for wheezing or shortness of breath. 1 each 2   ALPRAZolam  (XANAX ) 0.25 MG tablet Take 1 tablet (0.25 mg total) by mouth 2 (two) times daily as needed. for anxiety 60 tablet 2   aspirin 81 MG tablet Take 81 mg by mouth daily.     Cholecalciferol (VITAMIN D3) 1000 UNITS CAPS Take 5,000 Units by mouth daily.     ibuprofen (ADVIL,MOTRIN) 200 MG tablet Take 400 mg by mouth daily.     levothyroxine  (SYNTHROID ) 50 MCG tablet TAKE 1 TABLET BY MOUTH ONCE DAILY 90 tablet 3   losartan  (COZAAR ) 50 MG tablet Take 1 tablet (50 mg total) by mouth daily. 90 tablet 3   meclizine  (ANTIVERT ) 12.5 MG tablet Take 1 tablet (12.5 mg total) by mouth 3 (three) times daily as needed. 30 tablet 2   metoprolol   succinate (TOPROL  XL) 25 MG 24 hr tablet Take 1 tablet (25 mg total) by mouth daily. 90 tablet 3   polyethylene glycol (MIRALAX  / GLYCOLAX ) packet Take 17 g by mouth daily as needed. For constipation 14 each 6   potassium chloride  (KLOR-CON  10) 10 MEQ tablet Take 3 tablets (30 mEq total) by mouth daily. 270 tablet 3   rosuvastatin  (CRESTOR ) 40 MG tablet Take 1 tablet (40 mg total) by mouth daily. 90 tablet 3   traMADol  (ULTRAM ) 50 MG tablet Take 1 tablet (50 mg total) by mouth every 6 (six) hours as needed. 30 tablet 2   triamcinolone  (NASACORT ) 55 MCG/ACT AERO nasal inhaler Place 2 sprays into the nose daily. 1 each 12   venlafaxine  XR (EFFEXOR -XR) 150 MG 24 hr capsule Take 1 capsule (150 mg total) by mouth daily with breakfast. 90 capsule 3   No current facility-administered medications on file prior to visit.        ROS:  All others reviewed and negative.  Objective        PE:  BP 128/72 (BP Location: Right Arm, Patient Position: Sitting, Cuff Size: Normal)   Pulse 85   Temp 98.2 F (36.8 C) (Oral)   Ht 5' 3 (1.6 m)   Wt 199 lb (90.3 kg)   SpO2 98%   BMI 35.25 kg/m                 Constitutional: Pt appears in NAD               HENT: Head: NCAT.                Right Ear: External ear normal.                 Left Ear: External ear normal.                Eyes: . Pupils are equal, round, and reactive to light. Conjunctivae and EOM are normal               Nose: without d/c or deformity               Neck: Neck supple. Gross normal ROM               Cardiovascular: Normal rate and regular rhythm.                 Pulmonary/Chest: Effort normal and breath sounds without rales or wheezing.                Abd:  Soft, NT, ND, + BS, no organomegaly  Neurological: Pt is alert. At baseline orientation, motor grossly intact               Skin: Skin is warm. No rashes, no other new lesions, LE edema - ***               Psychiatric: Pt behavior is normal without agitation    Micro: none  Cardiac tracings I have personally interpreted today:  none  Pertinent Radiological findings (summarize): none   Lab Results  Component Value Date   WBC 6.6 05/17/2024   HGB 14.9 05/17/2024   HCT 44.2 05/17/2024   PLT 182 05/17/2024   GLUCOSE 85 11/15/2023   CHOL 300 (H) 11/15/2023   TRIG (H) 11/15/2023    404.0 Triglyceride is over 400; calculations on Lipids are invalid.   HDL 43.80 11/15/2023   LDLDIRECT 200.0 11/15/2023   LDLCALC 63 04/07/2021   ALT 30 11/15/2023   AST 30 11/15/2023   NA 141 11/15/2023   K 3.6 11/15/2023   CL 107 11/15/2023   CREATININE 1.08 11/15/2023   BUN 18 11/15/2023   CO2 27 11/15/2023   TSH 5.61 (H) 05/08/2023   INR 1.0 11/10/2008   HGBA1C 5.1 11/15/2023   MICROALBUR 7.2 (H) 05/08/2023   Assessment/Plan:  Elizabeth Buckley is a 73 y.o. White or Caucasian [1] female with  has a past medical history of Anemia, ANXIETY, BACK PAIN, Cataract, CONSTIPATION, CHRONIC, DEPRESSION, GERD (gastroesophageal reflux disease), HYPERLIPIDEMIA, HYPERTENSION, NASH (NON-ALCOHOLIC STEATOHEPATITIS), Obesity, unspecified, OSTEOPENIA, and Pneumonia.  No problem-specific Assessment & Plan notes found for this encounter.  Followup: No follow-ups on file.  Rosalia Colonel, MD 06/06/2024 1:21 PM [facility]

## 2024-06-06 NOTE — Patient Instructions (Signed)

## 2024-06-07 ENCOUNTER — Ambulatory Visit: Payer: Self-pay | Admitting: Internal Medicine

## 2024-06-09 ENCOUNTER — Encounter: Payer: Self-pay | Admitting: Internal Medicine

## 2024-06-09 DIAGNOSIS — R079 Chest pain, unspecified: Secondary | ICD-10-CM | POA: Insufficient documentation
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
## 2024-06-09 NOTE — Assessment & Plan Note (Signed)
 Noted on recent Ct ,  Pt declines worsening sob, declines need for inhaler prn

## 2024-06-09 NOTE — Assessment & Plan Note (Signed)
 Lab Results  Component Value Date   HGBA1C 5.2 06/06/2024   Stable, pt to continue current medical treatment  - diet, wt control

## 2024-06-09 NOTE — Assessment & Plan Note (Signed)

## 2024-06-09 NOTE — Assessment & Plan Note (Signed)
Stable overall, cont current med tx 

## 2024-06-09 NOTE — Assessment & Plan Note (Signed)
No recent overt bleeding, for f/u lab ?

## 2024-06-09 NOTE — Assessment & Plan Note (Signed)
 Ok to continue the K at 20 meq per day only, for recheck K today

## 2024-06-09 NOTE — Assessment & Plan Note (Signed)
 Lab Results  Component Value Date   LDLCALC 178 (H) 06/06/2024   Severe unocntrolled, pt to restart crestoir 40 qd

## 2024-06-09 NOTE — Assessment & Plan Note (Signed)
 BP Readings from Last 3 Encounters:  06/06/24 128/72  05/17/24 (!) 156/93  11/15/23 132/84   Stable, pt to continue medical treatment losartan  50 every day, toprol  xl 25 qd

## 2024-06-10 NOTE — Telephone Encounter (Signed)
 Called and let Pt know , she states she will hold off on the referral for now if she changes her mind she will call and let us  know.

## 2024-06-29 ENCOUNTER — Other Ambulatory Visit: Payer: Self-pay | Admitting: Internal Medicine

## 2024-10-21 ENCOUNTER — Other Ambulatory Visit: Payer: Self-pay | Admitting: Internal Medicine

## 2024-10-21 NOTE — Telephone Encounter (Unsigned)
 Copied from CRM [id number]. Topic: Clinical - Medication Refill >> Oct 21, 2024 11:18 AM Nessti S wrote: Medication: metoprolol  succinate (TOPROL  XL) 25 MG 24 hr tablet  Has the patient contacted their pharmacy? Yes (Agent: If no, request that the patient contact the pharmacy for the refill. If patient does not wish to contact the pharmacy document the reason why and proceed with request.) (Agent: If yes, when and what did the pharmacy advise?)  This is the patient's preferred pharmacy:  [id number] - [place] - [address] [place] [address] Phone: [phone] Fax: [phone]  Is this the correct pharmacy for this prescription? Yes If no, delete pharmacy and type the correct one.   Has the prescription been filled recently? No  Is the patient out of the medication? Yes  Has the patient been seen for an appointment in the last year OR does the patient have an upcoming appointment? Yes  Can we respond through MyChart? No  Agent: Please be advised that Rx refills may take up to 3 business days. We ask that you follow-up with your pharmacy.

## 2024-10-25 MED ORDER — METOPROLOL SUCCINATE ER 25 MG PO TB24: 25.0000 mg | ORAL_TABLET | Freq: Every day | ORAL | 3 refills | Status: DC

## 2024-12-06 ENCOUNTER — Ambulatory Visit: Admitting: Internal Medicine

## 2024-12-09 ENCOUNTER — Ambulatory Visit: Payer: Medicare Other

## 2024-12-09 VITALS — Ht 63.0 in | Wt 199.0 lb

## 2024-12-09 DIAGNOSIS — Z0000 Encounter for general adult medical examination without abnormal findings: Secondary | ICD-10-CM | POA: Diagnosis not present

## 2024-12-09 NOTE — Patient Instructions (Addendum)
 Ms. Pardue,  Thank you for taking the time for your Medicare Wellness Visit. I appreciate your continued commitment to your health goals. Please review the care plan we discussed, and feel free to reach out if I can assist you further.  Please note that Annual Wellness Visits do not include a physical exam. Some assessments may be limited, especially if the visit was conducted virtually. If needed, we may recommend an in-person follow-up with your provider.  Ongoing Care Seeing your primary care provider every 3 to 6 months helps us  monitor your health and provide consistent, personalized care.   Referrals If a referral was made during today's visit and you haven't received any updates within two weeks, please contact the referred provider directly to check on the status.  Recommended Screenings:  Health Maintenance  Topic Date Due   Flu Shot  07/19/2024   Medicare Annual Wellness Visit  12/09/2025   Breast Cancer Screening  05/17/2026   DTaP/Tdap/Td vaccine (3 - Td or Tdap) 04/08/2031   Pneumococcal Vaccine for age over 62  Completed   Osteoporosis screening with Bone Density Scan  Completed   Hepatitis C Screening  Completed   Zoster (Shingles) Vaccine  Completed   Meningitis B Vaccine  Aged Out   Colon Cancer Screening  Discontinued   COVID-19 Vaccine  Discontinued       12/08/2023    2:43 PM  Advanced Directives  Does Patient Have a Medical Advance Directive? No    Vision: Annual vision screenings are recommended for early detection of glaucoma, cataracts, and diabetic retinopathy. These exams can also reveal signs of chronic conditions such as diabetes and high blood pressure.  Dental: Annual dental screenings help detect early signs of oral cancer, gum disease, and other conditions linked to overall health, including heart disease and diabetes.

## 2024-12-09 NOTE — Progress Notes (Signed)
 "  Chief Complaint  Patient presents with   Medicare Wellness     Subjective:   Elizabeth Buckley is a 73 y.o. female who presents for a Medicare Annual Wellness Visit.  Visit info / Clinical Intake: Medicare Wellness Visit Type:: Subsequent Annual Wellness Visit Persons participating in visit and providing information:: patient Medicare Wellness Visit Mode:: Telephone If telephone:: video declined Since this visit was completed virtually, some vitals may be partially provided or unavailable. Missing vitals are due to the limitations of the virtual format.: Documented vitals are patient reported If Telephone or Video please confirm:: I connected with patient using audio/video enable telemedicine. I verified patient identity with two identifiers, discussed telehealth limitations, and patient agreed to proceed. Patient Location:: Home Provider Location:: Office Interpreter Needed?: No Pre-visit prep was completed: yes AWV questionnaire completed by patient prior to visit?: no Living arrangements:: lives with spouse/significant other Patient's Overall Health Status Rating: (!) fair Typical amount of pain: some (back pain - rating 5) Does pain affect daily life?: (!) yes Are you currently prescribed opioids?: no  Dietary Habits and Nutritional Risks How many meals a day?: 3 (2-3) Eats fruit and vegetables daily?: yes Most meals are obtained by: preparing own meals; eating out In the last 2 weeks, have you had any of the following?: none Diabetic:: no  Functional Status Activities of Daily Living (to include ambulation/medication): Independent Ambulation: Independent with device- listed below Home Assistive Devices/Equipment: Eyeglasses; Cane Medication Administration: Independent Home Management (perform basic housework or laundry): Independent Manage your own finances?: yes Primary transportation is: driving; family / friends Concerns about vision?: no *vision screening is  required for WTM* Concerns about hearing?: no  Fall Screening Falls in the past year?: 1 Number of falls in past year: 0 (1) Was there an injury with Fall?: 0 Fall Risk Category Calculator: 1 Patient Fall Risk Level: Low Fall Risk  Fall Risk Patient at Risk for Falls Due to: History of fall(s); Impaired balance/gait; Other (Comment) (tripped and fell) Fall risk Follow up: Falls evaluation completed; Falls prevention discussed  Home and Transportation Safety: All rugs have non-skid backing?: N/A, no rugs All stairs or steps have railings?: yes Grab bars in the bathtub or shower?: yes Have non-skid surface in bathtub or shower?: yes Good home lighting?: yes Regular seat belt use?: yes Hospital stays in the last year:: no  Cognitive Assessment Difficulty concentrating, remembering, or making decisions? : no Will 6CIT or Mini Cog be Completed: yes What year is it?: 0 points What month is it?: 0 points Give patient an address phrase to remember (5 components): [address] [place], [place] About what time is it?: 0 points Count backwards from 20 to 1: 0 points Say the months of the year in reverse: 2 points (August) Repeat the address phrase from earlier: 0 points 6 CIT Score: 2 points  Advance Directives (For Healthcare) Does Patient Have a Medical Advance Directive?: No Would patient like information on creating a medical advance directive?: Yes (MAU/Ambulatory/Procedural Areas - Information given)  Reviewed/Updated  Reviewed/Updated: Reviewed All (Medical, Surgical, Family, Medications, Allergies, Care Teams, Patient Goals)    Allergies (verified) Codeine   Current Medications (verified) Outpatient Encounter Medications as of 12/09/2024  Medication Sig   albuterol  (VENTOLIN  HFA) 108 (90 Base) MCG/ACT inhaler Inhale 2 puffs into the lungs every 6 (six) hours as needed for wheezing or shortness of breath.   ALPRAZolam  (XANAX ) 0.25 MG tablet Take 1 tablet (0.25 mg total)  by mouth 2 (two)  times daily as needed. for anxiety   aspirin 81 MG tablet Take 81 mg by mouth daily.   Cholecalciferol (VITAMIN D3) 1000 UNITS CAPS Take 5,000 Units by mouth daily.   ibuprofen (ADVIL,MOTRIN) 200 MG tablet Take 400 mg by mouth daily.   levothyroxine  (SYNTHROID ) 50 MCG tablet TAKE 1 TABLET BY MOUTH ONCE DAILY   losartan  (COZAAR ) 50 MG tablet Take 1 tablet (50 mg total) by mouth daily.   meclizine  (ANTIVERT ) 12.5 MG tablet Take 1 tablet (12.5 mg total) by mouth 3 (three) times daily as needed.   metoprolol  succinate (TOPROL  XL) 25 MG 24 hr tablet Take 1 tablet (25 mg total) by mouth daily.   polyethylene glycol (MIRALAX  / GLYCOLAX ) packet Take 17 g by mouth daily as needed. For constipation   potassium chloride  (KLOR-CON ) 10 MEQ tablet Take 2 tablets by mouth once daily   rosuvastatin  (CRESTOR ) 40 MG tablet Take 1 tablet (40 mg total) by mouth daily.   triamcinolone  (NASACORT ) 55 MCG/ACT AERO nasal inhaler Place 2 sprays into the nose daily.   venlafaxine  XR (EFFEXOR -XR) 150 MG 24 hr capsule Take 1 capsule (150 mg total) by mouth daily with breakfast.   traMADol  (ULTRAM ) 50 MG tablet Take 1 tablet (50 mg total) by mouth every 6 (six) hours as needed. (Patient not taking: Reported on 12/09/2024)   No facility-administered encounter medications on file as of 12/09/2024.    History: Past Medical History:  Diagnosis Date   Anemia    ANXIETY    BACK PAIN    Cataract    CONSTIPATION, CHRONIC    DEPRESSION    GERD (gastroesophageal reflux disease)    HYPERLIPIDEMIA    HYPERTENSION    NASH (NON-ALCOHOLIC STEATOHEPATITIS)    Obesity, unspecified    OSTEOPENIA    Pneumonia    x3   Past Surgical History:  Procedure Laterality Date   ABDOMINAL HYSTERECTOMY     APPENDECTOMY     BLADDER SUSPENSION  01/18/2012   Procedure: TRANSVAGINAL TAPE (TVT) PROCEDURE;  Surgeon: Rosaline DELENA Luna, MD;  Location: [facility];  Service: Gynecology;  Laterality: N/A;   BREAST BIOPSY Right     CATARACT EXTRACTION     both eyes   cataract surgery     left and right eye   CHOLECYSTECTOMY     COLONOSCOPY W/ BIOPSIES AND POLYPECTOMY  01/03/2003   submucosal lesion   CYSTOCELE REPAIR  01/18/2012   Procedure: ANTERIOR REPAIR (CYSTOCELE);  Surgeon: Rosaline DELENA Luna, MD;  Location: [facility];  Service: Gynecology;  Laterality: N/A;   CYSTOSCOPY  01/18/2012   Procedure: CYSTOSCOPY;  Surgeon: Rosaline DELENA Luna, MD;  Location: [facility];  Service: Gynecology;  Laterality: N/A;   ELECTROCARDIOGRAM  07/27/2006   s/p liver biopsy     Dr. Avram   s/p lumbar surgery     trigger finger repair left thumb     TUBAL LIGATION     UPPER GASTROINTESTINAL ENDOSCOPY  01/03/2003   Family History  Problem Relation Age of Onset   Heart disease Father    Alcohol abuse Other    Hyperlipidemia Other    Hypertension Other    Colon polyps Neg Hx    Esophageal cancer Neg Hx    Stomach cancer Neg Hx    Rectal cancer Neg Hx    Breast cancer Neg Hx    Social History   Occupational History   Occupation: disabled systems developer  Tobacco Use   Smoking status: Former    Current packs/day:  0.00    Types: Cigarettes    Quit date: 01/08/2001    Years since quitting: 23.9   Smokeless tobacco: Never  Vaping Use   Vaping status: Never Used  Substance and Sexual Activity   Alcohol use: Not Currently    Alcohol/week: 1.0 standard drink of alcohol    Types: 1 Shots of liquor per week    Comment: occasion 3-4 x yr   Drug use: No   Sexual activity: Yes   Tobacco Counseling Counseling given: Yes  SDOH Screenings   Food Insecurity: No Food Insecurity (12/09/2024)  Housing: Unknown (12/09/2024)  Transportation Needs: No Transportation Needs (12/09/2024)  Utilities: Not At Risk (12/09/2024)  Alcohol Screen: Low Risk (12/08/2023)  Depression (PHQ2-9): Low Risk (12/09/2024)  Financial Resource Strain: Low Risk (12/08/2023)  Physical Activity: Inactive (12/09/2024)  Social Connections: Moderately Isolated  (12/09/2024)  Stress: No Stress Concern Present (12/09/2024)  Tobacco Use: Medium Risk (12/09/2024)  Health Literacy: Adequate Health Literacy (12/09/2024)   See flowsheets for full screening details  Depression Screen PHQ 2 & 9 Depression Scale- Over the past 2 weeks, how often have you been bothered by any of the following problems? Little interest or pleasure in doing things: 0 Feeling down, depressed, or hopeless (PHQ Adolescent also includes...irritable): 0 PHQ-2 Total Score: 0 Trouble falling or staying asleep, or sleeping too much: 0 Feeling tired or having little energy: 0 Poor appetite or overeating (PHQ Adolescent also includes...weight loss): 0 Feeling bad about yourself - or that you are a failure or have let yourself or your family down: 0 Trouble concentrating on things, such as reading the newspaper or watching television (PHQ Adolescent also includes...like school work): 0 Moving or speaking so slowly that other people could have noticed. Or the opposite - being so fidgety or restless that you have been moving around a lot more than usual: 0 Thoughts that you would be better off dead, or of hurting yourself in some way: 0 PHQ-9 Total Score: 0 If you checked off any problems, how difficult have these problems made it for you to do your work, take care of things at home, or get along with other people?: Not difficult at all  Depression Treatment Depression Interventions/Treatment : EYV7-0 Score <4 Follow-up Not Indicated; Medication; Currently on Treatment     Goals Addressed               This Visit's Progress     Patient Stated (pt-stated)        Patient stated she plans to lose weight - wants to lose about 50-55lbs             Objective:    Today's Vitals   12/09/24 1439  Weight: 199 lb (90.3 kg)  Height: 5' 3 (1.6 m)   Body mass index is 35.25 kg/m.  Hearing/Vision screen Hearing Screening - Comments:: Denies hearing difficulties   Vision  Screening - Comments:: Wears rx glasses - up to date with routine eye exams with  Immunizations and Health Maintenance Health Maintenance  Topic Date Due   Influenza Vaccine  07/19/2024   Medicare Annual Wellness (AWV)  12/09/2025   Mammogram  05/17/2026   DTaP/Tdap/Td (3 - Td or Tdap) 04/08/2031   Pneumococcal Vaccine: 50+ Years  Completed   Bone Density Scan  Completed   Hepatitis C Screening  Completed   Zoster Vaccines- Shingrix  Completed   Meningococcal B Vaccine  Aged Out   Colonoscopy  Discontinued   COVID-19 Vaccine  Discontinued  Assessment/Plan:  This is a routine wellness examination for Kayti.  Patient Care Team: Norleen Lynwood ORN, MD as PCP - Diedre Waylan Cain, MD as Consulting Physician (Ophthalmology)  I have personally reviewed and noted the following in the patients chart:   Medical and social history Use of alcohol, tobacco or illicit drugs  Current medications and supplements including opioid prescriptions. Functional ability and status Nutritional status Physical activity Advanced directives List of other physicians Hospitalizations, surgeries, and ER visits in previous 12 months Vitals Screenings to include cognitive, depression, and falls Referrals and appointments  No orders of the defined types were placed in this encounter.  In addition, I have reviewed and discussed with patient certain preventive protocols, quality metrics, and best practice recommendations. A written personalized care plan for preventive services as well as general preventive health recommendations were provided to patient.   Verdie CHRISTELLA Saba, CMA   12/09/2024   Return in 1 year (on 12/09/2025).  After Visit Summary: (MyChart) Due to this being a telephonic visit, the after visit summary with patients personalized plan was offered to patient via MyChart   Nurse Notes: scheduled 2026 AWV appt (plans to get Influenza vaccine at that time). "

## 2024-12-10 ENCOUNTER — Telehealth: Payer: Self-pay | Admitting: Internal Medicine

## 2024-12-17 ENCOUNTER — Ambulatory Visit (INDEPENDENT_AMBULATORY_CARE_PROVIDER_SITE_OTHER): Admitting: Internal Medicine

## 2024-12-17 ENCOUNTER — Encounter: Payer: Self-pay | Admitting: Internal Medicine

## 2024-12-17 VITALS — BP 128/76 | HR 70 | Temp 98.1°F | Ht 63.0 in | Wt 200.0 lb

## 2024-12-17 DIAGNOSIS — I10 Essential (primary) hypertension: Secondary | ICD-10-CM | POA: Diagnosis not present

## 2024-12-17 DIAGNOSIS — R413 Other amnesia: Secondary | ICD-10-CM | POA: Diagnosis not present

## 2024-12-17 DIAGNOSIS — Z23 Encounter for immunization: Secondary | ICD-10-CM | POA: Diagnosis not present

## 2024-12-17 DIAGNOSIS — N1831 Chronic kidney disease, stage 3a: Secondary | ICD-10-CM | POA: Diagnosis not present

## 2024-12-17 DIAGNOSIS — E039 Hypothyroidism, unspecified: Secondary | ICD-10-CM

## 2024-12-17 DIAGNOSIS — E559 Vitamin D deficiency, unspecified: Secondary | ICD-10-CM | POA: Diagnosis not present

## 2024-12-17 DIAGNOSIS — E785 Hyperlipidemia, unspecified: Secondary | ICD-10-CM | POA: Diagnosis not present

## 2024-12-17 DIAGNOSIS — R739 Hyperglycemia, unspecified: Secondary | ICD-10-CM

## 2024-12-17 LAB — BASIC METABOLIC PANEL WITH GFR
BUN: 13 mg/dL (ref 6–23)
CO2: 30 meq/L (ref 19–32)
Calcium: 9.8 mg/dL (ref 8.4–10.5)
Chloride: 104 meq/L (ref 96–112)
Creatinine, Ser: 1.07 mg/dL (ref 0.40–1.20)
GFR: 51.19 mL/min — ABNORMAL LOW
Glucose, Bld: 77 mg/dL (ref 70–99)
Potassium: 4 meq/L (ref 3.5–5.1)
Sodium: 142 meq/L (ref 135–145)

## 2024-12-17 LAB — HEPATIC FUNCTION PANEL
ALT: 24 U/L (ref 3–35)
AST: 32 U/L (ref 5–37)
Albumin: 4.1 g/dL (ref 3.5–5.2)
Alkaline Phosphatase: 105 U/L (ref 39–117)
Bilirubin, Direct: 0.1 mg/dL (ref 0.1–0.3)
Total Bilirubin: 0.6 mg/dL (ref 0.2–1.2)
Total Protein: 7.2 g/dL (ref 6.0–8.3)

## 2024-12-17 LAB — CBC WITH DIFFERENTIAL/PLATELET
Basophils Absolute: 0 K/uL (ref 0.0–0.1)
Basophils Relative: 0.4 % (ref 0.0–3.0)
Eosinophils Absolute: 0.4 K/uL (ref 0.0–0.7)
Eosinophils Relative: 6 % — ABNORMAL HIGH (ref 0.0–5.0)
HCT: 45 % (ref 36.0–46.0)
Hemoglobin: 15.2 g/dL — ABNORMAL HIGH (ref 12.0–15.0)
Lymphocytes Relative: 39.8 % (ref 12.0–46.0)
Lymphs Abs: 2.4 K/uL (ref 0.7–4.0)
MCHC: 33.8 g/dL (ref 30.0–36.0)
MCV: 93.4 fl (ref 78.0–100.0)
Monocytes Absolute: 0.4 K/uL (ref 0.1–1.0)
Monocytes Relative: 6.7 % (ref 3.0–12.0)
Neutro Abs: 2.9 K/uL (ref 1.4–7.7)
Neutrophils Relative %: 47.1 % (ref 43.0–77.0)
Platelets: 220 K/uL (ref 150.0–400.0)
RBC: 4.81 Mil/uL (ref 3.87–5.11)
RDW: 13.8 % (ref 11.5–15.5)
WBC: 6.1 K/uL (ref 4.0–10.5)

## 2024-12-17 LAB — LIPID PANEL
Cholesterol: 291 mg/dL — ABNORMAL HIGH (ref 28–200)
HDL: 46.6 mg/dL
LDL Cholesterol: 190 mg/dL — ABNORMAL HIGH (ref 10–99)
NonHDL: 244.47
Total CHOL/HDL Ratio: 6
Triglycerides: 271 mg/dL — ABNORMAL HIGH (ref 10.0–149.0)
VLDL: 54.2 mg/dL — ABNORMAL HIGH (ref 0.0–40.0)

## 2024-12-17 LAB — VITAMIN D 25 HYDROXY (VIT D DEFICIENCY, FRACTURES): VITD: 24.13 ng/mL — ABNORMAL LOW (ref 30.00–100.00)

## 2024-12-17 LAB — HEMOGLOBIN A1C: Hgb A1c MFr Bld: 5.1 % (ref 4.6–6.5)

## 2024-12-17 MED ORDER — POTASSIUM CHLORIDE ER 10 MEQ PO TBCR: 20.0000 meq | EXTENDED_RELEASE_TABLET | Freq: Every day | ORAL | 3 refills | Status: AC

## 2024-12-17 MED ORDER — LOSARTAN POTASSIUM 50 MG PO TABS: 50.0000 mg | ORAL_TABLET | Freq: Every day | ORAL | 3 refills | Status: AC

## 2024-12-17 MED ORDER — METOPROLOL SUCCINATE ER 25 MG PO TB24: 25.0000 mg | ORAL_TABLET | Freq: Every day | ORAL | 3 refills | Status: AC

## 2024-12-17 MED ORDER — LEVOTHYROXINE SODIUM 50 MCG PO TABS: ORAL_TABLET | ORAL | 3 refills | Status: AC

## 2024-12-17 MED ORDER — VENLAFAXINE HCL ER 150 MG PO CP24: 150.0000 mg | ORAL_CAPSULE | Freq: Every day | ORAL | 3 refills | Status: AC

## 2024-12-17 MED ORDER — ROSUVASTATIN CALCIUM 40 MG PO TABS: 40.0000 mg | ORAL_TABLET | Freq: Every day | ORAL | 3 refills | Status: AC

## 2024-12-17 NOTE — Assessment & Plan Note (Signed)
 With mild worsening, pt declines neurology referral for now or other tx

## 2024-12-17 NOTE — Progress Notes (Signed)
 Patient ID: Elizabeth Buckley, female   DOB: September 27, 1951, 73 y.o.   MRN: [id number]        Chief Complaint: follow up memory change, low thyroid , hld, hyperglycemia, htn, ckd3a       HPI:  Elizabeth Buckley is a 73 y.o. female here overall doing ok, and Pt denies chest pain, increased sob or doe, wheezing, orthopnea, PND, increased LE swelling, palpitations, dizziness or syncope.   Pt denies polydipsia, polyuria, or new focal neuro s/s.   Also with memory changed short term related with ? Mild recent worsening.   Due for flu shot.  To see optho next month.  Wt Readings from Last 3 Encounters:  12/17/24 200 lb (90.7 kg)  12/09/24 199 lb (90.3 kg)  06/06/24 199 lb (90.3 kg)   BP Readings from Last 3 Encounters:  12/17/24 128/76  06/06/24 128/72  05/17/24 (!) 156/93         Past Medical History:  Diagnosis Date   Anemia    ANXIETY    BACK PAIN    Cataract    CONSTIPATION, CHRONIC    DEPRESSION    GERD (gastroesophageal reflux disease)    HYPERLIPIDEMIA    HYPERTENSION    NASH (NON-ALCOHOLIC STEATOHEPATITIS)    Obesity, unspecified    OSTEOPENIA    Pneumonia    x3   Past Surgical History:  Procedure Laterality Date   ABDOMINAL HYSTERECTOMY     APPENDECTOMY     BLADDER SUSPENSION  01/18/2012   Procedure: TRANSVAGINAL TAPE (TVT) PROCEDURE;  Surgeon: Rosaline DELENA Luna, MD;  Location: [facility];  Service: Gynecology;  Laterality: N/A;   BREAST BIOPSY Right    CATARACT EXTRACTION     both eyes   cataract surgery     left and right eye   CHOLECYSTECTOMY     COLONOSCOPY W/ BIOPSIES AND POLYPECTOMY  01/03/2003   submucosal lesion   CYSTOCELE REPAIR  01/18/2012   Procedure: ANTERIOR REPAIR (CYSTOCELE);  Surgeon: Rosaline DELENA Luna, MD;  Location: [facility];  Service: Gynecology;  Laterality: N/A;   CYSTOSCOPY  01/18/2012   Procedure: CYSTOSCOPY;  Surgeon: Rosaline DELENA Luna, MD;  Location: [facility];  Service: Gynecology;  Laterality: N/A;   ELECTROCARDIOGRAM  07/27/2006   s/p liver biopsy      Dr. Avram   s/p lumbar surgery     trigger finger repair left thumb     TUBAL LIGATION     UPPER GASTROINTESTINAL ENDOSCOPY  01/03/2003    reports that she quit smoking about 23 years ago. Her smoking use included cigarettes. She has never used smokeless tobacco. She reports that she does not currently use alcohol after a past usage of about 1.0 standard drink of alcohol per week. She reports that she does not use drugs. family history includes Alcohol abuse in an other family member; Heart disease in her father; Hyperlipidemia in an other family member; Hypertension in an other family member. Allergies[1] Medications Ordered Prior to Encounter[2]      ROS:  All others reviewed and negative.  Objective        PE:  BP 128/76 (BP Location: Right Arm, Patient Position: Sitting, Cuff Size: Normal)   Pulse 70   Temp 98.1 F (36.7 C) (Oral)   Ht 5' 3 (1.6 m)   Wt 200 lb (90.7 kg)   SpO2 95%   BMI 35.43 kg/m                 Constitutional: Pt appears  in NAD               HENT: Head: NCAT.                Right Ear: External ear normal.                 Left Ear: External ear normal.                Eyes: . Pupils are equal, round, and reactive to light. Conjunctivae and EOM are normal               Nose: without d/c or deformity               Neck: Neck supple. Gross normal ROM               Cardiovascular: Normal rate and regular rhythm.                 Pulmonary/Chest: Effort normal and breath sounds without rales or wheezing.                Abd:  Soft, NT, ND, + BS, no organomegaly               Neurological: Pt is alert. At baseline orientation, motor grossly intact               Skin: Skin is warm. No rashes, no other new lesions, LE edema - none               Psychiatric: Pt behavior is normal without agitation   Micro: none  Cardiac tracings I have personally interpreted today:  none  Pertinent Radiological findings (summarize): none   Lab Results  Component Value Date    WBC 7.4 06/06/2024   HGB 15.4 (H) 06/06/2024   HCT 45.1 06/06/2024   PLT 251.0 06/06/2024   GLUCOSE 110 (H) 06/06/2024   CHOL 272 (H) 06/06/2024   TRIG 243.0 (H) 06/06/2024   HDL 45.50 06/06/2024   LDLDIRECT 200.0 11/15/2023   LDLCALC 178 (H) 06/06/2024   ALT 26 06/06/2024   AST 36 06/06/2024   NA 139 06/06/2024   K 3.6 06/06/2024   CL 104 06/06/2024   CREATININE 0.98 06/06/2024   BUN 12 06/06/2024   CO2 29 06/06/2024   TSH 3.69 06/06/2024   INR 1.0 11/10/2008   HGBA1C 5.2 06/06/2024   Assessment/Plan:  Elizabeth Buckley is a 73 y.o. White or Caucasian [1] female with  has a past medical history of Anemia, ANXIETY, BACK PAIN, Cataract, CONSTIPATION, CHRONIC, DEPRESSION, GERD (gastroesophageal reflux disease), HYPERLIPIDEMIA, HYPERTENSION, NASH (NON-ALCOHOLIC STEATOHEPATITIS), Obesity, unspecified, OSTEOPENIA, and Pneumonia.  Memory change With mild worsening, pt declines neurology referral for now or other tx  Hypothyroidism Lab Results  Component Value Date   TSH 3.69 06/06/2024   Stable, pt to continue levothyroxine  50 mcg qd   Hyperlipidemia Lab Results  Component Value Date   LDLCALC 178 (H) 06/06/2024   Severe uncontrolled, pt to continue current statin crestor  40 mg every day and lower chol diet, declines other change for now   Hyperglycemia Lab Results  Component Value Date   HGBA1C 5.2 06/06/2024   Stable, pt to continue current medical treatment  - diet, wt control   Essential hypertension BP Readings from Last 3 Encounters:  12/17/24 128/76  06/06/24 128/72  05/17/24 (!) 156/93   Stable, pt to continue medical treatment losartan  50 mg every day, toprol  xl 25 mg qd  CKD (chronic kidney disease) Ckd3a  Lab Results  Component Value Date   CREATININE 0.98 06/06/2024   Stable overall, cont to avoid nephrotoxins  Followup: Return in about 6 months (around 06/17/2025).  Lynwood Rush, MD 12/17/2024 12:19 PM [facility]  Primary Care - [facility]     [1]  Allergies Allergen Reactions   Codeine Nausea Only  [2]  Current Outpatient Medications on File Prior to Visit  Medication Sig Dispense Refill   albuterol  (VENTOLIN  HFA) 108 (90 Base) MCG/ACT inhaler Inhale 2 puffs into the lungs every 6 (six) hours as needed for wheezing or shortness of breath. 1 each 2   ALPRAZolam  (XANAX ) 0.25 MG tablet Take 1 tablet (0.25 mg total) by mouth 2 (two) times daily as needed. for anxiety 60 tablet 2   aspirin 81 MG tablet Take 81 mg by mouth daily.     Cholecalciferol (VITAMIN D3) 1000 UNITS CAPS Take 5,000 Units by mouth daily.     ibuprofen (ADVIL,MOTRIN) 200 MG tablet Take 400 mg by mouth daily.     meclizine  (ANTIVERT ) 12.5 MG tablet Take 1 tablet (12.5 mg total) by mouth 3 (three) times daily as needed. 30 tablet 2   polyethylene glycol (MIRALAX  / GLYCOLAX ) packet Take 17 g by mouth daily as needed. For constipation 14 each 6   traMADol  (ULTRAM ) 50 MG tablet Take 1 tablet (50 mg total) by mouth every 6 (six) hours as needed. 30 tablet 2   triamcinolone  (NASACORT ) 55 MCG/ACT AERO nasal inhaler Place 2 sprays into the nose daily. 1 each 12   No current facility-administered medications on file prior to visit.

## 2024-12-17 NOTE — Assessment & Plan Note (Signed)
 BP Readings from Last 3 Encounters:  12/17/24 128/76  06/06/24 128/72  05/17/24 (!) 156/93   Stable, pt to continue medical treatment losartan  50 mg every day, toprol  xl 25 mg qd

## 2024-12-17 NOTE — Assessment & Plan Note (Signed)
 Lab Results  Component Value Date   TSH 3.69 06/06/2024   Stable, pt to continue levothyroxine  50 mcg qd

## 2024-12-17 NOTE — Assessment & Plan Note (Signed)
 Lab Results  Component Value Date   HGBA1C 5.2 06/06/2024   Stable, pt to continue current medical treatment  - diet, wt control

## 2024-12-17 NOTE — Patient Instructions (Signed)
 You had the flu shot today  Please continue all other medications as before, and refills have been done if requested.  Please have the pharmacy call with any other refills you may need.  Please continue your efforts at being more active, low cholesterol diet, and weight control.  You are otherwise up to date with prevention measures today.  Please keep your appointments with your specialists as you may have planned - eye doctor soon  Please go to the LAB at the blood drawing area for the tests to be done  You will be contacted by phone if any changes need to be made immediately.  Otherwise, you will receive a letter about your results with an explanation, but please check with MyChart first.  Please make an Appointment to return in 6 months, or sooner if needed

## 2024-12-17 NOTE — Assessment & Plan Note (Signed)
 Ckd3a  Lab Results  Component Value Date   CREATININE 0.98 06/06/2024   Stable overall, cont to avoid nephrotoxins

## 2024-12-17 NOTE — Assessment & Plan Note (Signed)
 Lab Results  Component Value Date   LDLCALC 178 (H) 06/06/2024   Severe uncontrolled, pt to continue current statin crestor  40 mg every day and lower chol diet, declines other change for now

## 2024-12-18 ENCOUNTER — Ambulatory Visit: Payer: Self-pay | Admitting: Internal Medicine

## 2025-12-15 ENCOUNTER — Ambulatory Visit
